# Patient Record
Sex: Male | Born: 1940 | Race: White | Hispanic: No | Marital: Married | Smoking: Current every day smoker
Health system: Southern US, Community
[De-identification: ages and names within clinical notes are randomized; demographics above are authoritative.]

## PROBLEM LIST (undated history)

## (undated) DIAGNOSIS — I219 Acute myocardial infarction, unspecified: Secondary | ICD-10-CM

## (undated) DIAGNOSIS — E119 Type 2 diabetes mellitus without complications: Secondary | ICD-10-CM

## (undated) DIAGNOSIS — J449 Chronic obstructive pulmonary disease, unspecified: Secondary | ICD-10-CM

## (undated) DIAGNOSIS — I10 Essential (primary) hypertension: Secondary | ICD-10-CM

## (undated) DIAGNOSIS — I509 Heart failure, unspecified: Secondary | ICD-10-CM

## (undated) DIAGNOSIS — D49519 Neoplasm of unspecified behavior of unspecified kidney: Secondary | ICD-10-CM

## (undated) DIAGNOSIS — E7800 Pure hypercholesterolemia, unspecified: Secondary | ICD-10-CM

## (undated) DIAGNOSIS — K5792 Diverticulitis of intestine, part unspecified, without perforation or abscess without bleeding: Secondary | ICD-10-CM

## (undated) DIAGNOSIS — Z5189 Encounter for other specified aftercare: Secondary | ICD-10-CM

## (undated) DIAGNOSIS — I2510 Atherosclerotic heart disease of native coronary artery without angina pectoris: Secondary | ICD-10-CM

## (undated) DIAGNOSIS — IMO0001 Reserved for inherently not codable concepts without codable children: Secondary | ICD-10-CM

## (undated) DIAGNOSIS — I214 Non-ST elevation (NSTEMI) myocardial infarction: Secondary | ICD-10-CM

## (undated) DIAGNOSIS — I499 Cardiac arrhythmia, unspecified: Secondary | ICD-10-CM

## (undated) DIAGNOSIS — C801 Malignant (primary) neoplasm, unspecified: Secondary | ICD-10-CM

## (undated) DIAGNOSIS — IMO0002 Reserved for concepts with insufficient information to code with codable children: Secondary | ICD-10-CM

## (undated) DIAGNOSIS — N2889 Other specified disorders of kidney and ureter: Secondary | ICD-10-CM

## (undated) DIAGNOSIS — M1990 Unspecified osteoarthritis, unspecified site: Secondary | ICD-10-CM

## (undated) HISTORY — PX: HERNIA REPAIR: SHX51

## (undated) HISTORY — DX: Chronic obstructive pulmonary disease, unspecified: J44.9

## (undated) HISTORY — DX: Essential (primary) hypertension: I10

## (undated) HISTORY — PX: CATARACT EXTRACTION W/ INTRAOCULAR LENS  IMPLANT, BILATERAL: SHX1307

## (undated) HISTORY — DX: Diverticulitis of intestine, part unspecified, without perforation or abscess without bleeding: K57.92

## (undated) HISTORY — DX: Atherosclerotic heart disease of native coronary artery without angina pectoris: I25.10

## (undated) HISTORY — DX: Non-ST elevation (NSTEMI) myocardial infarction: I21.4

## (undated) HISTORY — PX: COLON SURGERY: SHX602

## (undated) HISTORY — DX: Other specified disorders of kidney and ureter: N28.89

## (undated) HISTORY — PX: APPENDECTOMY: SHX54

---

## 1959-05-15 DIAGNOSIS — IMO0002 Reserved for concepts with insufficient information to code with codable children: Secondary | ICD-10-CM

## 1959-05-15 HISTORY — DX: Reserved for concepts with insufficient information to code with codable children: IMO0002

## 1994-09-13 DIAGNOSIS — K5792 Diverticulitis of intestine, part unspecified, without perforation or abscess without bleeding: Secondary | ICD-10-CM

## 1994-09-13 HISTORY — DX: Diverticulitis of intestine, part unspecified, without perforation or abscess without bleeding: K57.92

## 1998-06-03 ENCOUNTER — Encounter: Payer: Self-pay | Admitting: Surgery

## 1998-06-06 ENCOUNTER — Ambulatory Visit (HOSPITAL_COMMUNITY): Admission: RE | Admit: 1998-06-06 | Discharge: 1998-06-06 | Payer: Self-pay | Admitting: Surgery

## 2001-01-13 ENCOUNTER — Encounter (INDEPENDENT_AMBULATORY_CARE_PROVIDER_SITE_OTHER): Payer: Self-pay | Admitting: Specialist

## 2001-01-13 ENCOUNTER — Ambulatory Visit (HOSPITAL_COMMUNITY): Admission: RE | Admit: 2001-01-13 | Discharge: 2001-01-13 | Payer: Self-pay | Admitting: Gastroenterology

## 2003-09-14 HISTORY — PX: COLON RESECTION: SHX5231

## 2005-09-13 ENCOUNTER — Emergency Department (HOSPITAL_COMMUNITY): Admission: EM | Admit: 2005-09-13 | Discharge: 2005-09-13 | Payer: Self-pay | Admitting: Emergency Medicine

## 2006-02-28 ENCOUNTER — Encounter: Admission: RE | Admit: 2006-02-28 | Discharge: 2006-02-28 | Payer: Self-pay | Admitting: Family Medicine

## 2010-02-09 ENCOUNTER — Emergency Department (HOSPITAL_COMMUNITY): Admission: EM | Admit: 2010-02-09 | Discharge: 2010-02-09 | Payer: Self-pay | Admitting: Emergency Medicine

## 2010-02-18 ENCOUNTER — Ambulatory Visit (HOSPITAL_COMMUNITY): Admission: RE | Admit: 2010-02-18 | Discharge: 2010-02-18 | Payer: Self-pay | Admitting: Family Medicine

## 2010-10-04 ENCOUNTER — Encounter: Payer: Self-pay | Admitting: Family Medicine

## 2010-11-30 LAB — URINALYSIS, ROUTINE W REFLEX MICROSCOPIC
Bilirubin Urine: NEGATIVE
Glucose, UA: NEGATIVE mg/dL
Nitrite: NEGATIVE
Protein, ur: NEGATIVE mg/dL
Urobilinogen, UA: 0.2 mg/dL (ref 0.0–1.0)

## 2010-11-30 LAB — CBC
HCT: 50.7 % (ref 39.0–52.0)
Hemoglobin: 17.8 g/dL — ABNORMAL HIGH (ref 13.0–17.0)
MCHC: 35 g/dL (ref 30.0–36.0)
MCV: 93.8 fL (ref 78.0–100.0)
Platelets: 141 10*3/uL — ABNORMAL LOW (ref 150–400)
RBC: 5.41 MIL/uL (ref 4.22–5.81)
RDW: 13.6 % (ref 11.5–15.5)
WBC: 7.3 10*3/uL (ref 4.0–10.5)

## 2010-11-30 LAB — POCT I-STAT, CHEM 8
BUN: 15 mg/dL (ref 6–23)
Calcium, Ion: 1.13 mmol/L (ref 1.12–1.32)
TCO2: 28 mmol/L (ref 0–100)

## 2011-01-29 NOTE — Procedures (Signed)
[place]. [facility]  Patient:    [name], [name]                       MRN: [id number] Proc. Date: 01/13/01 Adm. Date:  [id number] Attending:  Nelda Marseille CC:         Abran Cantor. Clovis Riley, M.D.   Procedure Report  PROCEDURE:  Colonoscopy with biopsy.  INDICATION:  Patient with history of diverticular surgery and bleeding in the past, due for repeat screening.  Consent was signed after risks, benefits, methods, and options thoroughly discussed multiple times in the past.  MEDICATIONS:  Demerol 100 mg, Versed 10 mg.  DESCRIPTION OF PROCEDURE:  Rectal inspection was pertinent for small external hemorrhoids.  Digital exam was negative.  Video pediatric colonoscope was inserted and easily advanced around the colon to the anastomosis and a short way into the terminal ileum.  It was a side-to-side anastomosis.  Other than significant diverticula throughout the colon, no abnormalities were seen on insertion.  The scope was slowly withdrawn.  The prep was adequate.  There was some liquid stool that required washing and suctioning but on slow withdrawal, other than pan-diverticulosis, no abnormalities were seen until back in the rectum a small polyp was seen on retroflexion, which was cold biopsied x 1. The scope was straightened.  No additional findings were seen.  Air was suctioned, scope removed.  The patient tolerated the procedure well.  There was obvious of immediate complications.  ENDOSCOPIC DIAGNOSES: 1. Internal-external hemorrhoids. 2. Tiny rectal polyp, cold biopsied on retroflexion. 3. Pan-diverticulosis. 4. Side-to-side anastomosis of the colon with normal exam to the terminal    ileum.  PLAN:  Await pathology, but probably repeat screening in five to 10 years.  GI follow-up p.r.n., otherwise return care to Dr. Clovis Riley for the customary health care maintenance to include yearly rectals and guaiacs. DD:  01/13/01 TD:  01/14/01 Job:  [id number]

## 2011-06-14 HISTORY — PX: CORONARY ARTERY BYPASS GRAFT: SHX141

## 2011-06-16 ENCOUNTER — Other Ambulatory Visit: Payer: Self-pay | Admitting: Family Medicine

## 2011-06-16 ENCOUNTER — Ambulatory Visit
Admission: RE | Admit: 2011-06-16 | Discharge: 2011-06-16 | Disposition: A | Discharge status: Home / Self Care | Payer: Medicare Other | Source: Ambulatory Visit | Attending: Family Medicine | Admitting: Family Medicine

## 2011-06-16 DIAGNOSIS — R079 Chest pain, unspecified: Secondary | ICD-10-CM

## 2011-06-19 ENCOUNTER — Observation Stay (HOSPITAL_COMMUNITY)
Admission: EM | Admit: 2011-06-19 | Discharge: 2011-06-20 | Disposition: A | Discharge status: Nursing Home (Includes ICF) | Payer: Medicare Other | Source: Home / Self Care | Attending: Emergency Medicine | Admitting: Emergency Medicine

## 2011-06-19 ENCOUNTER — Emergency Department (HOSPITAL_COMMUNITY): Payer: Medicare Other

## 2011-06-19 DIAGNOSIS — Z7982 Long term (current) use of aspirin: Secondary | ICD-10-CM | POA: Insufficient documentation

## 2011-06-19 DIAGNOSIS — I219 Acute myocardial infarction, unspecified: Secondary | ICD-10-CM

## 2011-06-19 DIAGNOSIS — M1990 Unspecified osteoarthritis, unspecified site: Secondary | ICD-10-CM | POA: Insufficient documentation

## 2011-06-19 DIAGNOSIS — R079 Chest pain, unspecified: Secondary | ICD-10-CM | POA: Insufficient documentation

## 2011-06-19 DIAGNOSIS — Z79899 Other long term (current) drug therapy: Secondary | ICD-10-CM | POA: Insufficient documentation

## 2011-06-19 DIAGNOSIS — R7309 Other abnormal glucose: Secondary | ICD-10-CM | POA: Insufficient documentation

## 2011-06-19 DIAGNOSIS — E785 Hyperlipidemia, unspecified: Secondary | ICD-10-CM | POA: Insufficient documentation

## 2011-06-19 DIAGNOSIS — F17200 Nicotine dependence, unspecified, uncomplicated: Secondary | ICD-10-CM | POA: Insufficient documentation

## 2011-06-19 DIAGNOSIS — I10 Essential (primary) hypertension: Secondary | ICD-10-CM | POA: Insufficient documentation

## 2011-06-19 HISTORY — DX: Acute myocardial infarction, unspecified: I21.9

## 2011-06-19 LAB — DIFFERENTIAL
Basophils Absolute: 0 10*3/uL (ref 0.0–0.1)
Basophils Relative: 0 % (ref 0–1)
Eosinophils Absolute: 0 10*3/uL (ref 0.0–0.7)
Eosinophils Relative: 0 % (ref 0–5)
Lymphocytes Relative: 13 % (ref 12–46)
Lymphs Abs: 0.8 10*3/uL (ref 0.7–4.0)
Monocytes Absolute: 0.1 10*3/uL (ref 0.1–1.0)
Monocytes Relative: 2 % — ABNORMAL LOW (ref 3–12)
Neutro Abs: 5.3 10*3/uL (ref 1.7–7.7)
Neutrophils Relative %: 85 % — ABNORMAL HIGH (ref 43–77)

## 2011-06-19 LAB — BASIC METABOLIC PANEL
BUN: 19 mg/dL (ref 6–23)
CO2: 24 mEq/L (ref 19–32)
Calcium: 9.5 mg/dL (ref 8.4–10.5)
Chloride: 98 mEq/L (ref 96–112)
Creatinine, Ser: 1.13 mg/dL (ref 0.50–1.35)
GFR calc Af Amer: 73 mL/min — ABNORMAL LOW (ref >90)
GFR calc non Af Amer: 63 mL/min — ABNORMAL LOW (ref >90)
Glucose, Bld: 316 mg/dL — ABNORMAL HIGH (ref 70–99)
Potassium: 3.9 mEq/L (ref 3.5–5.1)
Sodium: 136 mEq/L (ref 135–145)

## 2011-06-19 LAB — POCT I-STAT TROPONIN I: Troponin i, poc: 0.08 ng/mL (ref 0.00–0.08)

## 2011-06-19 LAB — CBC
HCT: 49.1 % (ref 39.0–52.0)
Hemoglobin: 17.5 g/dL — ABNORMAL HIGH (ref 13.0–17.0)
MCH: 31.5 pg (ref 26.0–34.0)
MCHC: 35.6 g/dL (ref 30.0–36.0)
MCV: 88.3 fL (ref 78.0–100.0)
Platelets: 142 10*3/uL — ABNORMAL LOW (ref 150–400)
RBC: 5.56 MIL/uL (ref 4.22–5.81)
RDW: 13.4 % (ref 11.5–15.5)
WBC: 6.2 10*3/uL (ref 4.0–10.5)

## 2011-06-19 LAB — GLUCOSE, CAPILLARY: Glucose-Capillary: 339 mg/dL — ABNORMAL HIGH (ref 70–99)

## 2011-06-19 MED ORDER — IOHEXOL 300 MG/ML  SOLN
80.0000 mL | Freq: Once | INTRAMUSCULAR | Status: AC | PRN
  Administered 2011-06-19: 80 mL via INTRAVENOUS

## 2011-06-19 MED ORDER — IOHEXOL 300 MG/ML  SOLN
100.0000 mL | Freq: Once | INTRAMUSCULAR | Status: AC | PRN
  Administered 2011-06-19: 100 mL via INTRAVENOUS

## 2011-06-20 ENCOUNTER — Inpatient Hospital Stay (HOSPITAL_COMMUNITY)
Admission: AD | Admit: 2011-06-20 | Discharge: 2011-07-02 | DRG: 233 | Disposition: A | Discharge status: Home / Self Care | Payer: Medicare Other | Source: Other Acute Inpatient Hospital | Attending: Cardiothoracic Surgery | Admitting: Cardiothoracic Surgery

## 2011-06-20 DIAGNOSIS — I2510 Atherosclerotic heart disease of native coronary artery without angina pectoris: Secondary | ICD-10-CM | POA: Diagnosis present

## 2011-06-20 DIAGNOSIS — E669 Obesity, unspecified: Secondary | ICD-10-CM | POA: Diagnosis present

## 2011-06-20 DIAGNOSIS — M1990 Unspecified osteoarthritis, unspecified site: Secondary | ICD-10-CM | POA: Diagnosis present

## 2011-06-20 DIAGNOSIS — Z7982 Long term (current) use of aspirin: Secondary | ICD-10-CM

## 2011-06-20 DIAGNOSIS — A498 Other bacterial infections of unspecified site: Secondary | ICD-10-CM | POA: Diagnosis not present

## 2011-06-20 DIAGNOSIS — A419 Sepsis, unspecified organism: Secondary | ICD-10-CM | POA: Diagnosis not present

## 2011-06-20 DIAGNOSIS — I214 Non-ST elevation (NSTEMI) myocardial infarction: Principal | ICD-10-CM | POA: Diagnosis present

## 2011-06-20 DIAGNOSIS — F17200 Nicotine dependence, unspecified, uncomplicated: Secondary | ICD-10-CM | POA: Diagnosis present

## 2011-06-20 DIAGNOSIS — D696 Thrombocytopenia, unspecified: Secondary | ICD-10-CM | POA: Diagnosis present

## 2011-06-20 DIAGNOSIS — I10 Essential (primary) hypertension: Secondary | ICD-10-CM | POA: Diagnosis present

## 2011-06-20 DIAGNOSIS — E119 Type 2 diabetes mellitus without complications: Secondary | ICD-10-CM | POA: Diagnosis present

## 2011-06-20 DIAGNOSIS — J449 Chronic obstructive pulmonary disease, unspecified: Secondary | ICD-10-CM | POA: Diagnosis present

## 2011-06-20 DIAGNOSIS — E785 Hyperlipidemia, unspecified: Secondary | ICD-10-CM | POA: Diagnosis present

## 2011-06-20 DIAGNOSIS — J4489 Other specified chronic obstructive pulmonary disease: Secondary | ICD-10-CM | POA: Diagnosis present

## 2011-06-20 DIAGNOSIS — D62 Acute posthemorrhagic anemia: Secondary | ICD-10-CM | POA: Diagnosis not present

## 2011-06-20 LAB — LIPID PANEL
Cholesterol: 132 mg/dL (ref 0–200)
LDL Cholesterol: 76 mg/dL (ref 0–99)
Triglycerides: 147 mg/dL (ref <150)

## 2011-06-20 LAB — COMPREHENSIVE METABOLIC PANEL
ALT: 30 U/L (ref 0–53)
Albumin: 3.5 g/dL (ref 3.5–5.2)
Alkaline Phosphatase: 44 U/L (ref 39–117)
Glucose, Bld: 126 mg/dL — ABNORMAL HIGH (ref 70–99)
Potassium: 4.4 mEq/L (ref 3.5–5.1)
Sodium: 136 mEq/L (ref 135–145)
Total Protein: 7.1 g/dL (ref 6.0–8.3)

## 2011-06-20 LAB — TSH
TSH: 0.625 u[IU]/mL (ref 0.350–4.500)
TSH: 1.118 u[IU]/mL (ref 0.350–4.500)

## 2011-06-20 LAB — CBC
Hemoglobin: 15.9 g/dL (ref 13.0–17.0)
MCV: 90.4 fL (ref 78.0–100.0)
Platelets: 120 10*3/uL — ABNORMAL LOW (ref 150–400)
RBC: 4.93 MIL/uL (ref 4.22–5.81)
RBC: 4.88 MIL/uL (ref 4.22–5.81)
RDW: 14.1 % (ref 11.5–15.5)
WBC: 13.5 10*3/uL — ABNORMAL HIGH (ref 4.0–10.5)
WBC: 7.9 10*3/uL (ref 4.0–10.5)

## 2011-06-20 LAB — HEPARIN LEVEL (UNFRACTIONATED): Heparin Unfractionated: 0.59 IU/mL (ref 0.30–0.70)

## 2011-06-20 LAB — URINALYSIS, ROUTINE W REFLEX MICROSCOPIC
Bilirubin Urine: NEGATIVE
Glucose, UA: NEGATIVE mg/dL
Ketones, ur: NEGATIVE mg/dL
Nitrite: NEGATIVE
Protein, ur: NEGATIVE mg/dL
pH: 7.5 (ref 5.0–8.0)

## 2011-06-20 LAB — BASIC METABOLIC PANEL
Calcium: 8.9 mg/dL (ref 8.4–10.5)
GFR calc non Af Amer: 82 mL/min — ABNORMAL LOW (ref >90)
Glucose, Bld: 114 mg/dL — ABNORMAL HIGH (ref 70–99)
Sodium: 139 mEq/L (ref 135–145)

## 2011-06-20 LAB — CARDIAC PANEL(CRET KIN+CKTOT+MB+TROPI)
CK, MB: 55.2 ng/mL (ref 0.3–4.0)
Relative Index: 10.5 — ABNORMAL HIGH (ref 0.0–2.5)
Relative Index: 10.6 — ABNORMAL HIGH (ref 0.0–2.5)
Total CK: 520 U/L — ABNORMAL HIGH (ref 7–232)
Troponin I: 9.72 ng/mL (ref <0.30)

## 2011-06-20 LAB — HEMOGLOBIN A1C: Hgb A1c MFr Bld: 6.6 % — ABNORMAL HIGH (ref <5.7)

## 2011-06-20 LAB — GLUCOSE, CAPILLARY: Glucose-Capillary: 158 mg/dL — ABNORMAL HIGH (ref 70–99)

## 2011-06-20 LAB — MRSA PCR SCREENING: MRSA by PCR: NEGATIVE

## 2011-06-21 ENCOUNTER — Inpatient Hospital Stay (HOSPITAL_COMMUNITY): Payer: Medicare Other

## 2011-06-21 DIAGNOSIS — I2510 Atherosclerotic heart disease of native coronary artery without angina pectoris: Secondary | ICD-10-CM

## 2011-06-21 LAB — PROTIME-INR: INR: 1.01 (ref 0.00–1.49)

## 2011-06-21 LAB — GLUCOSE, CAPILLARY
Glucose-Capillary: 138 mg/dL — ABNORMAL HIGH (ref 70–99)
Glucose-Capillary: 144 mg/dL — ABNORMAL HIGH (ref 70–99)
Glucose-Capillary: 113 mg/dL — ABNORMAL HIGH (ref 70–99)
Glucose-Capillary: 132 mg/dL — ABNORMAL HIGH (ref 70–99)

## 2011-06-21 LAB — CBC
HCT: 42.9 % (ref 39.0–52.0)
MCH: 31.9 pg (ref 26.0–34.0)
MCV: 88.8 fL (ref 78.0–100.0)
RBC: 4.83 MIL/uL (ref 4.22–5.81)
WBC: 7.3 10*3/uL (ref 4.0–10.5)

## 2011-06-21 NOTE — Consult Note (Signed)
[name], [name]                ACCOUNT NO.:  [id number]  MEDICAL RECORD NO.:  [id number]  LOCATION:  2927                         FACILITY:  [facility]  PHYSICIAN:  Pamella Pert, MD DATE OF BIRTH:  03/31/1941  DATE OF CONSULTATION:  06/20/2011 DATE OF DISCHARGE:                                CONSULTATION   REASON FOR CONSULTATION:  Please evaluate for non-ST-elevation myocardial infarction.  HISTORY:  [name] is a 70 year old fairly active gentleman who was doing well until yesterday around 2 o'clock in the afternoon while he was driving with his wife and had filled up gas in his car, then felt chest heaviness.  Following this, he felt profound diaphoresis and also nausea.  With this ongoing chest discomfort, he presented to [facility] Emergency Department around 4:30 or 5 o'clock in the evening where he had his EKG showed nonspecific T-wave changes.  His troponins were negative for myocardial injury.  He was admitted for unstable angina with rule out myocardial infarction protocol.  At 2 o'clock in the morning, his second set of cardiac markers came positive for non-ST-elevation myocardial infarction.  Hence, he was transferred to [facility] for further evaluation and management.  The patient is presently asymptomatic and states that he is doing fine. He denies any chest pain, denies any shortness of breath.  He slept well last night except for little bit of anxiety, but no PND or orthopnea.  REVIEW OF SYSTEMS:  No history of recent GI bleed.  He has had past history of GI bleed with partial colectomy secondary to acute diverticulitis.  He is not a known diabetic.  He has no recent weight gain or weight loss.  He denies symptoms suggestive of sleep apnea. Denies symptoms suggestive of claudication.  No TIA or neurological deficits.  Other systems are negative.  He denies symptoms to suggest any depression or anxiety.  PAST MEDICAL HISTORY:  Significant  for diverticulitis and history of what appears that partial colectomy sometime in 1997.  Last colonoscopy was in 2002 which had revealed a small rectal polyp, but otherwise pan- diverticulosis without any active GI bleed.  Past medical history is also significant for hypertension and hyperlipidemia.  He had remote bilateral inguinal hernia repair.  SOCIAL HISTORY:  He is married.  He has sedentary lifestyle.  He smokes about half a pack of cigarettes per day for the last 30-35 years.  No alcohol use.  No history of illicit drug use.  FAMILY HISTORY:  There is no history of premature coronary artery disease or diabetes in family.  HOME MEDICATIONS: 1. Gemfibrozil 600 mg 2 tablets p.o. daily. 2. Lisinopril 20 mg p.o. daily. 3. Lovaza 1 g 4 tablets p.o. daily. 4. Zocor 40 mg p.o. nightly. 5. He was recently started on prednisone 20 mg on a tapered dose     presently takes 20 mg once a day.  ALLERGIES:  No known drug allergies.  PHYSICAL EXAMINATION:  GENERAL:  He is well-built and obese.  He appears to be in no acute distress. VITAL SIGNS:  Temperature of 97.7, pulse is 82 beats per minute and regular, respirations 16, and blood pressure 129/79 mmHg. CARDIAC:  S1 and S2 is normal without any gallop or murmur.  Distant heart sounds. CHEST:  Bilateral equal breath sounds, occasionally scattered rhonchi. ABDOMEN:  Soft and obese.  No obvious organomegaly.  No tenderness. Soft.  Bowel sounds heard in all 4 quadrants.  Chest x-ray done on June 19, 2011, revealed bilateral scarring or atelectasis.  Mild chronic interstitial lung disease is evident.  This probably may suggest COPD changes.  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  The brain showed mild age-related atrophy.  A CT angiography of  the chest that was done yesterday reveals no pulmonary emboli.  No evidence of aneurysm.  There is mild bibasilar atelectasis.  His total cholesterol as of June 20, 2011 revealed a total cholesterol of 132, triglycerides 147, HDL 27, LDL 76, and non HDL was 105.  His peak CPK was 573/MB 62/index 10.5.  His troponin has risen from 0.08- 11.01 and then 13.7.  CBC revealed hemoglobin of 15.9, hematocrit of 44.1, platelet count is reduced at 128.  On admission it was 142.  White count is normal at 13.5.  Initial hemoglobin was 17.5.  EKG demonstrates sinus rhythm with right bundle-branch block and nonspecific T-wave changes.  IMPRESSION: 1. Non-ST-elevation myocardial infarction.  The patient is presently     asymptomatic with regards to no chest pain, no shortness of breath.     No evidence of congestive heart failure on physical examination. 2. Hypertension, controlled. 3. Hyperlipidemia, mixed controlled. 4. I suspect he probably has chronic obstructive pulmonary disease     with bilateral basilar atelectasis and scarring of his lungs.  He     was also recently started on steroids on a tapered dose, but     presently taking 20 mg once a day. 5. Hyperglycemia without the diagnosis of diabetes which is new.  This     could related to his recent steroid use.  Hemoglobin A1c is     pending.  RECOMMENDATIONS:  I have started the patient on integrellin for 24 hours. He will be taken to the Cardiac Catheterization Lab tomorrow morning. He is presently on appropriate medical therapy including a low-dose of an ACE inhibitor and also a beta-blocker.  His potassium will be replaced.  I discussed the risks, benefits, alternatives to cardiac catheterization with the patient and his family who are present the bedside.  I explained to them regarding less than a percent risk of death, stroke, heart attack, urgent bypass, bleeding, infection, but not been limited to these as a  major complication from the procedure.  The patient is willing to proceed.  He will be set up for the cardiac catheterization in the morning unless he becomes unstable then he be taken on an urgent basis.  I thank the [facility] for asking me to see this pleasant gentleman.     Pamella Pert, MD     JRG/MEDQ  D:  06/20/2011  T:  06/20/2011  Job:  [id number]  cc:   Dr. Enrigue Catena  Electronically Signed by Yates Decamp MD on 06/21/2011 08:46:34 AM

## 2011-06-21 NOTE — H&P (Signed)
  [name], [name]                ACCOUNT NO.:  [id number]  MEDICAL RECORD NO.:  [id number]  LOCATION:  1419                         FACILITY:  [facility]  PHYSICIAN:  Della Goo, M.D. DATE OF BIRTH:  02-11-1941  DATE OF ADMISSION:  06/19/2011 DATE OF DISCHARGE:                             HISTORY & PHYSICAL   ADDENDUM:  PRIMARY CARE PHYSICIAN:  Dr. Lupe Carney  [name] was admitted with chest pain, rule out MI, hypertension, dyslipidemia, and hyperglycemia, most likely secondary to his steroid therapy.  The second set of cardiac enzymes at 11:48 p.m. returned with  an elevation of a total creatinine kinase of 593, a CK-MB of 62.3, a  relative index of 10.5, and troponin level 11.01.  The patient was seen and evaluated by myself and Everett Graff NP, and the patient was without complaints of chest pain.  His medications were modified and the patient was placed on  an IV nitroglycerin drip along with full-dose Lovenox therapy.  He had been on aspirin therapy.  Cardiology was contacted as  well because of theelevation in the enzymesand concerns for a NSTEMI.  Dr. Jacinto Halim, the cardiologist on-call wanted the patient transferred to  [facility] for evaluation and possible cardiac catheterization.  As noted above, the patient is currently pain-free, and he is hemodynamically stable on this transfer.  The patient will be moved to [facility] to a telemetry bed and his vital signs at this time, are a temperature 97.8, blood pressure 152/77, heart rate 78, respirations 18, and O2 saturations are 92% on room air, 2 L nasal cannula oxygen will be ordered.  A repeat EKG will also be performed.  His previous EKG revealed  a normal sinus rhythm and a right bundle-branch block was seen, however, no acute changes were seen.  The patient will be received by the [facility] for [facility] Team 6.  He will resume the orders, which were previously written for admission  with the additions that had been noted above.     Della Goo, M.D.     HJ/MEDQ  D:  06/20/2011  T:  06/20/2011  Job:  [id number]  cc:   Dr. Lupe Carney  Electronically Signed by Della Goo M.D. on 06/21/2011 01:58:10 AM

## 2011-06-22 ENCOUNTER — Inpatient Hospital Stay (HOSPITAL_COMMUNITY): Payer: Medicare Other

## 2011-06-22 DIAGNOSIS — R509 Fever, unspecified: Secondary | ICD-10-CM

## 2011-06-22 DIAGNOSIS — I2510 Atherosclerotic heart disease of native coronary artery without angina pectoris: Secondary | ICD-10-CM

## 2011-06-22 LAB — CBC
HCT: 42.4 % (ref 39.0–52.0)
Hemoglobin: 15.3 g/dL (ref 13.0–17.0)
MCH: 31.8 pg (ref 26.0–34.0)
MCHC: 36.1 g/dL — ABNORMAL HIGH (ref 30.0–36.0)
MCV: 88.1 fL (ref 78.0–100.0)
Platelets: 98 10*3/uL — ABNORMAL LOW (ref 150–400)
RBC: 4.81 MIL/uL (ref 4.22–5.81)
RDW: 13.7 % (ref 11.5–15.5)
WBC: 4.9 10*3/uL (ref 4.0–10.5)

## 2011-06-22 LAB — BASIC METABOLIC PANEL
BUN: 16 mg/dL (ref 6–23)
CO2: 21 mEq/L (ref 19–32)
Calcium: 9 mg/dL (ref 8.4–10.5)
Chloride: 99 mEq/L (ref 96–112)
Creatinine, Ser: 0.82 mg/dL (ref 0.50–1.35)
GFR calc Af Amer: >90 mL/min (ref >90)
GFR calc non Af Amer: 86 mL/min — ABNORMAL LOW (ref >90)
Glucose, Bld: 107 mg/dL — ABNORMAL HIGH (ref 70–99)
Potassium: 3.5 mEq/L (ref 3.5–5.1)
Sodium: 133 mEq/L — ABNORMAL LOW (ref 135–145)

## 2011-06-22 LAB — DIFFERENTIAL
Basophils Absolute: 0 10*3/uL (ref 0.0–0.1)
Basophils Relative: 0 % (ref 0–1)
Eosinophils Absolute: 0 10*3/uL (ref 0.0–0.7)
Eosinophils Relative: 0 % (ref 0–5)
Lymphocytes Relative: 6 % — ABNORMAL LOW (ref 12–46)
Lymphs Abs: 0.3 10*3/uL — ABNORMAL LOW (ref 0.7–4.0)
Monocytes Absolute: 0.1 10*3/uL (ref 0.1–1.0)
Monocytes Relative: 1 % — ABNORMAL LOW (ref 3–12)
Neutro Abs: 4.6 10*3/uL (ref 1.7–7.7)
Neutrophils Relative %: 93 % — ABNORMAL HIGH (ref 43–77)

## 2011-06-22 LAB — APTT: aPTT: 32 seconds (ref 24–37)

## 2011-06-22 LAB — URINALYSIS, ROUTINE W REFLEX MICROSCOPIC
Nitrite: NEGATIVE
Specific Gravity, Urine: 1.026 (ref 1.005–1.030)
Urobilinogen, UA: 0.2 mg/dL (ref 0.0–1.0)
pH: 6 (ref 5.0–8.0)

## 2011-06-22 LAB — GLUCOSE, CAPILLARY
Glucose-Capillary: 110 mg/dL — ABNORMAL HIGH (ref 70–99)
Glucose-Capillary: 150 mg/dL — ABNORMAL HIGH (ref 70–99)
Glucose-Capillary: 116 mg/dL — ABNORMAL HIGH (ref 70–99)
Glucose-Capillary: 187 mg/dL — ABNORMAL HIGH (ref 70–99)

## 2011-06-22 LAB — POCT I-STAT 3, ART BLOOD GAS (G3+)
Acid-base deficit: 1 mmol/L (ref 0.0–2.0)
O2 Saturation: 90 %

## 2011-06-22 LAB — URINE MICROSCOPIC-ADD ON

## 2011-06-22 NOTE — Consult Note (Signed)
NAMEEUGENIO, DOLLINS                ACCOUNT NO.:  [id number]  MEDICAL RECORD NO.:  [id number]  LOCATION:                                 FACILITY:  PHYSICIAN:  Sheliah Plane, MD    DATE OF BIRTH:  Dec 03, 1940  DATE OF CONSULTATION:  06/21/2011 DATE OF DISCHARGE:                                CONSULTATION   REQUESTING PHYSICIAN:  Pamella Pert, MD  FOLLOWUP CARDIOLOGIST:  Pamella Pert, MD  PRIMARY CARE PHYSICIAN:  Dr. Lupe Carney  GASTROINTESTINAL PHYSICIAN:  Dr. Ewing Schlein.  PREVIOUS GENERAL SURGEON:  Dr. Ezzard Standing.  HISTORY OF PRESENT ILLNESS:  The patient is a 70 year old male with no known previous history of coronary artery disease, who presented to the [facility] Emergency Room after episode of new onset of prolonged chest pain.  He noted while pumping gas on the afternoon prior to his admission of developing severe diaphoresis and then 20-30 minutes later, substernal chest pain and nausea.  After 45 minutes to 1 hour, the pain subsided.  [facility].  Initial troponins were negative, however, the second set of enzymes after admission showed a CK of 593 and MB of 62 with a peak troponin of 11.  The patient remained chest pain free after admission and being started on Integrilin and was seen in consultation by Dr. Jacinto Halim who performed a cardiac catheterization by the right radial artery today.  Consult was requested for consideration of coronary artery bypass grafting.  The patient's previous cardiac risk factors include hypertension, hyperlipidemia.  He has no known history of diabetes but his hemoglobin A1c is elevated at 6.1.  He is a long-term smoker, a pack and a half to one pack per day for 52 years.  The patient denies any previous stroke but has had a syncopal episode last year, and MRI of the brain showed an 80% left vertebral stenosis.  The patient denies claudication, denies renal insufficiency.  PAST MEDICAL HISTORY:   Significant for: 1. History of diverticulitis. 2. Syncopal episode with left vertebral stenosis.  PAST SURGICAL HISTORY: 1. Colon resection for bleeding secondary to diverticular disease in     2002. 2. Bilateral inguinal hernia repairs x4. 3. The patient says he has had his appendix and gallbladder removed,     but it is not clear about when this was done.  He has been on a short course of tapered steroid dose for a right flank pain several days prior to the chest pain.  SOCIAL HISTORY:  The patient is married, lives with his wife.  He is retired from the [employer]ilities [employer].  Denies alcohol use.  As noted, he has been a long-term smoker.  FAMILY HISTORY:  The patient's father died at age 34 of "heart problems" but was not specific.  Mother died at an elderly age of Alzheimer's. The patient has 7 siblings, 2 sisters, one who had had a cerebral aneurysm which was repaired and 1 brother died of alcohol-related disease.  The other siblings are all alive without known coronary disease.  MEDICATIONS:  At the time of admission included simvastatin 40 mg a day; prednisone 20 mg,  take one dose; Lovaza, lisinopril 20 mg a day, gemfibrozil 600 mg a day, aspirin 81 mg a day.  ALLERGIES:  None known.  CARDIAC REVIEW OF SYSTEMS:  Positive for chest pain, exertional shortness of breath, syncope.  He denies presyncope, orthopnea, palpitations, resting shortness of breath, or lower extremity edema.  GENERAL REVIEW OF SYSTEMS:  Denies fever chills or night sweats or other constitutional symptoms.  Denies any significant hemoptysis or recent pulmonary infections.  Denies shortness of breath.  GASTROINTESTINAL: He has had no blood in his stool for many years.  As noted above has had history of diverticular disease.  NEUROLOGIC:  Episode of syncope but no further amaurosis or TIAs.  MUSCULOSKELETAL:  Denies any joint discomfort.  GENITOURINARY:  Denies any trouble passing his  urine.  He denies easy bruisability.  Denies psychiatric history.  Other review of systems are negative.  PHYSICAL EXAMINATION:  VITAL SIGNS:  Blood pressure is 123/63, sinus rhythm 77, O2 sats 95%.  He is 6 feet tall, 224 pounds. GENERAL:  The patient is awake, alert, neurologically intact. HEENT:  Pupils equal, round, and reactive to light. NECK:  Without carotid bruits. LUNGS:  Clear bilaterally without wheezing. CARDIAC:  Regular rate and rhythm without murmur or gallop. ABDOMEN:  Benign without palpable masses.  He has a lower abdominal incision that is healed from previous colon resection.  He does note that the colon incision became infected and it has healed by secondary intention.  He has bilateral inguinal hernia incisions. EXTREMITIES:  Lower extremities are with 2+ DP and PT pulses.  He has 1+ edema at both ankles.  LABORATORY FINDINGS:  White count of 7.3, hemoglobin 15, hematocrit 42, platelet count 122, potassium 3.4, creatinine is 0.92, BUN 18.  Cardiac catheterization films are reviewed and discussed with Dr. Jacinto Halim and showed diffuse heavily calcified vessels with significant distal disease but it does appear to be segment of the LAD, possible obtuse marginal and PD and PL branches of the right.  The right has proximal and distal disease.  Overall, ventricular function is preserved.  Chest x-ray shows basilar scarring but without any other abnormalities, without acute disease.  PFT's -severe COPD  IMPRESSION:  The patient with recent for non-ST-elevation myocardial infarction with elevated troponins now improved, with severe three- vessel coronary artery disease, probably new onset of diabetes and long- term smoker. Seveere COPD by PFT done today Risks and options of bypass surgery were discussed with the patient and his family in detail.  The risk of death, infection, stroke, and myocardial infarction, bleeding, blood transfusion all discussed in detail.  The  patient is willing to proceed.  I will tentatively plan for surgery on Wednesday, June 23, 2011.     Sheliah Plane, MD     EG/MEDQ  D:  06/21/2011  T:  06/22/2011  Job:  [id number]  cc:   Pamella Pert, MD L. Lupe Carney, M.D.  Electronically Signed by Sheliah Plane MD on 06/22/2011 02:05:47 PM

## 2011-06-23 ENCOUNTER — Inpatient Hospital Stay (HOSPITAL_COMMUNITY): Payer: Medicare Other

## 2011-06-23 DIAGNOSIS — I2510 Atherosclerotic heart disease of native coronary artery without angina pectoris: Secondary | ICD-10-CM

## 2011-06-23 LAB — CBC
HCT: 39.2 % (ref 39.0–52.0)
MCHC: 35.2 g/dL (ref 30.0–36.0)
Platelets: 95 10*3/uL — ABNORMAL LOW (ref 150–400)
RDW: 14 % (ref 11.5–15.5)
WBC: 14.3 10*3/uL — ABNORMAL HIGH (ref 4.0–10.5)

## 2011-06-23 LAB — COMPREHENSIVE METABOLIC PANEL
ALT: 22 U/L (ref 0–53)
Albumin: 3.1 g/dL — ABNORMAL LOW (ref 3.5–5.2)
Alkaline Phosphatase: 35 U/L — ABNORMAL LOW (ref 39–117)
Calcium: 8.1 mg/dL — ABNORMAL LOW (ref 8.4–10.5)
GFR calc Af Amer: 63 mL/min — ABNORMAL LOW (ref >90)
Glucose, Bld: 115 mg/dL — ABNORMAL HIGH (ref 70–99)
Potassium: 3.9 mEq/L (ref 3.5–5.1)
Sodium: 136 mEq/L (ref 135–145)
Total Protein: 6.1 g/dL (ref 6.0–8.3)

## 2011-06-23 LAB — GLUCOSE, CAPILLARY
Glucose-Capillary: 175 mg/dL — ABNORMAL HIGH (ref 70–99)
Glucose-Capillary: 149 mg/dL — ABNORMAL HIGH (ref 70–99)

## 2011-06-23 LAB — DIFFERENTIAL
Basophils Absolute: 0 10*3/uL (ref 0.0–0.1)
Basophils Relative: 0 % (ref 0–1)
Eosinophils Absolute: 0 10*3/uL (ref 0.0–0.7)
Eosinophils Relative: 0 % (ref 0–5)
Lymphocytes Relative: 6 % — ABNORMAL LOW (ref 12–46)
Monocytes Absolute: 0.9 10*3/uL (ref 0.1–1.0)

## 2011-06-23 LAB — PRO B NATRIURETIC PEPTIDE: Pro B Natriuretic peptide (BNP): 1768 pg/mL — ABNORMAL HIGH (ref 0–125)

## 2011-06-23 LAB — PROCALCITONIN: Procalcitonin: 15.1 ng/mL

## 2011-06-23 LAB — HEPARIN LEVEL (UNFRACTIONATED): Heparin Unfractionated: <0.1 IU/mL — ABNORMAL LOW (ref 0.30–0.70)

## 2011-06-24 ENCOUNTER — Inpatient Hospital Stay (HOSPITAL_COMMUNITY): Payer: Medicare Other

## 2011-06-24 DIAGNOSIS — J449 Chronic obstructive pulmonary disease, unspecified: Secondary | ICD-10-CM

## 2011-06-24 DIAGNOSIS — R0902 Hypoxemia: Secondary | ICD-10-CM

## 2011-06-24 DIAGNOSIS — R7881 Bacteremia: Secondary | ICD-10-CM

## 2011-06-24 DIAGNOSIS — A419 Sepsis, unspecified organism: Secondary | ICD-10-CM

## 2011-06-24 LAB — DIFFERENTIAL
Basophils Absolute: 0 10*3/uL (ref 0.0–0.1)
Basophils Relative: 0 % (ref 0–1)
Lymphocytes Relative: 7 % — ABNORMAL LOW (ref 12–46)
Neutro Abs: 5 10*3/uL (ref 1.7–7.7)
Neutrophils Relative %: 88 % — ABNORMAL HIGH (ref 43–77)

## 2011-06-24 LAB — BASIC METABOLIC PANEL
GFR calc Af Amer: >90 mL/min (ref >90)
GFR calc non Af Amer: 80 mL/min — ABNORMAL LOW (ref >90)
Potassium: 3.4 mEq/L — ABNORMAL LOW (ref 3.5–5.1)
Sodium: 133 mEq/L — ABNORMAL LOW (ref 135–145)

## 2011-06-24 LAB — URINE CULTURE
Colony Count: NO GROWTH
Culture: NO GROWTH

## 2011-06-24 LAB — GLUCOSE, CAPILLARY: Glucose-Capillary: 145 mg/dL — ABNORMAL HIGH (ref 70–99)

## 2011-06-24 LAB — CBC
Hemoglobin: 13.1 g/dL (ref 13.0–17.0)
MCHC: 35 g/dL (ref 30.0–36.0)
RDW: 13.9 % (ref 11.5–15.5)

## 2011-06-24 LAB — PRO B NATRIURETIC PEPTIDE: Pro B Natriuretic peptide (BNP): 1534 pg/mL — ABNORMAL HIGH (ref 0–125)

## 2011-06-25 ENCOUNTER — Inpatient Hospital Stay (HOSPITAL_COMMUNITY): Payer: Medicare Other

## 2011-06-25 DIAGNOSIS — R7881 Bacteremia: Secondary | ICD-10-CM

## 2011-06-25 LAB — CBC
Hemoglobin: 13.6 g/dL (ref 13.0–17.0)
MCH: 30.7 pg (ref 26.0–34.0)
MCV: 87.8 fL (ref 78.0–100.0)
RBC: 4.43 MIL/uL (ref 4.22–5.81)

## 2011-06-25 LAB — CULTURE, BLOOD (ROUTINE X 2): Culture  Setup Time: 201210092131

## 2011-06-25 LAB — DIFFERENTIAL
Basophils Relative: 0 % (ref 0–1)
Lymphs Abs: 0.6 10*3/uL — ABNORMAL LOW (ref 0.7–4.0)
Monocytes Relative: 11 % (ref 3–12)
Neutro Abs: 3.4 10*3/uL (ref 1.7–7.7)
Neutrophils Relative %: 76 % (ref 43–77)

## 2011-06-25 LAB — GLUCOSE, CAPILLARY
Glucose-Capillary: 120 mg/dL — ABNORMAL HIGH (ref 70–99)
Glucose-Capillary: 116 mg/dL — ABNORMAL HIGH (ref 70–99)
Glucose-Capillary: 104 mg/dL — ABNORMAL HIGH (ref 70–99)
Glucose-Capillary: 105 mg/dL — ABNORMAL HIGH (ref 70–99)

## 2011-06-25 LAB — BASIC METABOLIC PANEL
CO2: 26 mEq/L (ref 19–32)
Calcium: 8.7 mg/dL (ref 8.4–10.5)
Chloride: 102 mEq/L (ref 96–112)
Glucose, Bld: 125 mg/dL — ABNORMAL HIGH (ref 70–99)
Sodium: 137 mEq/L (ref 135–145)

## 2011-06-25 MED ORDER — IOHEXOL 300 MG/ML  SOLN
100.0000 mL | Freq: Once | INTRAMUSCULAR | Status: AC | PRN
  Administered 2011-06-25: 100 mL via INTRAVENOUS

## 2011-06-26 DIAGNOSIS — I2510 Atherosclerotic heart disease of native coronary artery without angina pectoris: Secondary | ICD-10-CM

## 2011-06-26 LAB — CROSSMATCH
ABO/RH(D): O POS
Antibody Screen: NEGATIVE
Unit division: 0
Unit division: 0

## 2011-06-26 LAB — CBC
HCT: 38.5 % — ABNORMAL LOW (ref 39.0–52.0)
Hemoglobin: 13.8 g/dL (ref 13.0–17.0)
MCH: 31.8 pg (ref 26.0–34.0)
MCHC: 35.8 g/dL (ref 30.0–36.0)
MCV: 88.7 fL (ref 78.0–100.0)
RDW: 14 % (ref 11.5–15.5)

## 2011-06-26 LAB — CULTURE, BLOOD (ROUTINE X 2): Culture  Setup Time: 201210092131

## 2011-06-26 LAB — GLUCOSE, CAPILLARY

## 2011-06-27 LAB — GLUCOSE, CAPILLARY
Glucose-Capillary: 107 mg/dL — ABNORMAL HIGH (ref 70–99)
Glucose-Capillary: 100 mg/dL — ABNORMAL HIGH (ref 70–99)
Glucose-Capillary: 132 mg/dL — ABNORMAL HIGH (ref 70–99)

## 2011-06-27 LAB — CBC
HCT: 40.2 % (ref 39.0–52.0)
MCV: 87.8 fL (ref 78.0–100.0)
RBC: 4.58 MIL/uL (ref 4.22–5.81)
WBC: 4.3 10*3/uL (ref 4.0–10.5)

## 2011-06-28 ENCOUNTER — Inpatient Hospital Stay (HOSPITAL_COMMUNITY): Payer: Medicare Other

## 2011-06-28 ENCOUNTER — Encounter: Payer: Self-pay | Admitting: Cardiothoracic Surgery

## 2011-06-28 DIAGNOSIS — N2889 Other specified disorders of kidney and ureter: Secondary | ICD-10-CM | POA: Insufficient documentation

## 2011-06-28 DIAGNOSIS — I2510 Atherosclerotic heart disease of native coronary artery without angina pectoris: Secondary | ICD-10-CM

## 2011-06-28 LAB — POCT I-STAT 4, (NA,K, GLUC, HGB,HCT)
Glucose, Bld: 114 mg/dL — ABNORMAL HIGH (ref 70–99)
Glucose, Bld: 109 mg/dL — ABNORMAL HIGH (ref 70–99)
Glucose, Bld: 132 mg/dL — ABNORMAL HIGH (ref 70–99)
HCT: 28 % — ABNORMAL LOW (ref 39.0–52.0)
HCT: 29 % — ABNORMAL LOW (ref 39.0–52.0)
Hemoglobin: 13.6 g/dL (ref 13.0–17.0)
Hemoglobin: 11.6 g/dL — ABNORMAL LOW (ref 13.0–17.0)
Hemoglobin: 9.5 g/dL — ABNORMAL LOW (ref 13.0–17.0)
Hemoglobin: 9.9 g/dL — ABNORMAL LOW (ref 13.0–17.0)
Hemoglobin: 11.9 g/dL — ABNORMAL LOW (ref 13.0–17.0)
Potassium: 3.3 mEq/L — ABNORMAL LOW (ref 3.5–5.1)
Potassium: 4.3 mEq/L (ref 3.5–5.1)
Sodium: 139 mEq/L (ref 135–145)
Sodium: 138 mEq/L (ref 135–145)

## 2011-06-28 LAB — CBC
HCT: 42.3 % (ref 39.0–52.0)
HCT: 33.3 % — ABNORMAL LOW (ref 39.0–52.0)
HCT: 36.6 % — ABNORMAL LOW (ref 39.0–52.0)
Hemoglobin: 11.8 g/dL — ABNORMAL LOW (ref 13.0–17.0)
Hemoglobin: 13 g/dL (ref 13.0–17.0)
MCH: 31 pg (ref 26.0–34.0)
MCH: 31.3 pg (ref 26.0–34.0)
MCHC: 35.4 g/dL (ref 30.0–36.0)
MCHC: 35.5 g/dL (ref 30.0–36.0)
MCV: 87.8 fL (ref 78.0–100.0)
MCV: 87.4 fL (ref 78.0–100.0)
MCV: 88 fL (ref 78.0–100.0)
Platelets: 119 10*3/uL — ABNORMAL LOW (ref 150–400)
Platelets: 71 10*3/uL — ABNORMAL LOW (ref 150–400)
Platelets: 107 10*3/uL — ABNORMAL LOW (ref 150–400)
RBC: 4.82 MIL/uL (ref 4.22–5.81)
RBC: 3.81 MIL/uL — ABNORMAL LOW (ref 4.22–5.81)
RBC: 4.16 MIL/uL — ABNORMAL LOW (ref 4.22–5.81)
RDW: 13.8 % (ref 11.5–15.5)
RDW: 14 % (ref 11.5–15.5)
WBC: 6.9 10*3/uL (ref 4.0–10.5)
WBC: 12 10*3/uL — ABNORMAL HIGH (ref 4.0–10.5)
WBC: 13.3 10*3/uL — ABNORMAL HIGH (ref 4.0–10.5)

## 2011-06-28 LAB — POCT I-STAT 3, ART BLOOD GAS (G3+)
Acid-base deficit: 1 mmol/L (ref 0.0–2.0)
Bicarbonate: 24.5 mEq/L — ABNORMAL HIGH (ref 20.0–24.0)
Bicarbonate: 24.7 mEq/L — ABNORMAL HIGH (ref 20.0–24.0)
O2 Saturation: 100 %
O2 Saturation: 94 %
O2 Saturation: 95 %
Patient temperature: 36.9
Patient temperature: 36.9
TCO2: 26 mmol/L (ref 0–100)
pCO2 arterial: 44.1 mmHg (ref 35.0–45.0)
pH, Arterial: 7.396 (ref 7.350–7.450)
pH, Arterial: 7.393 (ref 7.350–7.450)
pO2, Arterial: 323 mmHg — ABNORMAL HIGH (ref 80.0–100.0)
pO2, Arterial: 78 mmHg — ABNORMAL LOW (ref 80.0–100.0)

## 2011-06-28 LAB — HEMOGLOBIN A1C
Hgb A1c MFr Bld: 6.5 % — ABNORMAL HIGH (ref <5.7)
Mean Plasma Glucose: 140 mg/dL — ABNORMAL HIGH (ref <117)

## 2011-06-28 LAB — BLOOD GAS, ARTERIAL
Acid-Base Excess: 0.8 mmol/L (ref 0.0–2.0)
Bicarbonate: 25.1 mEq/L — ABNORMAL HIGH (ref 20.0–24.0)
Drawn by: 31288
O2 Content: 2 L/min
O2 Saturation: 91.3 %
Patient temperature: 98.6
TCO2: 26.3 mmol/L (ref 0–100)
pCO2 arterial: 41 mmHg (ref 35.0–45.0)
pH, Arterial: 7.403 (ref 7.350–7.450)
pO2, Arterial: 64.8 mmHg — ABNORMAL LOW (ref 80.0–100.0)

## 2011-06-28 LAB — CREATININE, SERUM
Creatinine, Ser: 0.71 mg/dL (ref 0.50–1.35)
GFR calc Af Amer: >90 mL/min (ref >90)
GFR calc non Af Amer: >90 mL/min (ref >90)

## 2011-06-28 LAB — MAGNESIUM: Magnesium: 3.1 mg/dL — ABNORMAL HIGH (ref 1.5–2.5)

## 2011-06-28 LAB — GLUCOSE, CAPILLARY
Glucose-Capillary: 141 mg/dL — ABNORMAL HIGH (ref 70–99)
Glucose-Capillary: 119 mg/dL — ABNORMAL HIGH (ref 70–99)
Glucose-Capillary: 132 mg/dL — ABNORMAL HIGH (ref 70–99)

## 2011-06-28 LAB — POCT I-STAT, CHEM 8
Chloride: 107 mEq/L (ref 96–112)
HCT: 35 % — ABNORMAL LOW (ref 39.0–52.0)
Potassium: 4.4 mEq/L (ref 3.5–5.1)

## 2011-06-28 LAB — PROTIME-INR
INR: 1.21 (ref 0.00–1.49)
Prothrombin Time: 15.6 seconds — ABNORMAL HIGH (ref 11.6–15.2)

## 2011-06-28 LAB — APTT: aPTT: 35 seconds (ref 24–37)

## 2011-06-28 LAB — PLATELET COUNT: Platelets: 90 10*3/uL — ABNORMAL LOW (ref 150–400)

## 2011-06-29 ENCOUNTER — Inpatient Hospital Stay (HOSPITAL_COMMUNITY): Payer: Medicare Other

## 2011-06-29 LAB — PREPARE PLATELET PHERESIS

## 2011-06-29 LAB — CBC
HCT: 36.7 % — ABNORMAL LOW (ref 39.0–52.0)
HCT: 38.1 % — ABNORMAL LOW (ref 39.0–52.0)
Hemoglobin: 12.7 g/dL — ABNORMAL LOW (ref 13.0–17.0)
Hemoglobin: 13.3 g/dL (ref 13.0–17.0)
MCH: 30.5 pg (ref 26.0–34.0)
MCH: 31.1 pg (ref 26.0–34.0)
MCHC: 34.6 g/dL (ref 30.0–36.0)
MCHC: 34.9 g/dL (ref 30.0–36.0)
MCV: 88.2 fL (ref 78.0–100.0)
MCV: 89 fL (ref 78.0–100.0)
Platelets: 114 10*3/uL — ABNORMAL LOW (ref 150–400)
Platelets: 136 10*3/uL — ABNORMAL LOW (ref 150–400)
RBC: 4.16 MIL/uL — ABNORMAL LOW (ref 4.22–5.81)
RBC: 4.28 MIL/uL (ref 4.22–5.81)
RDW: 14 % (ref 11.5–15.5)
RDW: 14.4 % (ref 11.5–15.5)
WBC: 10.8 10*3/uL — ABNORMAL HIGH (ref 4.0–10.5)
WBC: 11.7 10*3/uL — ABNORMAL HIGH (ref 4.0–10.5)

## 2011-06-29 LAB — BASIC METABOLIC PANEL
BUN: 11 mg/dL (ref 6–23)
BUN: 14 mg/dL (ref 6–23)
CO2: 23 mEq/L (ref 19–32)
CO2: 27 mEq/L (ref 19–32)
Calcium: 8 mg/dL — ABNORMAL LOW (ref 8.4–10.5)
Calcium: 8.4 mg/dL (ref 8.4–10.5)
Chloride: 106 mEq/L (ref 96–112)
Chloride: 101 mEq/L (ref 96–112)
Creatinine, Ser: 0.67 mg/dL (ref 0.50–1.35)
Creatinine, Ser: 0.92 mg/dL (ref 0.50–1.35)
GFR calc Af Amer: >90 mL/min (ref >90)
GFR calc Af Amer: >90 mL/min (ref >90)
GFR calc non Af Amer: >90 mL/min (ref >90)
GFR calc non Af Amer: 82 mL/min — ABNORMAL LOW (ref >90)
Glucose, Bld: 91 mg/dL (ref 70–99)
Glucose, Bld: 134 mg/dL — ABNORMAL HIGH (ref 70–99)
Potassium: 3.7 mEq/L (ref 3.5–5.1)
Potassium: 4.1 mEq/L (ref 3.5–5.1)
Sodium: 138 mEq/L (ref 135–145)
Sodium: 136 mEq/L (ref 135–145)

## 2011-06-29 LAB — GLUCOSE, CAPILLARY
Glucose-Capillary: 103 mg/dL — ABNORMAL HIGH (ref 70–99)
Glucose-Capillary: 110 mg/dL — ABNORMAL HIGH (ref 70–99)
Glucose-Capillary: 110 mg/dL — ABNORMAL HIGH (ref 70–99)
Glucose-Capillary: 110 mg/dL — ABNORMAL HIGH (ref 70–99)
Glucose-Capillary: 112 mg/dL — ABNORMAL HIGH (ref 70–99)
Glucose-Capillary: 97 mg/dL (ref 70–99)
Glucose-Capillary: 176 mg/dL — ABNORMAL HIGH (ref 70–99)
Glucose-Capillary: 111 mg/dL — ABNORMAL HIGH (ref 70–99)
Glucose-Capillary: 199 mg/dL — ABNORMAL HIGH (ref 70–99)

## 2011-06-29 LAB — MAGNESIUM
Magnesium: 2.6 mg/dL — ABNORMAL HIGH (ref 1.5–2.5)
Magnesium: 2.6 mg/dL — ABNORMAL HIGH (ref 1.5–2.5)

## 2011-06-29 NOTE — Progress Notes (Signed)
[name], [name]                ACCOUNT NO.:  [id number]  MEDICAL RECORD NO.:  [id number]  LOCATION:  2927                         FACILITY:  [facility]  PHYSICIAN:  Jeoffrey Massed, MD    DATE OF BIRTH:  05-23-1941                                PROGRESS NOTE   PRIMARY CARE PRACTITIONER: Dr. Lupe Carney.  CURRENT MEDICAL DIAGNOSES: 1. Non-ST elevated myocardial infarction for CABG sometime next week. 2. Systemic inflammatory response syndrome secondary to gram-negative     bacteremia. 3. Severe chronic obstructive pulmonary disease 4. Newly diagnosed type 2 diabetes. 5. History of hypertension. 6. History of dyslipidemia. 7. Longstanding history of tobacco abuse. 8. Thrombocytopenia.  CONSULTANTS ON THE CASE: 1. Pamella Pert, MD from Cardiology. 2. Sheliah Plane, MD from cardiothoracic surgery.  BRIEF HISTORY OF PRESENT ILLNESS: The patient is a very pleasant 70 year old white male who has a longstanding history of tobacco abuse, history of hypertension and dyslipidemia who was admitted for chest pain on June 19, 2011.  He subsequently had a positive cardiac enzymes.  For further details regarding the history and physical, please see the note that was dictated by Dr. Olena Leatherwood on admission.  PROCEDURES PERFORMED: The patient underwent a left heart catheterization done by Dr. Yates Decamp which showed severe triple-vessel coronary artery disease.  PERTINENT LABORATORY DATA: 1. Blood cultures done on June 22, 2011, was positive for E. coli.     Sensitivities are currently pending. 2. CBC done today shows a WBC of 5.7, hemoglobin of 13.1, hematocrit     of 37.4, MCV of 88.0 and a platelet count of 79. 3. Chemistry shows sodium of 133, potassium of 3.4, chloride of 100     bicarb of 25, glucose of 149, BUN of 16, creatinine of 0.98 and a     calcium of 8.3. 4. Urine culture done on June 22, 2011, is negative.  PERTINENT RADIOLOGICAL STUDIES: 1. CT angiogram of  the chest done on June 19, 2011, showed no     evidence of pulmonary embolism or thoracic aortic dissection. 2. Chest x-ray done on June 24, 2011, showed stable low lung     volumes and pulmonary vascular congestion.  BRIEF HOSPITAL COURSE: 1. Non-STEMI.  The patient presented with chest pain.  He had positive     cardiac markers.  Subsequently, the patient was seen in consult by     Dr. Yates Decamp.  We took the patient for a left heart cardiac     catheterization.  The results of which are noted above.  The     patient was then subsequently seen by Dr. Tyrone Sage from     Cardiothoracic Surgery and was actually scheduled for CABG.     However, the patient developed a febrile illness and that was     subsequently postponed.  In terms of his coronary artery disease     and a non-STEMI, the patient is stable.  He is chest pain-free.  He     is maintained on aspirin, statin and therapeutic dosing of Lovenox.     Dr. Jacinto Halim from Cardiology and Dr. Tyrone Sage continued to follow this     patient. 2. Systemic  inflammatory response syndrome with E. coli bacteremia.     On June 22, 2011, in the afternoon, the patient was found to be     shivering and tachycardiac.  A oral temperature done during that     time showed a fever of more than 101 degrees Fahrenheit.  He had no     apparent foci evident on physical exam.  He was empirically started     on vancomycin and Zosyn and cultures were obtained.  A repeat chest     x-ray was also done.  Subsequent blood cultures have come back     positive for E. coli.  The sensitivities are currently pending.  On     physical exam, the patient has some rales heard on the left base     and although the x-ray is not read as pneumonia, I do suspect the     patient could have some underlying pneumonia at the left base area     as well.  In any event, the patient has been afebrile for the past     48 hours now and he have discontinued his vancomycin and only      continued him on Zosyn.  Further narrowing in adjustment of his     antibiotic will need to be done as soon as his sensitivities from     his blood cultures obtained.  Given his bacteremia, his CABG has     been postponed and he will leave it to the discretion of his     cardiothoracic surgeon regarding the timing of that.  Please also     note, his urine culture is negative.  Please also note, that the     patient has been afebrile.  He did have leukocytosis yesterday.     However, today it is back down to normal and does not have a toxic     appearance and seems to be clinically much better as compared to     the past few days. 3. Thrombocytopenia.  The patient on admission had a platelet count of     142.  Given the fact that he had a non-STEMI, he has been started     on antiplatelet agents as well as anticoagulation.  His platelet     count has slowly decreased to 79,000 today.  He has no evidence of     any purpura or any pettish-like bleeding.  It is thought that his     acute exacerbation of his platelet count is perhaps secondary to     ongoing infection issues.  He is still being continued on Lovenox     for now.  However, if his platelet counts were to decrease further,     then perhaps we might have to rethink whether we need to continue     Lovenox or not.  I will sign off a peripheral blood smear.  It is     highly unlikely that this is heparin-induced thrombocytopenia at     this point in time, as he in fact came in with a low platelet     count.  However, if his platelet counts were to keep on dropping     and be half of what the original value was and perhaps at some     point, a assay for heparin-induced thrombocytopenia may be sent.     Please also note, the review of his labs done in Feb 09, 2010, does  show a platelet count of 141, then as well.  For now, our plans are     to continue monitoring this and taking necessary steps depending     upon his values. 4. New  diagnosis of type 2 diabetes.  The patient has not been started     on any hypoglycemic agents.  His HbA1c is 6.6.  Currently, he is     being maintained on a sliding scale insulin.  Perhaps at discharge,     he might need to be started on metformin. 5. Hypertension.  This is currently controlled with Imdur and     lisinopril. 6. Dyslipidemia.  The patient is to continue his statins and Lovaza.  PHYSICAL EXAMINATION: VITAL SIGNS:  The patient to be afebrile, heart rate of 81, blood pressure of 112/65 and a pulse ox of 95% on 3 L. HEENT:  Atraumatic, normocephalic.  Pupils are equally reactive to light and accommodation. CHEST:  Bilaterally clear to auscultation with the exception of very few left basilar rales. CARDIOVASCULAR:  Heart sounds are regular.  No murmurs heard. ABDOMEN:  Soft, nontender and nondistended. EXTREMITIES:  Warm to touch.  There is no peripheral edema. NEUROLOGY:  The patient is awake and alert and there are no focal neurological deficits.  DISPOSITION: It is felt that this patient is currently stable to be transferred to a telemetry bed.  FOLLOW UP INSTRUCTIONS: 1. The patient will need daily CBCs to check on his platelet count 2. The patient will be eventually scheduled for CABG depending upon     the discretion of Dr. Tyrone Sage and cardiothoracic team 3. Total time spent 45 minutes.     Jeoffrey Massed, MD     SG/MEDQ  D:  06/24/2011  T:  06/24/2011  Job:  [id number]  Electronically Signed by Jeoffrey Massed  on 06/29/2011 04:24:41 PM

## 2011-06-30 ENCOUNTER — Inpatient Hospital Stay (HOSPITAL_COMMUNITY): Payer: Medicare Other

## 2011-06-30 LAB — CBC
HCT: 35.4 % — ABNORMAL LOW (ref 39.0–52.0)
Hemoglobin: 12.2 g/dL — ABNORMAL LOW (ref 13.0–17.0)
MCH: 31 pg (ref 26.0–34.0)
MCHC: 34.5 g/dL (ref 30.0–36.0)
MCV: 90.1 fL (ref 78.0–100.0)
Platelets: 124 10*3/uL — ABNORMAL LOW (ref 150–400)
RBC: 3.93 MIL/uL — ABNORMAL LOW (ref 4.22–5.81)
RDW: 14.3 % (ref 11.5–15.5)
WBC: 10.2 10*3/uL (ref 4.0–10.5)

## 2011-06-30 LAB — CULTURE, BLOOD (ROUTINE X 2)
Culture  Setup Time: 201210112058
Culture: NO GROWTH

## 2011-06-30 LAB — GLUCOSE, CAPILLARY
Glucose-Capillary: 120 mg/dL — ABNORMAL HIGH (ref 70–99)
Glucose-Capillary: 127 mg/dL — ABNORMAL HIGH (ref 70–99)
Glucose-Capillary: 129 mg/dL — ABNORMAL HIGH (ref 70–99)
Glucose-Capillary: 129 mg/dL — ABNORMAL HIGH (ref 70–99)
Glucose-Capillary: 131 mg/dL — ABNORMAL HIGH (ref 70–99)
Glucose-Capillary: 143 mg/dL — ABNORMAL HIGH (ref 70–99)
Glucose-Capillary: 157 mg/dL — ABNORMAL HIGH (ref 70–99)

## 2011-06-30 LAB — BASIC METABOLIC PANEL
BUN: 16 mg/dL (ref 6–23)
CO2: 27 mEq/L (ref 19–32)
Calcium: 8.3 mg/dL — ABNORMAL LOW (ref 8.4–10.5)
Chloride: 101 mEq/L (ref 96–112)
Creatinine, Ser: 0.88 mg/dL (ref 0.50–1.35)
GFR calc Af Amer: >90 mL/min (ref >90)
GFR calc non Af Amer: 84 mL/min — ABNORMAL LOW (ref >90)
Glucose, Bld: 141 mg/dL — ABNORMAL HIGH (ref 70–99)
Potassium: 3.9 mEq/L (ref 3.5–5.1)
Sodium: 135 mEq/L (ref 135–145)

## 2011-06-30 NOTE — Op Note (Signed)
[name], [name]                ACCOUNT NO.:  [id number]  MEDICAL RECORD NO.:  [id number]  LOCATION:                                 FACILITY:  PHYSICIAN:  Sheliah Plane, MD    DATE OF BIRTH:  [birth year]  DATE OF PROCEDURE:  06/28/2011 DATE OF DISCHARGE:                              OPERATIVE REPORT   PREOPERATIVE DIAGNOSIS:  Coronary occlusive disease with recent non-ST- elevation myocardial infarction.  POSTOPERATIVE DIAGNOSIS:  Coronary occlusive disease with recent non-ST- elevation myocardial infarction.  SURGICAL PROCEDURE:  Coronary artery bypass grafting x4 with left internal mammary to the left anterior descending coronary artery, reverse saphenous vein graft to the distal circumflex coronary artery, sequential reverse saphenous vein graft to the posterior descending and posterolateral branches of the right coronary artery with right leg endovein harvesting.  SURGEON:  Sheliah Plane, MD  FIRST ASSISTANT:  Coral Ceo, PA  BRIEF HISTORY:  The patient is a 70 year old male who presented more than a week prior to surgery with new onset of shortness of breath and chest pain.  Cardiac enzymes were positive for subendocardial myocardial infarction.  He was seen by Dr. Jacinto Halim and cardiac catheterization was performed which demonstrated total occlusion of the posterior descending with high-grade proximal right lesion and distal right lesion jeopardizing a large posterolateral branch.  The circumflex was a relatively small vessel with 80% stenosis.  The LAD had diffuse disease proximally, greater than 80%.  Overall ventricular function was preserved.  Consideration for bypass surgery earlier was discussed, however, when the patient was seen, he was having a shaking chill and rigors.  Blood cultures were obtained and grew E. coli and Pseudomonas without any obvious source.  He was treated for 7 days of IV antibiotics and then with repeat blood cultures negative and  without any further symptoms.  A CT scan of the abdomen was performed which showed a 3.5 cm left renal mass highly suspicious for renal cell carcinoma.  His diagnoses were discussed with the patient's family in detail and he was aware of the increased risk of surgical procedure with his significant underlying pulmonary disease and recent infection.  However, his coronary anatomy was critical in nature and symptomatic.  The risk of death, infection, stroke, myocardial infarction, bleeding, blood transfusion were all discussed with the patient in detail and he was agreeable with proceeding.  DESCRIPTION OF PROCEDURE:  With Swan-Ganz, arterial line, and monitors in place, the patient underwent general endotracheal anesthesia without incident.  Skin of chest and legs was prepped with Betadine and draped in the usual sterile manner.  Using the [employer] endovein harvesting system, vein was harvested from the right thigh and calf and was of good quality and caliber.  Median sternotomy was performed.  Left internal mammary artery was dissected down as a pedicle graft.  The distal artery was divided and had good free flow.  Pericardium was opened.  Overall ventricular function appeared preserved.  The patient was systemically heparinized.  Ascending aorta was cannulated, right atrium was cannulated, and aortic root vent cardioplegia needle was introduced into the ascending aorta.  The patient was placed on cardiopulmonary bypass 2.4 L/min/m2.  Sites of anastomosis  were selected and dissected out of the epicardium.  The patient's body temperature cooled to 32 degrees. Aortic crossclamp was applied and 700 mL of cold blood potassium cardioplegia was administered with diastolic arrest of the heart. Myocardial septal temperatures were monitored throughout crossclamp. Attention was first turned to the circumflex coronary artery.  The distal circumflex was relatively small, the vessel was opened and  was 1.3 mm in size.  Using a running 7-0 Prolene, distal anastomosis was performed.  In addition, attention was then turned to the posterior descending and posterolateral branches.  The posterior descending was totally occluded and diffusely diseased.  The vessel was opened, was relatively small, 1 probe passed distally but not proximally.  Using diamond-type side-to-side anastomosis was carried out with a running 8-0 Prolene.  Distal extent of the same vein was then carried to the much larger posterolateral branch of the right coronary artery, which was opened and using a running 7-0 Prolene distal anastomosis was performed. Attention was then turned to the left anterior descending coronary artery and the distal third of the vessel was opened.  This vessel also was very diffusely diseased, 1.5 mm probe passed distally.  Using a running 8-0 Prolene, left internal mammary artery was anastomosed to the left anterior descending coronary artery.  With release of the bulldog on the mammary artery, there was rise in myocardial septal temperature. The bulldog was placed back on the mammary artery and 2 punch aortotomies were performed in the ascending aorta  with the crossclamp still in place.  Each of the 2 vein grafts were anastomosed to the ascending aorta.  Air was evacuated from the ascending aorta and grafts as the bulldog on the mammary artery was removed with prompt rise in myocardial septal temperature.  Aortic crossclamp was removed with total crossclamp time of 84 minutes.  The patient spontaneously converted to a sinus rhythm.  Atrial and ventricular pacing wires were applied.  Graft marker was applied.  Sites of anastomosis were inspected and were free of bleeding.  With the body temperature rewarmed to 37 degrees, he was then ventilated and weaned from cardiopulmonary bypass without difficulty.  He remained hemodynamically stable.  He was decannulated in the usual fashion.   Protamine sulfate was administered.  Because of low platelet count, platelet transfusion was given.  Total pump time was 114 minutes.  Pericardium was loosely reapproximated.  The left pleural tube and Blake mediastinal drain were left in place.  Sternum was closed with #6 stainless steel wire.  Fascia was closed with interrupted 0-Vicryl, running 3-0 Vicryl in subcutaneous tissue, 4-0 subcuticular stitch in skin edges and dry dressings were applied.  Sponge and needle count was reported as correct at the completion of procedure.  The patient tolerated the procedure without obvious complication and was transferred to the Surgical Intensive Care Unit for further postoperative care.     Sheliah Plane, MD     EG/MEDQ  D:  06/29/2011  T:  06/29/2011  Job:  [id number]  cc:   Pamella Pert, MD L. Lupe Carney, M.D.  Electronically Signed by Sheliah Plane MD on 06/30/2011 06:21:02 PM

## 2011-07-01 ENCOUNTER — Inpatient Hospital Stay (HOSPITAL_COMMUNITY): Payer: Medicare Other

## 2011-07-01 LAB — BASIC METABOLIC PANEL
BUN: 18 mg/dL (ref 6–23)
CO2: 25 mEq/L (ref 19–32)
Calcium: 8.4 mg/dL (ref 8.4–10.5)
Chloride: 100 mEq/L (ref 96–112)
Creatinine, Ser: 0.74 mg/dL (ref 0.50–1.35)
GFR calc Af Amer: >90 mL/min (ref >90)
GFR calc non Af Amer: 90 mL/min — ABNORMAL LOW (ref >90)
Glucose, Bld: 108 mg/dL — ABNORMAL HIGH (ref 70–99)
Potassium: 3.6 mEq/L (ref 3.5–5.1)
Sodium: 135 mEq/L (ref 135–145)

## 2011-07-01 LAB — CBC
HCT: 33.6 % — ABNORMAL LOW (ref 39.0–52.0)
Hemoglobin: 11.6 g/dL — ABNORMAL LOW (ref 13.0–17.0)
MCH: 31.1 pg (ref 26.0–34.0)
MCHC: 34.5 g/dL (ref 30.0–36.0)
MCV: 90.1 fL (ref 78.0–100.0)
Platelets: 154 10*3/uL (ref 150–400)
RBC: 3.73 MIL/uL — ABNORMAL LOW (ref 4.22–5.81)
RDW: 14.3 % (ref 11.5–15.5)
WBC: 9.4 10*3/uL (ref 4.0–10.5)

## 2011-07-01 LAB — GLUCOSE, CAPILLARY
Glucose-Capillary: 140 mg/dL — ABNORMAL HIGH (ref 70–99)
Glucose-Capillary: 121 mg/dL — ABNORMAL HIGH (ref 70–99)
Glucose-Capillary: 121 mg/dL — ABNORMAL HIGH (ref 70–99)
Glucose-Capillary: 107 mg/dL — ABNORMAL HIGH (ref 70–99)
Glucose-Capillary: 100 mg/dL — ABNORMAL HIGH (ref 70–99)
Glucose-Capillary: 100 mg/dL — ABNORMAL HIGH (ref 70–99)

## 2011-07-01 LAB — CROSSMATCH
ABO/RH(D): O POS
Antibody Screen: NEGATIVE
Unit division: 0
Unit division: 0

## 2011-07-02 LAB — GLUCOSE, CAPILLARY
Glucose-Capillary: 113 mg/dL — ABNORMAL HIGH (ref 70–99)
Glucose-Capillary: 95 mg/dL (ref 70–99)

## 2011-07-07 NOTE — Consult Note (Signed)
NAMEMINER, KORAL NO.:  [id number]  MEDICAL RECORD NO.:  [id number]  LOCATION:  4714                         FACILITY:  [facility]  PHYSICIAN:  Gardiner Barefoot, MD    DATE OF BIRTH:  1940-09-21  DATE OF CONSULTATION: DATE OF DISCHARGE:                                CONSULTATION   REFERRING PHYSICIAN:  Mauro Kaufmann, MD  REASON FOR CONSULTATION:  Bacteremia.  HISTORY OF PRESENT ILLNESS:  This is a 70 year old male who presented initially with chest pain and was noted to have non ST elevation MI and did have cardiac catheterization, and due to significant findings, is scheduled to have a CABG.  Unfortunately, his course now has been complicated by bacteremia that was noted in his blood cultures drawn for elevated WBC count.  He does report that an episode of transient rigors that occurred on approximately Tuesday prior to this dictation that was transient, but did resolve.  It was the following day that his blood cultures which were drawn on that day grew out 2 different organisms, 1 being E. coli and the other pseudomonas.  He was then started empirically on vancomycin and Zosyn and most recently, he has been switched to Primaxin.  He has a history of diverticulitis, however, had a colon resection that is approximately 10 years ago and has not had any problems with diverticulitis since.  He denies any abdominal or other pain.  He denies any fever or other problems.  No history of bacteremia that he knows of.  PAST MEDICAL HISTORY: 1. Diverticulitis. 2. Non-ST elevation MI, and now waiting for catheterization. 3. CAD. 4. Dyslipidemia. 5. Hypertension. 6. Type 2 diabetes. 7. History of tobacco abuse. 8. Chronic obstructive pulmonary disease.  MEDICATIONS:  Currently, he is on Primaxin.  ALLERGIES:  No known drug allergies.  FAMILY HISTORY:  Mother with Alzheimer disease and father had heart disease, but lived into their 58s.  SOCIAL HISTORY:  He is an  active smoker, smokes a half pack a day. Denies alcohol.  He is married and his wife is at the bedside.  REVIEW OF SYSTEMS:  A 12-point review of systems was obtained and was negative except as per the history of present illness.  PHYSICAL EXAMINATION:  VITAL SIGNS:  Temperature 97.9, pulse 78, respirations 20, blood pressure is 118/75, and O2 sats 96% on 2 L nasal cannula. GENERAL:  The patient is awake, alert, and oriented x3, appears in no acute distress. CARDIOVASCULAR:  Regular rate and rhythm.  No murmurs, rubs, or gallops. LUNGS:  Clear to auscultation bilaterally. ABDOMEN:  Obese, nontender, nondistended.  Positive bowel sounds.  No hepatosplenomegaly. EXTREMITIES:  No edema.  LABS:  WBC today is 4.5 which is down from his previous elevation of 14.3 with 88% neutrophils.  Creatinine is 0.96.  Blood cultures one shows pseudomonas and the other is E. coli which is resistant to ampicillin and sulbactam, and cefazolin.  Chest x-ray shows atelectasis.  ASSESSMENT AND PLAN:  Polymicrobial bacteremia.  The source of this is unclear.  It would be unlikely to be a cardiac source such as endocarditis despite his cardiac history recently.  More likely, it would be consistent  with a gastroenteral source.  He does have a history of diverticulitis, however, this has been removed.  I will check a CT scan of his abdomen to assure that he does not have any micro perforations or other areas concerning for bacteremia source and I did discuss with him that if nothing is found, it may be a transient event related to his cardiac procedure.  Nonetheless, he will need treatment for 2 weeks for the bacteremia and is on Primaxin now and sensitivities to pseudomonas are pending and can be tapered based on that result once it is available.  Thank you for the consult.     Gardiner Barefoot, MD     RWC/MEDQ  D:  06/25/2011  T:  06/25/2011  Job:  [id number]  Electronically Signed by Staci Righter MD  on 07/07/2011 05:10:29 PM

## 2011-07-09 NOTE — Cardiovascular Report (Signed)
[name], [name]                ACCOUNT NO.:  [id number]  MEDICAL RECORD NO.:  [id number]  LOCATION:  2927                         FACILITY:  [facility]  PHYSICIAN:  Pamella Pert, MD DATE OF BIRTH:  Feb 13, 1941  DATE OF PROCEDURE:  06/21/2011 DATE OF DISCHARGE:                           CARDIAC CATHETERIZATION   PROCEDURES PERFORMED: 1. Left heart catheterization. 2. Selective right and left coronary arteriography.  INDICATION:  [name] is a 70 year old gentleman with history of hypertension, hyperlipidemia, obesity, and smoking who was admitted to the hospital with a non-ST-elevation myocardial infarction.  He is brought to the Cardiac Catheterization lab to evaluate his coronary anatomy.  HEMODYNAMIC DATA:  The left ventricular pressure was 124/9 with end- diastolic pressure of 14 mmHg.  Aortic pressure was 123/63 with a mean of 87 mmHg.  There was no pressure gradient across the aortic valve.  ANGIOGRAPHIC DATA:  Left ventricle:  The left ventricular systolic function is normal with ejection fraction of 55-60%.  There is inferior wall akinesis.  There is no significant mitral regurgitation.  Right coronary artery.  The right coronary artery is a large caliber vessel.  Proximal segment has a 50% stenosis which is eccentric.  At the proximal crux, there is an 80-90% stenosis.  There is a proximal 50% and proximal crux has 80-90% tandem stenosis followed by flush occlusion of the PDA branch.  This PDA branch has collaterals from the left system including circumflex and LAD.  The right coronary artery gives origin to a very large PLA branch.  This PLA branch has a ostial 90% stenosis followed by a mid 90% stenosis.  Left main coronary artery:  The left main coronary artery is large caliber vessel.  There is mild calcification noted.  Distal left main has a 30% stenosis.  Circumflex coronary artery:  The circumflex coronary artery has an ostial 80% stenosis.   There is proximal diffuse severe calcification. Mid segment of the circumflex before the origin of the AV groove has an 80-90% stenosis.  The circumflex continues as a large obtuse marginal 1, which has a tandem 90% stenosis.  The distal bed of this obtuse marginal is graftable and a decent-sized vessel.  LAD:  The LAD is severely calcified diffusely.  There is a ostial 60-70% stenosis followed by a mid heavily calcified 80% stenosis and tandem 90% stenosis.  Mid to distal segment has a 90% stenosis and the apical LAD has a 90% stenosis.  IMPRESSION:  Severe triple-vessel coronary artery disease that will need for coronary artery bypass grafting consideration.  I will discuss the findings with Dr. Sheliah Plane who is on call today.  TECHNIQUE OF PROCEDURE:  Under sterile precautions using a 6-French right radial arterial access, a 6-French TIG #4 catheter was advanced into the ascending aorta.  Then, the selected left and right coronary arteriography was performed.  The catheter then exchanged out of the body over a safety J-wire.  A 5-French pigtail catheter was advanced into the left ventricle and left ventriculography was performed in the RAO and LAO projections.  The catheter then pulled out of the body over a J-wire.  Hemostasis was obtained by applying TR band.  The patient tolerated the procedure well.  A total of 85 mL of contrast was utilized for diagnostic angiography.     Pamella Pert, MD     JRG/MEDQ  D:  06/21/2011  T:  06/21/2011  Job:  [id number]  cc:   L. Lupe Carney, M.D.  Electronically Signed by Yates Decamp MD on 07/09/2011 05:48:51 PM

## 2011-07-12 ENCOUNTER — Other Ambulatory Visit: Payer: Self-pay

## 2011-07-12 DIAGNOSIS — I2510 Atherosclerotic heart disease of native coronary artery without angina pectoris: Secondary | ICD-10-CM | POA: Insufficient documentation

## 2011-07-12 DIAGNOSIS — Z951 Presence of aortocoronary bypass graft: Secondary | ICD-10-CM

## 2011-07-12 MED ORDER — TRAMADOL HCL 50 MG PO TABS: 50.0000 mg | ORAL_TABLET | Freq: Four times a day (QID) | ORAL | Status: DC | PRN

## 2011-07-12 NOTE — Telephone Encounter (Signed)
Refill Rx called to [facility] on [address]. tramadol 50 mg po every  6 hours prn pain. #40/0

## 2011-07-13 NOTE — Discharge Summary (Signed)
[name], [name]                ACCOUNT NO.:  [id number]  MEDICAL RECORD NO.:  [id number]  LOCATION:  2016                         FACILITY:  [facility]  PHYSICIAN:  Sheliah Plane, MD    DATE OF BIRTH:  1941/01/20  DATE OF ADMISSION:  06/20/2011 DATE OF DISCHARGE:  07/02/2011                              DISCHARGE SUMMARY   PRIMARY ADMITTING DIAGNOSIS:  Chest pain.  ADDITIONAL/DISCHARGE DIAGNOSES: 1. Non ST-segment elevation myocardial infarction. 2. Coronary artery disease. 3. Gram-negative bacteremia with blood cultures positive for     Pseudomonas and Escherichia coli. 4. Left renal mass. 5. History of diverticulitis. 6. History of left vertebral stenosis. 7. Severe chronic obstructive pulmonary disease. 8. Newly-diagnosed type 2 diabetes mellitus. 9. Hypertension. 10.History of tobacco abuse. 11.Postoperative acute blood loss anemia.  PROCEDURES PERFORMED: 1. Cardiac catheterization. 2. Coronary artery bypass grafting x4 (left internal mammary artery to     the left anterior descending, saphenous vein graft to the distal     circumflex, sequential saphenous vein graft to the posterior     descending and posterolateral branches of the right coronary     artery). 3. Endoscopic vein harvest, right leg.  HISTORY:  The patient is a 70 year old male with no previous history of coronary artery disease.  He presented to the Emergency Department at [facility] on the date of admission after developing severe diaphoresis and subsequent chest discomfort while pumping gas.  The episode was associated with nausea and ultimately subsided after about an hour.  The patient has sought care in the Emergency Department at [facility] and initial troponins were negative.  However, because of his symptoms and his nonspecific ST wave changes on EKG, it was felt that he should be transferred to [facility] for further evaluation and management.  HOSPITAL COURSE:  [name] was admitted  to [facility], and a second set of cardiac enzymes came back positive for ST-segment elevation myocardial infarction.  He was seen by Cardiology and it was felt that he should undergo cardiac catheterization.  This was performed by Dr. Jacinto Halim on June 21, 2011, and the patient was found to have total occlusion of the posterior descending with high-grade proximal right lesion and distal right lesion jeopardizing a large posterolateral branch.  The circumflex is a relatively small vessel with an 80% stenosis.  The LAD had diffuse disease proximally greater than 80%. Left ventricular ejection fraction was well preserved.  Because of his significant disease and his recent acute MI, it was felt that he should be considered for surgical revascularization.  A Cardiac Surgery consult was obtained and Dr. Sheliah Plane saw the patient, reviewed his films.  He agreed with the need for CABG.  He explained all risks, benefits, and alternatives of surgery to the patient, he agreed to proceed.  The tentative date of surgery was scheduled and the patient began his preoperative workup.  This included carotid Dopplers, which showed no ICA stenosis bilaterally and lower extremity ABIs of greater than 1.0.  During the course of his preoperative workup, the patient developed fevers with shaking chills.  Fever workup revealed possible left pneumonia and ultimately blood cultures were positive for both E.  coli and Pseudomonas.  He was started on appropriate antibiotic therapy. Source for his sepsis could not specifically be pinpointed.  UA and sputum cultures were negative.  ID was consulted to assist with his workup and a 2D echocardiogram was performed to rule out endocarditis. This was negative.  Also in the course of the workup, the patient underwent a CT scan of the abdomen, which showed no evidence for fever source or infection, however, it showed stones in the gallbladder and incidental 3.5-cm left  renal mass.  A CT was reviewed by Dr. Heloise Purpura of Urology and it was felt that the patient should be seen in followup as an outpatient regarding his renal mass and would not require an in-hospital consultation.  His fevers began to trend down and his white count normalized.  Repeat blood cultures were negative on June 27, 2011, and the patient was asymptomatic.  Dr. Tyrone Sage re-evaluated the patient and felt that he should proceed with coronary artery bypass grafting, although he was at increased risk secondary to his recent sepsis.  The patient did agree to proceed.  He was taken to the operating room on June 28, 2011, and underwent CABG x4 as described above.  Please see previously dictated operative report for complete details of surgery.  He tolerated the procedure well and was transferred to the SICU in stable condition for postoperative management.  He was able to be extubated shortly after surgery.  He remained hemodynamically stable and doing well.  By postop day 2, he was ready for transfer to the floor.  His overall postoperative course has been uneventful.  He has been continued on IV antibiotics and from an ID standpoint, has remained stable.  He has been started postoperatively on a beta-blocker, a statin, and an ACE inhibitor, and is tolerating these without difficulty.  He has been somewhat volume overloaded and was started on Lasix to which he is responding well.  He does remain approximately 2 kg above his preoperative weight with some lower extremity edema on physical exam.  His incisions are all healing well.  He has remained afebrile postoperatively and all vital signs have been stable.  His sats have remained greater than 90% on room air.  He has been started on oral hypoglycemic medications for new diagnosis of diabetes mellitus with a hemoglobin A1c on admission of 6.6.  His blood sugars on p.o. meds have remained very stable running between 100-110.  His  most recent labs on postop day 3 showed hemoglobin of 11.6, hematocrit 33.6, platelets 154, white count 9.4, BUN 18, creatinine 0.74.  Chest x-ray shows small bilateral pleural effusions and bibasilar atelectasis.  He has continued to progress well and after evaluation on July 02, 2011, he has been deemed ready for discharge home.  DISCHARGE MEDICATIONS: 1. Cipro 500 mg b.i.d. x7 days. 2. Folic acid 1 mg daily. 3. Lasix 40 mg daily x5 days. 4. Mucinex 600 mg q.12 h. p.r.n. cough. 5. Metformin 500 mg b.i.d. 6. Metoprolol 50 mg b.i.d. 7. Potassium 20 mEq daily x5 days. 8. Ultram 50-100 mg q.4-6 h. p.r.n. pain. 9. Enteric-coated aspirin 325 mg daily. 10.Lisinopril 2.5 mg daily. 11.Artificial Tears 1-2 t.i.d. p.r.n. 12.Gemfibrozil 600 mg b.i.d. 13.Lovaza 1 g 2 tabs b.i.d. 14.Simvastatin 40 mg daily.  DISCHARGE INSTRUCTIONS:  He is asked to refrain from driving, heavy lifting, or strenuous activity.  He may continue ambulating daily and using his incentive spirometer.  He may shower daily and clean his incisions with soap and  water.  He will continue a low-fat, low-sodium diet.  DISCHARGE FOLLOWUP:  He will see Dr. Jacinto Halim in 2 weeks, and may call for this appointment.  He will then follow up with Dr. Dennie Maizes PA in 3 weeks with a chest x-ray.  He will also need to call and schedule an appointment with Dr. Laverle Patter for followup of his left renal mass within the next 3-4 weeks.  In the interim, if he experiences any problems or has any questions, he is asked to contact our office immediately.     Coral Ceo, P.A.   ______________________________ Sheliah Plane, MD    GC/MEDQ  D:  07/02/2011  T:  07/02/2011  Job:  [id number]  cc:   Pamella Pert, MD Heloise Purpura, MD [facility]  Electronically Signed by Weldon Inches. on 07/13/2011 10:09:22 AM Electronically Signed by Sheliah Plane MD on 07/13/2011 05:39:15 PM

## 2011-07-14 NOTE — H&P (Signed)
NAMEMARYLAND, LUPPINO                ACCOUNT NO.:  [id number]  MEDICAL RECORD NO.:  [id number]  LOCATION:  [facility]                         FACILITY:  [facility]  PHYSICIAN:  Ladell Pier, M.D.   DATE OF BIRTH:  19-Dec-1940  DATE OF ADMISSION:  06/19/2011 DATE OF DISCHARGE:                             HISTORY & PHYSICAL   CHIEF COMPLAINT:  Chest pain.  HISTORY OF PRESENT ILLNESS:  The patient is a 70 year old white male with past medical history significant for hypertension, dyslipidemia, arthritis, and tobacco use.  The patient stated that about 2 p.m. today, he had stopped to get gas for his vehicle and when he got back in the car started driving, he felt this heaviness in the center of his chest as if something was sitting on his chest.  It lasted until he got to the emergency department and got nitroglycerin paste, then now it is eased off a little.  He got diaphoretic, felt nauseous, and he vomited up clear fluids.  His right arm felt numb.  Pain was about 8/10.  He has never really had chest pain before.  He stated that he was having pain in his rib area on the right side, went to see his PCP, was diagnosed with costochondritis and placed on a prednisone taper.  He took the 1st dose of the prednisone about 9 a.m. this morning, he only took 1.  PAST MEDICAL HISTORY: 1. Hypertension. 2. Dyslipidemia. 3. Osteoarthritis. 4. History of diverticulitis status post colon resection. 5. Hernia repair surgery x4. 6. Tobacco use.  FAMILY HISTORY:  Mother died at 63, she had Alzheimer disease.  Father died at 69, he had heart disease.  Brother died at 72 from alcohol abuse.  SOCIAL HISTORY:  He smokes half a pack per day.  He does not drink.  He is married.  He has 3 children, 2 daughters and 1 son.  MEDICATIONS: 1. Gemfibrozil 600 mg 2 tablets daily. 2. Lisinopril 20 mg daily. 3. Lovaza 4 g daily. 4. Zocor 40 mg daily. 5. Prednisone taper. 6. Aspirin 81 mg daily.  ALLERGIES:   None.  REVIEW OF SYSTEMS:  Negative, otherwise stated in the HPI.  PHYSICAL EXAMINATION:  VITAL SIGNS:  Temperature 98; pulse of 102; blood pressure was 210/125, is now 156/90; pulse ox 96% on room air. GENERAL:  The patient is sitting up on a stretcher, well-nourished white male. HEENT:  He is normocephalic, atraumatic.  Pupils reactive to light. Throat without erythema. CARDIOVASCULAR:  Regular rate and rhythm. LUNGS:  Clear bilaterally. ABDOMEN:  Positive bowel sounds.  He does have a mid abdominal scar from the abdominal surgery. EXTREMITIES:  1+ edema bilaterally.  IMAGING STUDIES:  CT angio of the chest, no evidence of pulmonary embolism or thoracic artery dilatation or dissection, mild basilar atelectasis, coronary artery disease, mild centrilobar emphysema.  Chest x-ray, bibasilar scarring or atelectasis, suspect mild chronic interstitial lung disease.  EKG per EDP, no acute segment ST elevation or depression, unable to locate EKG.  We will order a repeat one.  LABORATORY DATA:  BMP:  Sodium 136, potassium 3.9, chloride 98, CO2 of 24, glucose 316, BUN 19, creatinine 1.13.  Troponin 0.08.  WBC 6.2, hemoglobin 17.5, MCV 88.3, platelet of 142.  ASSESSMENT AND PLAN: 1. Chest pain, rule out. 2. Hyperglycemia. 3. Hypertension. 4. Hyperlipidemia.  We will admit the patient to the hospital, chest pain rule out.  Cycle cardiac markers, get TSH and fasting lipid panel.  For his hyperglycemia, we will check hemoglobin A1c, but suspect that the increased glucose is probably related to the prednisone.  For hypertension, we will continue him on his home antihypertensive and cholesterol medication and check a fasting lipid panel.  Tobacco use, counseled cessation and he does not require a nicotine patch right now, he declines.  Dr. Donnie Aho is on for [facility].  I suspect that the patient may need an inpatient stress test because he has multiple risk factors, but we will  consult [facility].  We will defer to the rounding physician on consult in [facility] in the morning versus outpatient stress test if he rules out.  Time spent with the patient and the family in doing this admission is approximately 45 minutes.     Ladell Pier, M.D.     [place]/MEDQ  D:  06/19/2011  T:  06/19/2011  Job:  [id number]  cc:   Dr. Wylie Hail  Electronically Signed by Ladell Pier M.D. on 07/14/2011 09:51:52 PM

## 2011-07-21 ENCOUNTER — Other Ambulatory Visit: Payer: Self-pay | Admitting: Cardiothoracic Surgery

## 2011-07-21 DIAGNOSIS — I2510 Atherosclerotic heart disease of native coronary artery without angina pectoris: Secondary | ICD-10-CM

## 2011-07-26 ENCOUNTER — Ambulatory Visit (INDEPENDENT_AMBULATORY_CARE_PROVIDER_SITE_OTHER): Payer: Self-pay | Admitting: Physician Assistant

## 2011-07-26 ENCOUNTER — Ambulatory Visit
Admission: RE | Admit: 2011-07-26 | Discharge: 2011-07-26 | Disposition: A | Discharge status: Home / Self Care | Payer: Medicare Other | Source: Ambulatory Visit | Attending: Cardiothoracic Surgery | Admitting: Cardiothoracic Surgery

## 2011-07-26 ENCOUNTER — Encounter: Payer: Self-pay | Admitting: Physician Assistant

## 2011-07-26 VITALS — BP 107/62 | HR 83 | Resp 20 | Ht 72.0 in | Wt 199.0 lb

## 2011-07-26 DIAGNOSIS — I2510 Atherosclerotic heart disease of native coronary artery without angina pectoris: Secondary | ICD-10-CM

## 2011-07-26 DIAGNOSIS — Z951 Presence of aortocoronary bypass graft: Secondary | ICD-10-CM

## 2011-07-26 NOTE — Progress Notes (Addendum)
HPI: Patient returns for routine postoperative follow-up having undergone CABG x 4 on 06/28/2011. The patient's early postoperative recovery while in the hospital was essentially non remarkable. Since hospital discharge, the patient reports changes in taste and overall fatigue.   Current Outpatient Prescriptions  Medication Sig Dispense Refill  . aspirin 325 MG EC tablet Take 325 mg by mouth daily.        . folic acid (FOLVITE) 1 MG tablet Take 1 mg by mouth daily.        Marland Kitchen gemfibrozil (LOPID) 600 MG tablet Take 600 mg by mouth 2 (two) times daily before a meal.        . guaiFENesin (MUCINEX) 600 MG 12 hr tablet Take 1,200 mg by mouth 2 (two) times daily.        Marland Kitchen lisinopril (PRINIVIL,ZESTRIL) 2.5 MG tablet Take 2.5 mg by mouth daily.        . metformin (FORTAMET) 500 MG (OSM) 24 hr tablet Take 500 mg by mouth 2 (two) times daily with a meal.        . metoprolol (LOPRESSOR) 50 MG tablet Take 50 mg by mouth 2 (two) times daily.        Marland Kitchen omega-3 acid ethyl esters (LOVAZA) 1 G capsule Take 2 g by mouth 2 (two) times daily.        . simvastatin (ZOCOR) 40 MG tablet Take 40 mg by mouth at bedtime.        . traMADol (ULTRAM) 50 MG tablet Take 1 tablet (50 mg total) by mouth every 6 (six) hours as needed for pain. Maximum dose= 8 tablets per day  40 tablet  0   Vital Signs:  BP 107/62, HR 83, RR 20, O2 Sat 94% on room air  Physical Exam:  Cardiovascular: Regular rate and rhythm S1-S2. No murmurs gallops or rubs Pulmonary: Slightly decreased at the right base; left lung clear. No rales wheezes or rhonchi. Abdomen: Soft, nontender, bowel sounds present. Wounds: Sternum is solid; wound clean, dry, well-healed. Right lower extremity wound clean and dry, no drainage or erythema.                 Previous chest tube sites each have an eschar that  was removed. There was no erythema or drainage noted.  Diagnostic Tests:  CXR today shows small right pleural effusion, stable cardiomegaly, no  pneumothorax, resolution of previously seen bibasilar atelectasis.  Impression and Plan:  Overall, patient is surgically stable status post NSTEMI, CABG x4. He has already seen Dr. Jacinto Halim (cardiologist) in follow up and he decreased his Lopressor to 25 mg by mouth 2 times daily. I've instructed the patient that he may stop taking folic acid 30 days from the time of discharge. The patient no longer has a productive cough and may stop taking Mucinex. Patient was instructed to continue taking metformin 500 mg by mouth 2 times daily. His hemoglobin A1c was 6.6 preoperatively and he was instructed to make a followup appointment to see Dr. Clovis Riley regarding further surveillant of his diabetes. He states he does have appointment to see him on 08/30/2011.  All other medications are to be continued. Regarding the patient's complaint of changes in taste, he is not on any medications that would contribute to this. I examined the inside his mouth and he was not found to have thrush or any ulcers. Hopefully, this will resolve in time.  Also, regarding the patient's complaints of fatigue, I explained that this is likely secondary to being  deconditioned after his heart surgery. His stamina and endurance should continue to progress and hopefully again, this will resolve in time as well. Incidentally, patient does have an appointment to see Dr. Laurence Compton on 08/18/2011 regarding further evaluation of the left renal mass. Patient was instructed he may begin driving short distances i.e. 30 minutes or less during the day for the next week; provided he is not taking any narcotics.Marland Kitchen He may then increase his frequency and duration as tolerates. Patient was instructed to continue with sternal precautions i.e. no lifting more than 10 pounds for the next 2-4 weeks. Patient states that he was contacted by cardiac rehabilitation. I've explained that he is surgically cleared to begin participating and encouraged to participate in cardiac  rehab. Patient Tto be seen by Dr. Tyrone Sage on an as needed basis. He will continue to be followed closely by Dr. Jacinto Halim. Patient has an appointment to see him on 08/20/2011

## 2011-08-09 ENCOUNTER — Encounter (HOSPITAL_COMMUNITY): Payer: Medicare Other

## 2011-08-11 ENCOUNTER — Encounter (HOSPITAL_COMMUNITY)
Admission: RE | Admit: 2011-08-11 | Discharge: 2011-08-11 | Disposition: A | Discharge status: Home / Self Care | Payer: Medicare Other | Source: Ambulatory Visit | Attending: Cardiology | Admitting: Cardiology

## 2011-08-11 DIAGNOSIS — E119 Type 2 diabetes mellitus without complications: Secondary | ICD-10-CM | POA: Insufficient documentation

## 2011-08-11 DIAGNOSIS — J4489 Other specified chronic obstructive pulmonary disease: Secondary | ICD-10-CM | POA: Insufficient documentation

## 2011-08-11 DIAGNOSIS — Z7982 Long term (current) use of aspirin: Secondary | ICD-10-CM | POA: Insufficient documentation

## 2011-08-11 DIAGNOSIS — F17200 Nicotine dependence, unspecified, uncomplicated: Secondary | ICD-10-CM | POA: Insufficient documentation

## 2011-08-11 DIAGNOSIS — I214 Non-ST elevation (NSTEMI) myocardial infarction: Secondary | ICD-10-CM | POA: Insufficient documentation

## 2011-08-11 DIAGNOSIS — D696 Thrombocytopenia, unspecified: Secondary | ICD-10-CM | POA: Insufficient documentation

## 2011-08-11 DIAGNOSIS — Z5189 Encounter for other specified aftercare: Secondary | ICD-10-CM | POA: Insufficient documentation

## 2011-08-11 DIAGNOSIS — I4949 Other premature depolarization: Secondary | ICD-10-CM | POA: Insufficient documentation

## 2011-08-11 DIAGNOSIS — I2510 Atherosclerotic heart disease of native coronary artery without angina pectoris: Secondary | ICD-10-CM | POA: Insufficient documentation

## 2011-08-11 DIAGNOSIS — M1990 Unspecified osteoarthritis, unspecified site: Secondary | ICD-10-CM | POA: Insufficient documentation

## 2011-08-11 DIAGNOSIS — Z951 Presence of aortocoronary bypass graft: Secondary | ICD-10-CM | POA: Insufficient documentation

## 2011-08-11 DIAGNOSIS — J449 Chronic obstructive pulmonary disease, unspecified: Secondary | ICD-10-CM | POA: Insufficient documentation

## 2011-08-11 DIAGNOSIS — E669 Obesity, unspecified: Secondary | ICD-10-CM | POA: Insufficient documentation

## 2011-08-11 DIAGNOSIS — I10 Essential (primary) hypertension: Secondary | ICD-10-CM | POA: Insufficient documentation

## 2011-08-11 DIAGNOSIS — E785 Hyperlipidemia, unspecified: Secondary | ICD-10-CM | POA: Insufficient documentation

## 2011-08-11 LAB — GLUCOSE, CAPILLARY: Glucose-Capillary: 137 mg/dL — ABNORMAL HIGH (ref 70–99)

## 2011-08-11 NOTE — Progress Notes (Addendum)
[name] came to exercise at Cardiac Rehab this morning, Frequent PVC's noted, trigeminal PVC's and a couplet.  Patient asymptomatic. Vital signs stable.  [name] said he did not take his medications this morning.  Ecg tracings faxed to [facility] for review. Dr Jacinto Halim reviewed ecg tracings said [name] is okay to go home. No exercise today.  Patient okay to return to exercise Friday.  Dorene Sorrow scheduled for a treadmill at Dr. Verl Dicker office tomorrow.

## 2011-08-12 NOTE — Progress Notes (Signed)
[name] 70 y.o. male       Nutrition Screen                                                                    YES  NO Do you live in a nursing home?  X   Do you eat out more than 3 times/week?    X If yes, how many times per week do you eat out?  Do you have food allergies?   X If yes, what are you allergic to?  Have you gained or lost more than 10 lbs without trying?               X If yes, how much weight have you lost and over what time period?  lbs gained or lost over  weeks/month  Do you want to lose weight?     X If yes, what is a goal weight or amount of weight you would like to lose?  Do you eat alone most of the time?   X   Do you eat less than 2 meals/day?  X If yes, how many meals do you eat?  Do you drink more than 3 alcohol drinks/day?  X If yes, how many drinks per day?  Are you having trouble with constipation? *  X If yes, what are you doing to help relieve constipation?  Do you have financial difficulties with buying food?*    X   Are you experiencing regular nausea/ vomiting?*     X   Do you have a poor appetite? *                                       X    Do you have trouble chewing/swallowing? *   X    Pt with diagnoses of:  X CABG              X MI                          X DM/A1c >6 / CBG >126 X Dyslipidemia  / HDL< 40 / LDL>70 / High TG      X %  Body fat >goal / Body Mass Index >25 X HTN / BP >120/80       Pt Risk Score    5       Diagnosis Risk Score  75       Total Risk Score   80                        X High Risk                Low Risk              HT: 71" Ht Readings from Last 1 Encounters:  07/26/11 6' (1.829 m)    WT: 201.5 lb (91.6 kg) Wt Readings from Last 3 Encounters:  07/26/11 199 lb (90.266 kg)     IBW 78.2 117%IBW BMI 28.2 31.3%body fat  Meds:  Past Medical History  Diagnosis Date  .  CAD (coronary artery disease)   . Acute non-ST segment elevation myocardial infarction   . Left renal mass   . Diverticulitis   . COPD  (chronic obstructive pulmonary disease)   . DM2 (diabetes mellitus, type 2)   . HTN (hypertension)         Activity level: Pt is active  Wt goal:  Wt maintenance due to pt recently quit using tobacco products.  Goal wt: 201.5 lb ( 91.6 kg) Current tobacco use? No     Pt quit tobacco use on 06/19/11 Food/Drug Interaction? No  Labs:  Lipid Panel     Component Value Date/Time   CHOL 132 06/20/2011 0530   TRIG 147 06/20/2011 0530   HDL 27* 06/20/2011 0530   CHOLHDL 4.9 06/20/2011 0530   VLDL 29 06/20/2011 0530   LDLCALC 76 06/20/2011 0530     Lab Results  Component Value Date   HGBA1C 6.5* 06/28/2011    07/01/11 Glucose 108   LDL goal:   < 70      MI, DM, Carotid or PVD and > 2:      tobacco, HTN, HDL, family h/o, lipoprotein a     > 72 yo male or        >72 yo male   Estimated Daily Nutrition Needs for: ? wt maintenance  2350-2550 Kcal , Total Fat 75-80gm, Saturated Fat 18-19 gm, Trans Fat 2.6-2.8 gm,  Sodium less than 1500 mg, Grams of CHO 325

## 2011-08-13 ENCOUNTER — Encounter (HOSPITAL_COMMUNITY)
Admission: RE | Admit: 2011-08-13 | Discharge: 2011-08-13 | Disposition: A | Discharge status: Home / Self Care | Payer: Medicare Other | Source: Ambulatory Visit | Attending: Cardiology | Admitting: Cardiology

## 2011-08-13 LAB — GLUCOSE, CAPILLARY: Glucose-Capillary: 103 mg/dL — ABNORMAL HIGH (ref 70–99)

## 2011-08-13 NOTE — Progress Notes (Signed)
Nutrition Brief Note Spoke with pt.  Pt does not own a glucometer.  Pt states "I don't know if I'm diabetic.  One doctor says yes and another says no."  Pt taking Metformin.  Per discussion with pt, pt overwhelmed with new dx of heart disease, kidney tumor, and diabetes.  Pt states he feels like he'll get a better "handle on things when I know what's going on with my kidney."  Pt reports he has an appt with Dr. Clovis Riley in the middle of December to discuss diabetes.  Per discussion, pt would like to wait until after he has met with Dr. Clovis Riley before he talks with this writer re: diabetes.  Continue client-centered nutrition education by RD as part of interdisciplinary care.  Monitor and evaluate progress toward nutrition goal with team.

## 2011-08-13 NOTE — Progress Notes (Signed)
Pt started cardiac rehab today.  Pt tolerated light exercise without difficulty. Telemetry sinus rhythm with occasional Pvc's.  Ectopy better today.  Cid took his medication this am. .Will continue to monitor the patient throughout  the program.

## 2011-08-16 ENCOUNTER — Encounter (HOSPITAL_COMMUNITY)
Admission: RE | Admit: 2011-08-16 | Discharge: 2011-08-16 | Disposition: A | Discharge status: Home / Self Care | Payer: Medicare Other | Source: Ambulatory Visit | Attending: Cardiology | Admitting: Cardiology

## 2011-08-16 DIAGNOSIS — Z7982 Long term (current) use of aspirin: Secondary | ICD-10-CM | POA: Insufficient documentation

## 2011-08-16 DIAGNOSIS — Z951 Presence of aortocoronary bypass graft: Secondary | ICD-10-CM | POA: Insufficient documentation

## 2011-08-16 DIAGNOSIS — E669 Obesity, unspecified: Secondary | ICD-10-CM | POA: Insufficient documentation

## 2011-08-16 DIAGNOSIS — I2510 Atherosclerotic heart disease of native coronary artery without angina pectoris: Secondary | ICD-10-CM | POA: Insufficient documentation

## 2011-08-16 DIAGNOSIS — J4489 Other specified chronic obstructive pulmonary disease: Secondary | ICD-10-CM | POA: Insufficient documentation

## 2011-08-16 DIAGNOSIS — E785 Hyperlipidemia, unspecified: Secondary | ICD-10-CM | POA: Insufficient documentation

## 2011-08-16 DIAGNOSIS — M1990 Unspecified osteoarthritis, unspecified site: Secondary | ICD-10-CM | POA: Insufficient documentation

## 2011-08-16 DIAGNOSIS — E119 Type 2 diabetes mellitus without complications: Secondary | ICD-10-CM | POA: Insufficient documentation

## 2011-08-16 DIAGNOSIS — I4949 Other premature depolarization: Secondary | ICD-10-CM | POA: Insufficient documentation

## 2011-08-16 DIAGNOSIS — D696 Thrombocytopenia, unspecified: Secondary | ICD-10-CM | POA: Insufficient documentation

## 2011-08-16 DIAGNOSIS — J449 Chronic obstructive pulmonary disease, unspecified: Secondary | ICD-10-CM | POA: Insufficient documentation

## 2011-08-16 DIAGNOSIS — I10 Essential (primary) hypertension: Secondary | ICD-10-CM | POA: Insufficient documentation

## 2011-08-16 DIAGNOSIS — Z5189 Encounter for other specified aftercare: Secondary | ICD-10-CM | POA: Insufficient documentation

## 2011-08-16 DIAGNOSIS — F17200 Nicotine dependence, unspecified, uncomplicated: Secondary | ICD-10-CM | POA: Insufficient documentation

## 2011-08-16 DIAGNOSIS — I214 Non-ST elevation (NSTEMI) myocardial infarction: Secondary | ICD-10-CM | POA: Insufficient documentation

## 2011-08-16 LAB — GLUCOSE, CAPILLARY
Glucose-Capillary: 120 mg/dL — ABNORMAL HIGH (ref 70–99)
Glucose-Capillary: 106 mg/dL — ABNORMAL HIGH (ref 70–99)

## 2011-08-18 ENCOUNTER — Encounter (HOSPITAL_COMMUNITY): Payer: Medicare Other

## 2011-08-20 ENCOUNTER — Encounter (HOSPITAL_COMMUNITY): Payer: Medicare Other

## 2011-08-23 ENCOUNTER — Encounter (HOSPITAL_COMMUNITY): Payer: Medicare Other

## 2011-08-25 ENCOUNTER — Telehealth (HOSPITAL_COMMUNITY): Payer: Self-pay | Admitting: *Deleted

## 2011-08-25 ENCOUNTER — Encounter (HOSPITAL_COMMUNITY): Payer: Medicare Other

## 2011-08-27 ENCOUNTER — Encounter (HOSPITAL_COMMUNITY): Payer: Medicare Other

## 2011-08-30 ENCOUNTER — Encounter (HOSPITAL_COMMUNITY): Payer: Medicare Other

## 2011-09-01 ENCOUNTER — Encounter (HOSPITAL_COMMUNITY): Payer: Medicare Other

## 2011-09-03 ENCOUNTER — Encounter (HOSPITAL_COMMUNITY): Payer: Medicare Other

## 2011-09-08 ENCOUNTER — Encounter (HOSPITAL_COMMUNITY): Payer: Medicare Other

## 2011-09-10 ENCOUNTER — Encounter (HOSPITAL_COMMUNITY): Payer: Medicare Other

## 2011-09-13 ENCOUNTER — Encounter (HOSPITAL_COMMUNITY): Payer: Medicare Other

## 2011-09-15 ENCOUNTER — Encounter (HOSPITAL_COMMUNITY): Payer: Medicare Other

## 2011-09-17 ENCOUNTER — Encounter (HOSPITAL_COMMUNITY): Payer: Medicare Other

## 2011-09-20 ENCOUNTER — Encounter (HOSPITAL_COMMUNITY): Payer: Medicare Other

## 2011-09-22 ENCOUNTER — Encounter (HOSPITAL_COMMUNITY): Payer: Medicare Other

## 2011-09-22 ENCOUNTER — Telehealth (HOSPITAL_COMMUNITY): Payer: Self-pay | Admitting: *Deleted

## 2011-09-22 NOTE — Progress Notes (Addendum)
Cardiac Rehabilitation Program Progress Report   Orientation:  08/04/2011 Discharge Date:  09/22/2011 # of sessions completed: 2/36  Cardiologist: Jacinto Halim Family MD:  Delene Loll Time:  0945  A.  Exercise Program:  Tolerates exercise @ 2.3 METS for 30 minutes, Bike Test Results:  Pre: 0.79 miles, Needs encouragement on exercise program and Discharged  B.  Mental Health:  Good mental attitude  C.  Education/Instruction/Skills  Uses Perceived Exertion Scale and/or Dyspnea Scale    D.  Nutrition/Weight Control/Body Composition: nutrition survey was returned incomplete   *This section completed by Mickle Plumb, Andres Shad, RD, LDN, CDE  E.  Blood Lipids Lab Results  Component Value Date   CHOL 132 06/20/2011   HDL 27* 06/20/2011   LDLCALC 76 06/20/2011   TRIG 147 06/20/2011   CHOLHDL 4.9 06/20/2011    F.  Lifestyle Changes:  Needs physician encouragement on making lifestyle changes  G.  Symptoms noted with exercise:  Asymptomatic  Report Completed By:  Hazle Nordmann   Comments:  Pt dropped after two sessions stating that he had a lot going on right now.  Thanks for the referral. Fabio Pierce, MA, ACSM RCEP

## 2011-09-24 ENCOUNTER — Encounter (HOSPITAL_COMMUNITY): Payer: Medicare Other

## 2011-09-27 ENCOUNTER — Encounter (HOSPITAL_COMMUNITY): Payer: Medicare Other

## 2011-09-29 ENCOUNTER — Encounter (HOSPITAL_COMMUNITY): Payer: Medicare Other

## 2011-10-01 ENCOUNTER — Encounter (HOSPITAL_COMMUNITY): Payer: Medicare Other

## 2011-10-04 ENCOUNTER — Encounter (HOSPITAL_COMMUNITY): Payer: Medicare Other

## 2011-10-06 ENCOUNTER — Encounter (HOSPITAL_COMMUNITY): Payer: Medicare Other

## 2011-10-08 ENCOUNTER — Encounter (HOSPITAL_COMMUNITY): Payer: Medicare Other

## 2011-10-11 ENCOUNTER — Encounter (HOSPITAL_COMMUNITY): Payer: Medicare Other

## 2011-10-13 ENCOUNTER — Encounter (HOSPITAL_COMMUNITY): Payer: Medicare Other

## 2011-10-15 ENCOUNTER — Encounter (HOSPITAL_COMMUNITY): Payer: Medicare Other

## 2011-10-18 ENCOUNTER — Encounter (HOSPITAL_COMMUNITY): Payer: Medicare Other

## 2011-10-20 ENCOUNTER — Encounter (HOSPITAL_COMMUNITY): Payer: Medicare Other

## 2011-10-22 ENCOUNTER — Encounter (HOSPITAL_COMMUNITY): Payer: Medicare Other

## 2011-10-25 ENCOUNTER — Encounter (HOSPITAL_COMMUNITY): Payer: Medicare Other

## 2011-10-27 ENCOUNTER — Other Ambulatory Visit: Payer: Self-pay

## 2011-10-27 ENCOUNTER — Encounter (HOSPITAL_COMMUNITY): Payer: Self-pay | Admitting: *Deleted

## 2011-10-27 ENCOUNTER — Emergency Department (HOSPITAL_COMMUNITY): Payer: Medicare Other

## 2011-10-27 ENCOUNTER — Inpatient Hospital Stay (HOSPITAL_COMMUNITY)
Admission: EM | Admit: 2011-10-27 | Discharge: 2011-10-29 | DRG: 308 | Disposition: A | Discharge status: Home / Self Care | Payer: Medicare Other | Source: Ambulatory Visit | Attending: Cardiology | Admitting: Cardiology

## 2011-10-27 ENCOUNTER — Encounter (HOSPITAL_COMMUNITY): Payer: Medicare Other

## 2011-10-27 DIAGNOSIS — I2510 Atherosclerotic heart disease of native coronary artery without angina pectoris: Secondary | ICD-10-CM | POA: Diagnosis present

## 2011-10-27 DIAGNOSIS — I4892 Unspecified atrial flutter: Principal | ICD-10-CM | POA: Diagnosis present

## 2011-10-27 DIAGNOSIS — E7800 Pure hypercholesterolemia, unspecified: Secondary | ICD-10-CM | POA: Insufficient documentation

## 2011-10-27 DIAGNOSIS — J4489 Other specified chronic obstructive pulmonary disease: Secondary | ICD-10-CM | POA: Diagnosis present

## 2011-10-27 DIAGNOSIS — I252 Old myocardial infarction: Secondary | ICD-10-CM

## 2011-10-27 DIAGNOSIS — E119 Type 2 diabetes mellitus without complications: Secondary | ICD-10-CM | POA: Diagnosis present

## 2011-10-27 DIAGNOSIS — Z7982 Long term (current) use of aspirin: Secondary | ICD-10-CM

## 2011-10-27 DIAGNOSIS — I10 Essential (primary) hypertension: Secondary | ICD-10-CM | POA: Insufficient documentation

## 2011-10-27 DIAGNOSIS — Z87891 Personal history of nicotine dependence: Secondary | ICD-10-CM

## 2011-10-27 DIAGNOSIS — I5033 Acute on chronic diastolic (congestive) heart failure: Secondary | ICD-10-CM | POA: Diagnosis present

## 2011-10-27 DIAGNOSIS — Z79899 Other long term (current) drug therapy: Secondary | ICD-10-CM

## 2011-10-27 DIAGNOSIS — J449 Chronic obstructive pulmonary disease, unspecified: Secondary | ICD-10-CM | POA: Diagnosis present

## 2011-10-27 DIAGNOSIS — E785 Hyperlipidemia, unspecified: Secondary | ICD-10-CM | POA: Diagnosis present

## 2011-10-27 LAB — GLUCOSE, CAPILLARY
Glucose-Capillary: 104 mg/dL — ABNORMAL HIGH (ref 70–99)
Glucose-Capillary: 99 mg/dL (ref 70–99)

## 2011-10-27 LAB — CBC
MCH: 29.3 pg (ref 26.0–34.0)
MCHC: 33.2 g/dL (ref 30.0–36.0)
Platelets: 165 10*3/uL (ref 150–400)
RDW: 15.2 % (ref 11.5–15.5)

## 2011-10-27 LAB — DIFFERENTIAL
Basophils Absolute: 0 10*3/uL (ref 0.0–0.1)
Basophils Relative: 0 % (ref 0–1)
Eosinophils Absolute: 0 10*3/uL (ref 0.0–0.7)
Neutro Abs: 6 10*3/uL (ref 1.7–7.7)
Neutrophils Relative %: 75 % (ref 43–77)

## 2011-10-27 LAB — COMPREHENSIVE METABOLIC PANEL
AST: 16 U/L (ref 0–37)
Albumin: 3.7 g/dL (ref 3.5–5.2)
Alkaline Phosphatase: 36 U/L — ABNORMAL LOW (ref 39–117)
BUN: 17 mg/dL (ref 6–23)
Chloride: 103 mEq/L (ref 96–112)
Potassium: 3.8 mEq/L (ref 3.5–5.1)
Sodium: 137 mEq/L (ref 135–145)
Total Protein: 7.3 g/dL (ref 6.0–8.3)

## 2011-10-27 LAB — CARDIAC PANEL(CRET KIN+CKTOT+MB+TROPI)
Relative Index: INVALID (ref 0.0–2.5)
Total CK: 43 U/L (ref 7–232)

## 2011-10-27 LAB — PRO B NATRIURETIC PEPTIDE: Pro B Natriuretic peptide (BNP): 3397 pg/mL — ABNORMAL HIGH (ref 0–125)

## 2011-10-27 LAB — APTT: aPTT: 32 seconds (ref 24–37)

## 2011-10-27 LAB — PROTIME-INR: Prothrombin Time: 15.2 seconds (ref 11.6–15.2)

## 2011-10-27 MED ORDER — ZOLPIDEM TARTRATE 5 MG PO TABS: 5.0000 mg | ORAL_TABLET | Freq: Every evening | ORAL | Status: DC | PRN

## 2011-10-27 MED ORDER — DILTIAZEM HCL ER 60 MG PO CP12
120.0000 mg | ORAL_CAPSULE | Freq: Two times a day (BID) | ORAL | Status: DC
  Filled 2011-10-27 (×2): qty 2

## 2011-10-27 MED ORDER — POLYVINYL ALCOHOL 1.4 % OP SOLN
1.0000 [drp] | OPHTHALMIC | Status: DC | PRN
  Filled 2011-10-27: qty 15

## 2011-10-27 MED ORDER — METOPROLOL TARTRATE 25 MG PO TABS
25.0000 mg | ORAL_TABLET | Freq: Two times a day (BID) | ORAL | Status: DC
  Administered 2011-10-27 – 2011-10-29 (×5): 25 mg via ORAL
  Filled 2011-10-27 (×6): qty 1

## 2011-10-27 MED ORDER — ALPRAZOLAM 0.5 MG PO TABS
0.5000 mg | ORAL_TABLET | Freq: Three times a day (TID) | ORAL | Status: DC | PRN
  Administered 2011-10-27 – 2011-10-29 (×4): 0.5 mg via ORAL
  Filled 2011-10-27 (×4): qty 1

## 2011-10-27 MED ORDER — DILTIAZEM HCL 25 MG/5ML IV SOLN
INTRAVENOUS | Status: AC
  Administered 2011-10-27: 10 mg via INTRAVENOUS
  Filled 2011-10-27: qty 5

## 2011-10-27 MED ORDER — FUROSEMIDE 10 MG/ML IJ SOLN
40.0000 mg | Freq: Two times a day (BID) | INTRAMUSCULAR | Status: AC
  Administered 2011-10-27 – 2011-10-29 (×4): 40 mg via INTRAVENOUS
  Filled 2011-10-27 (×4): qty 4

## 2011-10-27 MED ORDER — OMEGA-3-ACID ETHYL ESTERS 1 G PO CAPS: 2.0000 g | ORAL_CAPSULE | Freq: Two times a day (BID) | ORAL | Status: DC

## 2011-10-27 MED ORDER — ONE-DAILY MULTI VITAMINS PO TABS: 1.0000 | ORAL_TABLET | Freq: Every day | ORAL | Status: DC

## 2011-10-27 MED ORDER — ASPIRIN EC 81 MG PO TBEC
81.0000 mg | DELAYED_RELEASE_TABLET | Freq: Every day | ORAL | Status: DC
Start: 2011-10-27 — End: 2011-10-29
  Administered 2011-10-28 – 2011-10-29 (×2): 81 mg via ORAL
  Filled 2011-10-27 (×2): qty 1

## 2011-10-27 MED ORDER — DABIGATRAN ETEXILATE MESYLATE 150 MG PO CAPS
150.0000 mg | ORAL_CAPSULE | Freq: Two times a day (BID) | ORAL | Status: DC
  Administered 2011-10-27 – 2011-10-29 (×5): 150 mg via ORAL
  Filled 2011-10-27 (×7): qty 1

## 2011-10-27 MED ORDER — ADULT MULTIVITAMIN W/MINERALS CH
1.0000 | ORAL_TABLET | Freq: Every day | ORAL | Status: DC
Start: 2011-10-27 — End: 2011-10-27
  Administered 2011-10-27: 1 via ORAL
  Filled 2011-10-27: qty 1

## 2011-10-27 MED ORDER — ROSUVASTATIN CALCIUM 5 MG PO TABS
5.0000 mg | ORAL_TABLET | Freq: Every day | ORAL | Status: DC
  Filled 2011-10-27: qty 1

## 2011-10-27 MED ORDER — DILTIAZEM HCL 100 MG IV SOLR
5.0000 mg/h | Freq: Once | INTRAVENOUS | Status: AC
  Administered 2011-10-27: 5 mg/h via INTRAVENOUS

## 2011-10-27 MED ORDER — DILTIAZEM HCL ER 120 MG PO CP24
120.0000 mg | ORAL_CAPSULE | Freq: Two times a day (BID) | ORAL | Status: DC
  Administered 2011-10-27 – 2011-10-29 (×5): 120 mg via ORAL
  Filled 2011-10-27 (×6): qty 1

## 2011-10-27 MED ORDER — ENOXAPARIN SODIUM 100 MG/ML ~~LOC~~ SOLN
90.0000 mg | Freq: Once | SUBCUTANEOUS | Status: DC
  Filled 2011-10-27: qty 1

## 2011-10-27 MED ORDER — METFORMIN HCL ER 500 MG PO TB24
500.0000 mg | ORAL_TABLET | Freq: Two times a day (BID) | ORAL | Status: DC
  Administered 2011-10-28 – 2011-10-29 (×3): 500 mg via ORAL
  Filled 2011-10-27 (×6): qty 1

## 2011-10-27 MED ORDER — FENOFIBRATE 54 MG PO TABS
54.0000 mg | ORAL_TABLET | Freq: Every evening | ORAL | Status: DC
  Administered 2011-10-28: 54 mg via ORAL
  Filled 2011-10-27 (×3): qty 1

## 2011-10-27 MED ORDER — AMIODARONE HCL 200 MG PO TABS
400.0000 mg | ORAL_TABLET | Freq: Three times a day (TID) | ORAL | Status: DC
  Administered 2011-10-27: 400 mg via ORAL
  Filled 2011-10-27 (×2): qty 2

## 2011-10-27 MED ORDER — POLYVINYL ALCOHOL 1.4 % OP SOLN
1.0000 [drp] | OPHTHALMIC | Status: DC | PRN
Start: 2011-10-27 — End: 2011-10-27
  Filled 2011-10-27: qty 15

## 2011-10-27 MED ORDER — ASPIRIN EC 325 MG PO TBEC
325.0000 mg | DELAYED_RELEASE_TABLET | Freq: Every day | ORAL | Status: DC
  Administered 2011-10-27: 325 mg via ORAL
  Filled 2011-10-27: qty 1

## 2011-10-27 MED ORDER — FENOFIBRATE 160 MG PO TABS
160.0000 mg | ORAL_TABLET | Freq: Every day | ORAL | Status: DC
  Administered 2011-10-27: 160 mg via ORAL
  Filled 2011-10-27: qty 1

## 2011-10-27 MED ORDER — FUROSEMIDE 10 MG/ML IJ SOLN
40.0000 mg | Freq: Once | INTRAMUSCULAR | Status: AC
  Administered 2011-10-27: 40 mg via INTRAVENOUS
  Filled 2011-10-27 (×2): qty 4

## 2011-10-27 MED ORDER — METOPROLOL TARTRATE 25 MG PO TABS: 25.0000 mg | ORAL_TABLET | Freq: Two times a day (BID) | ORAL | Status: DC

## 2011-10-27 MED ORDER — THERA M PLUS PO TABS
1.0000 | ORAL_TABLET | Freq: Every day | ORAL | Status: DC
  Filled 2011-10-27: qty 1

## 2011-10-27 MED ORDER — LISINOPRIL 2.5 MG PO TABS: 2.5000 mg | ORAL_TABLET | Freq: Every day | ORAL | Status: DC

## 2011-10-27 MED ORDER — LISINOPRIL 2.5 MG PO TABS
2.5000 mg | ORAL_TABLET | Freq: Every day | ORAL | Status: DC
  Administered 2011-10-27 – 2011-10-29 (×3): 2.5 mg via ORAL
  Filled 2011-10-27 (×3): qty 1

## 2011-10-27 MED ORDER — ADULT MULTIVITAMIN W/MINERALS CH
1.0000 | ORAL_TABLET | Freq: Every day | ORAL | Status: DC
  Administered 2011-10-28: 1 via ORAL
  Filled 2011-10-27 (×2): qty 1

## 2011-10-27 MED ORDER — OMEGA-3-ACID ETHYL ESTERS 1 G PO CAPS
2.0000 g | ORAL_CAPSULE | Freq: Two times a day (BID) | ORAL | Status: DC
  Administered 2011-10-27 – 2011-10-29 (×5): 2 g via ORAL
  Filled 2011-10-27 (×6): qty 2

## 2011-10-27 NOTE — ED Notes (Signed)
Patient with CP and shortness of breath.  Patient states that it started around 1am.  Patient states that the he does not have a history of Atrial Fib.  Patient is CAOx3.

## 2011-10-27 NOTE — H&P (Signed)
[name] is an 71 y.o. male.   Chief Complaint: fatigue and dyspnea HPI: Patient presenting with above symptoms for 1 week to 10 days. No chest pain or palpitations. Found to be in A. Flutter with RVR.   Past Medical History  Diagnosis Date  . CAD (coronary artery disease)   . Acute non-ST segment elevation myocardial infarction   . Left renal mass   . Diverticulitis   . COPD (chronic obstructive pulmonary disease)   . DM2 (diabetes mellitus, type 2)   . HTN (hypertension)     Past Surgical History  Procedure Date  . Coronary artery bypass grafting x4 06/28/11    Dr Tyrone Sage  . Hernia repair surgery x4.     Family History  Problem Relation Age of Onset  . Heart disease Father   . Alzheimer's disease Mother   . Alcohol abuse Brother    Social History:  reports that he quit smoking about 4 months ago. His smoking use included Cigarettes. He has never used smokeless tobacco. He reports that he does not drink alcohol or use illicit drugs.  Allergies: No Known Allergies  Medications Prior to Admission  Medication Dose Route Frequency Provider Last Rate Last Dose  . diltiazem (CARDIZEM) 100 mg in dextrose 5 % 100 mL infusion  5 mg/hr Intravenous Once Geoffery Lyons, MD 5 mL/hr at 10/27/11 0411 5 mg/hr at 10/27/11 0411  . diltiazem (CARDIZEM) 25 MG/5ML injection        10 mg at 10/27/11 0404  . enoxaparin (LOVENOX) injection 90 mg  90 mg Subcutaneous Once Pamella Pert, MD      . furosemide (LASIX) injection 40 mg  40 mg Intravenous Once Pamella Pert, MD       Medications Prior to Admission  Medication Sig Dispense Refill  . aspirin 325 MG EC tablet Take 325 mg by mouth daily.        . fenofibrate (TRICOR) 145 MG tablet Take 145 mg by mouth daily.        Marland Kitchen lisinopril (PRINIVIL,ZESTRIL) 2.5 MG tablet Take 2.5 mg by mouth daily.        . metoprolol (LOPRESSOR) 50 MG tablet Take 25 mg by mouth 2 (two) times daily.       Marland Kitchen omega-3 acid ethyl esters (LOVAZA) 1 G  capsule Take 2 g by mouth 2 (two) times daily.        . metformin (FORTAMET) 500 MG (OSM) 24 hr tablet Take 500 mg by mouth 2 (two) times daily with a meal.          Results for orders placed during the hospital encounter of 10/27/11 (from the past 48 hour(s))  CBC     Status: Normal   Collection Time   10/27/11  4:20 AM      Component Value Range Comment   WBC 8.0  4.0 - 10.5 (K/uL)    RBC 4.67  4.22 - 5.81 (MIL/uL)    Hemoglobin 13.7  13.0 - 17.0 (g/dL)    HCT 16.1  09.6 - 04.5 (%)    MCV 88.4  78.0 - 100.0 (fL)    MCH 29.3  26.0 - 34.0 (pg)    MCHC 33.2  30.0 - 36.0 (g/dL)    RDW 40.9  81.1 - 91.4 (%)    Platelets 165  150 - 400 (K/uL)   DIFFERENTIAL     Status: Normal   Collection Time   10/27/11  4:20 AM  Component Value Range Comment   Neutrophils Relative 75  43 - 77 (%)    Neutro Abs 6.0  1.7 - 7.7 (K/uL)    Lymphocytes Relative 19  12 - 46 (%)    Lymphs Abs 1.5  0.7 - 4.0 (K/uL)    Monocytes Relative 5  3 - 12 (%)    Monocytes Absolute 0.4  0.1 - 1.0 (K/uL)    Eosinophils Relative 0  0 - 5 (%)    Eosinophils Absolute 0.0  0.0 - 0.7 (K/uL)    Basophils Relative 0  0 - 1 (%)    Basophils Absolute 0.0  0.0 - 0.1 (K/uL)   COMPREHENSIVE METABOLIC PANEL     Status: Abnormal   Collection Time   10/27/11  4:20 AM      Component Value Range Comment   Sodium 137  135 - 145 (mEq/L)    Potassium 3.8  3.5 - 5.1 (mEq/L)    Chloride 103  96 - 112 (mEq/L)    CO2 23  19 - 32 (mEq/L)    Glucose, Bld 122 (*) 70 - 99 (mg/dL)    BUN 17  6 - 23 (mg/dL)    Creatinine, Ser 4.54  0.50 - 1.35 (mg/dL)    Calcium 9.4  8.4 - 10.5 (mg/dL)    Total Protein 7.3  6.0 - 8.3 (g/dL)    Albumin 3.7  3.5 - 5.2 (g/dL)    AST 16  0 - 37 (U/L)    ALT 12  0 - 53 (U/L)    Alkaline Phosphatase 36 (*) 39 - 117 (U/L)    Total Bilirubin 0.3  0.3 - 1.2 (mg/dL)    GFR calc non Af Amer 62 (*) >90 (mL/min)    GFR calc Af Amer 72 (*) >90 (mL/min)   APTT     Status: Normal   Collection Time   10/27/11   4:20 AM      Component Value Range Comment   aPTT 32  24 - 37 (seconds)   PROTIME-INR     Status: Normal   Collection Time   10/27/11  4:20 AM      Component Value Range Comment   Prothrombin Time 15.2  11.6 - 15.2 (seconds)    INR 1.18  0.00 - 1.49    CARDIAC PANEL(CRET KIN+CKTOT+MB+TROPI)     Status: Normal   Collection Time   10/27/11  4:21 AM      Component Value Range Comment   Total CK 43  7 - 232 (U/L)    CK, MB 3.7  0.3 - 4.0 (ng/mL)    Troponin I <0.30  <0.30 (ng/mL)    Relative Index RELATIVE INDEX IS INVALID  0.0 - 2.5     Dg Chest Port 1 View  10/27/2011  *RADIOLOGY REPORT*  Clinical Data: Shortness of breath.  History of hypertension.  PORTABLE CHEST - 1 VIEW  Comparison: 07/26/2011  Findings: Stable appearance of postoperative changes in the mediastinum.  Shallow inspiration.  Increasing heart size and pulmonary vascularity since previous study suggesting pulmonary vascular congestion.  Interstitial edema.  Superimposed opacity in the right upper lung could represent superimposed pneumonia or progressive edema.  No blunting of costophrenic angles.  No pneumothorax.  IMPRESSION: Increasing heart size and pulmonary vascular congestion since previous study with evidence of perihilar edema.  Focal edema or consolidation in the right upper lungs.  Original Report Authenticated By: Marlon Pel, M.D.   ROS: Arthritis-yes (hands), life-style limiting-no;  Cramping of the legs and feet at night or with activity-no; Diabetes-no;  Hypothyroidism-no;  Previous Stroke-no, Previous GI Bleed-h/o stomach ulcers ,  Recent weight change-has lost 25 lbs.   since MI on 06/19/11.   Other systems negative.      Blood pressure 142/82, pulse 84, temperature 98 F (36.7 C), temperature source Oral, resp. rate 10, height 6' (1.829 m), weight 95 kg (209 lb 7 oz), SpO2 97.00%. General appearance: alert, cooperative, appears stated age and mildly obese Eyes: conjunctivae/corneas clear. PERRL, EOM's  intact. Fundi benign. Neck: no adenopathy, no carotid bruit, no JVD, supple, symmetrical, trachea midline and thyroid not enlarged, symmetric, no tenderness/mass/nodules Neck: JVP - normal, carotids 2+= without bruits Resp: clear to auscultation bilaterally Chest wall: no tenderness Cardio: regular rate and rhythm, S1, S2 normal, no murmur, click, rub or gallop GI: soft, non-tender; bowel sounds normal; no masses,  no organomegaly Extremities: extremities normal, atraumatic, no cyanosis or edema Pulses: 2+ and symmetric Skin: Skin color, texture, turgor normal. No rashes or lesions Neurologic: Grossly normal   Assessment/Plan  1. Paroxysmal A. Flutter with RVR. On Telemetry I thought patient converted to sinus rhythm while I was examining him, but EKG shows A. Fllutter with with 3:1 conduction and  controlled ventricular response.  2.   CAD/ASHD  H/O  NSTEMI S/P CABGs on 06/28/2011.   LIMA to LAD, SVG to circumflex, sequential SVG to PDA and PL of RCA.   Ofilia Neas, MD: Doing well and on appropriate medical therapy.   No angina, dyspnea or CHF on exam.   Stress EKG 08/12/11 Frequent PVC: rest, initial exercise, recovery. Less at peak. 7.3 MET. Rehab OK 3.   Hypertension at goal.    4.   Hyperlipidemia (reduced HDL)    5.   Diabetes Mellitus II new onset since admssion to the hospital with NQMI.    6.   Severe COPD due to tobacco use  CABG.  Has started to smoke again. Reiterated cessation and abstinence.  7.   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   I do not see any contraindications for him having any exploratory laparatomy if needed at any time now.  Plan: I will start the patient on Pradaxa 150 mg by mouth twice a day along with by mouth Cardizem CD 120 mg every twice a day and stop his IV Cardizem.  I would rule him out for myocardial infarction.  Will check a BMP and CBC in the morning.  I will also obtain proBNP.  Further  recommendations to follow depending on his clinical status over the next 24-48 hours. Addendum: Because of persistent A. Flutter duration unknown, I have stopped Amiodarone until he is adequately anticoagulated. Recheck BNP in am.  Pamella Pert, MD 10/27/2011, 8:18 AM

## 2011-10-27 NOTE — ED Provider Notes (Signed)
History     CSN: [id number]  Arrival date & time 10/27/11  0343   First MD Initiated Contact with Patient 10/27/11 0405      Chief Complaint  Patient presents with  . Chest Pain    (Consider location/radiation/quality/duration/timing/severity/associated sxs/prior treatment) HPI Comments: Woke from sleep this morning feeling short of breath, tight in chest.  Had CABG in 06/2011.  Patient is a 71 y.o. male presenting with chest pain. The history is provided by the patient.  Chest Pain The chest pain began 1 - 2 hours ago. Chest pain occurs constantly. The chest pain is unchanged. The pain is associated with breathing. The severity of the pain is moderate.     Past Medical History  Diagnosis Date  . CAD (coronary artery disease)   . Acute non-ST segment elevation myocardial infarction   . Left renal mass   . Diverticulitis   . COPD (chronic obstructive pulmonary disease)   . DM2 (diabetes mellitus, type 2)   . HTN (hypertension)     Past Surgical History  Procedure Date  . Coronary artery bypass grafting x4 06/28/11    Dr Tyrone Sage  . Hernia repair surgery x4.     Family History  Problem Relation Age of Onset  . Heart disease Father   . Alzheimer's disease Mother   . Alcohol abuse Brother     History  Substance Use Topics  . Smoking status: Former Smoker    Types: Cigarettes    Quit date: 06/19/2011  . Smokeless tobacco: Never Used  . Alcohol Use: No      Review of Systems  Cardiovascular: Positive for chest pain.  All other systems reviewed and are negative.    Allergies  Review of patient's allergies indicates no known allergies.  Home Medications   Current Outpatient Rx  Name Route Sig Dispense Refill  . ASPIRIN 325 MG PO TBEC Oral Take 325 mg by mouth daily.      . FENOFIBRATE 145 MG PO TABS Oral Take 145 mg by mouth daily.      Marland Kitchen GEMFIBROZIL 600 MG PO TABS Oral Take 600 mg by mouth 2 (two) times daily before a meal.      . LISINOPRIL 2.5 MG  PO TABS Oral Take 2.5 mg by mouth daily.      Marland Kitchen METFORMIN HCL ER (OSM) 500 MG PO TB24 Oral Take 500 mg by mouth 2 (two) times daily with a meal.      . METOPROLOL TARTRATE 50 MG PO TABS Oral Take 25 mg by mouth 2 (two) times daily.     Marland Kitchen ONE-DAILY MULTI VITAMINS PO TABS Oral Take 1 tablet by mouth daily.      . OMEGA-3-ACID ETHYL ESTERS 1 G PO CAPS Oral Take 2 g by mouth 2 (two) times daily.      Marland Kitchen SIMVASTATIN 40 MG PO TABS Oral Take 40 mg by mouth at bedtime.      . TRAMADOL HCL 50 MG PO TABS Oral Take 1 tablet (50 mg total) by mouth every 6 (six) hours as needed for pain. Maximum dose= 8 tablets per day 40 tablet 0    BP 142/106  Pulse 154  Temp 97.5 F (36.4 C)  Resp 21  SpO2 96%  Physical Exam  Nursing note and vitals reviewed. Constitutional: He is oriented to person, place, and time. He appears well-developed and well-nourished. No distress.  HENT:  Head: Normocephalic and atraumatic.  Neck: Normal range of motion.  Cardiovascular:  Tachycardic  Pulmonary/Chest: Effort normal and breath sounds normal. No respiratory distress.  Abdominal: Soft. Bowel sounds are normal. He exhibits no distension. There is no tenderness.  Musculoskeletal: Normal range of motion. He exhibits no edema.  Neurological: He is alert and oriented to person, place, and time.  Skin: Skin is warm and dry. He is not diaphoretic.    ED Course  Procedures (including critical care time)   Labs Reviewed  CBC  DIFFERENTIAL  COMPREHENSIVE METABOLIC PANEL  CARDIAC PANEL(CRET KIN+CKTOT+MB+TROPI)  APTT  PROTIME-INR   No results found.   No diagnosis found.   Date: 10/27/2011  Rate: 155  Rhythm: atrial flutter  QRS Axis: normal  Intervals: normal  ST/T Wave abnormalities: nonspecific T wave changes  Conduction Disutrbances:none  Narrative Interpretation:   Old EKG Reviewed: changes noted     MDM  Arrived in what appears to be AFL at rate 155.  Given cardizem IV and rate slowed.  EKG  repeated, confirming AFL.  Started on drip.  I spoke with Dr. Jacinto Halim who agrees to admit the patient.        Geoffery Lyons, MD 10/27/11 [phone]

## 2011-10-27 NOTE — Progress Notes (Signed)
Could not see what pts rhythm was clearly on tele;EKG was obtained, pt looks to be back in flutter; also pts HR jumps to the 120as when pt gets up and moves around, resting is in the 80s; MD paged and made aware, new orders received

## 2011-10-27 NOTE — ED Notes (Signed)
Pt had CABG in October.  Started to have CP tonight nonradiating.  Reports that he was nauseated, SOB and diaphoretic.

## 2011-10-28 ENCOUNTER — Other Ambulatory Visit: Payer: Self-pay

## 2011-10-28 ENCOUNTER — Encounter (HOSPITAL_COMMUNITY): Payer: Self-pay | Admitting: Dietician

## 2011-10-28 LAB — GLUCOSE, CAPILLARY
Glucose-Capillary: 105 mg/dL — ABNORMAL HIGH (ref 70–99)
Glucose-Capillary: 104 mg/dL — ABNORMAL HIGH (ref 70–99)
Glucose-Capillary: 140 mg/dL — ABNORMAL HIGH (ref 70–99)

## 2011-10-28 MED ORDER — DIGOXIN 250 MCG PO TABS
0.2500 mg | ORAL_TABLET | Freq: Every day | ORAL | Status: DC
  Administered 2011-10-28 – 2011-10-29 (×2): 0.25 mg via ORAL
  Filled 2011-10-28 (×3): qty 1

## 2011-10-28 NOTE — Progress Notes (Signed)
UR Completed. Simmons, Lethia Donlon F [phone]  

## 2011-10-28 NOTE — Progress Notes (Signed)
Pt evaluated for long term disease management services with [facility] program, as a benefit of [employer]. RN case manager will do a post d/c transition of care call and monthly home visit assessments for HTN, DM, COPD, CAD, Atrial Flutter and other needs. Brooke Bonito C. Roena Malady, RN, MS, CCM Inpatient Hospital Liaison [facility] [phone]

## 2011-10-28 NOTE — Progress Notes (Signed)
Subjective:  Feeling much better. Dyspnea improved. No chest pain or palpitations.  Objective:  Vital Signs in the last 24 hours: Temp:  [97.3 F (36.3 C)-98.1 F (36.7 C)] 97.3 F (36.3 C) (02/14 1937) Pulse Rate:  [80-112] 87  (02/14 1937) Resp:  [18-20] 20  (02/14 1937) BP: (105-132)/(62-70) 126/63 mmHg (02/14 1937) SpO2:  [90 %-94 %] 94 % (02/14 1937)  Intake/Output from previous day: 02/13 0701 - 02/14 0700 In: 375 [P.O.:360; I.V.:15] Out: 3775 [Urine:3775]  Physical Exam:   General appearance: alert, cooperative, appears stated age and no distress Eyes: conjunctivae/corneas clear. PERRL, EOM's intact. Fundi benign. Neck: no adenopathy, no carotid bruit, no JVD, supple, symmetrical, trachea midline and thyroid not enlarged, symmetric, no tenderness/mass/nodules Neck: JVP - normal, carotids 2+= without bruits Resp: clear to auscultation bilaterally Chest wall: no tenderness Cardio: regular rate and rhythm, S1, S2 normal, no murmur, click, rub or gallop and normal apical impulse GI: soft, non-tender; bowel sounds normal; no masses,  no organomegaly Extremities: extremities normal, atraumatic, no cyanosis or edema    Lab Results:  [facility] 10/27/11 0420  WBC 8.0  HGB 13.7  PLT 165    Basename 10/27/11 0420  NA 137  K 3.8  CL 103  CO2 23  GLUCOSE 122*  BUN 17  CREATININE 1.14    Basename 10/27/11 0421  TROPONINI <0.30   Hepatic Function Panel  Basename 10/27/11 0420  PROT 7.3  ALBUMIN 3.7  AST 16  ALT 12  ALKPHOS 36*  BILITOT 0.3  BILIDIR --  IBILI --   No results found for this basename: CHOL in the last 72 hours No results found for this basename: PROTIME in the last 72 hours  Imaging: Imaging results have been reviewed  Cardiac Studies:  Assessment/Plan:  1. A. Flutter with 3:1 conduction rate controlled. Tele: A. Flutter 2. Acute on chronic diastolic heart failure on negative fluid balance. 3. CAD  Rec: I have continued IV diuresis  for today and if stable will discharge in am. Will consider cardioversion in 3-4 weeks if he remains in A. Flutter. Met his daughter and explained regarding smoking, salt and fluid restriction.   Pamella Pert, M.D. 10/28/2011, 10:41 PM

## 2011-10-29 ENCOUNTER — Encounter (HOSPITAL_COMMUNITY): Payer: Medicare Other

## 2011-10-29 ENCOUNTER — Other Ambulatory Visit: Payer: Self-pay

## 2011-10-29 DIAGNOSIS — I5033 Acute on chronic diastolic (congestive) heart failure: Secondary | ICD-10-CM | POA: Diagnosis present

## 2011-10-29 LAB — BASIC METABOLIC PANEL WITH GFR
BUN: 19 mg/dL (ref 6–23)
CO2: 26 meq/L (ref 19–32)
Calcium: 9.9 mg/dL (ref 8.4–10.5)
Chloride: 98 meq/L (ref 96–112)
Creatinine, Ser: 1.07 mg/dL (ref 0.50–1.35)
GFR calc Af Amer: 78 mL/min — ABNORMAL LOW
GFR calc non Af Amer: 67 mL/min — ABNORMAL LOW
Glucose, Bld: 114 mg/dL — ABNORMAL HIGH (ref 70–99)
Potassium: 3.2 meq/L — ABNORMAL LOW (ref 3.5–5.1)
Sodium: 138 meq/L (ref 135–145)

## 2011-10-29 LAB — HEMOGLOBIN A1C
Hgb A1c MFr Bld: 5.8 % — ABNORMAL HIGH
Mean Plasma Glucose: 120 mg/dL — ABNORMAL HIGH

## 2011-10-29 MED ORDER — DILTIAZEM HCL ER 120 MG PO CP24: 120.0000 mg | ORAL_CAPSULE | Freq: Two times a day (BID) | ORAL | Status: DC

## 2011-10-29 MED ORDER — ALPRAZOLAM 0.5 MG PO TABS: 0.5000 mg | ORAL_TABLET | Freq: Three times a day (TID) | ORAL | Status: AC | PRN

## 2011-10-29 MED ORDER — TRIAMTERENE-HCTZ 37.5-25 MG PO TABS: 1.0000 | ORAL_TABLET | ORAL | Status: DC

## 2011-10-29 MED ORDER — DIGOXIN 250 MCG PO TABS: 0.2500 mg | ORAL_TABLET | Freq: Every day | ORAL | Status: DC

## 2011-10-29 MED ORDER — ASPIRIN 81 MG PO TBEC: 81.0000 mg | DELAYED_RELEASE_TABLET | Freq: Every day | ORAL | Status: DC

## 2011-10-29 MED ORDER — DABIGATRAN ETEXILATE MESYLATE 150 MG PO CAPS: 150.0000 mg | ORAL_CAPSULE | Freq: Two times a day (BID) | ORAL | Status: DC

## 2011-10-29 NOTE — Discharge Instructions (Signed)
Avoid salt and restrict fluid intake to 1.6-1.8 Liters per day.

## 2011-10-29 NOTE — Discharge Summary (Signed)
Physician Discharge Summary  Patient ID: [name] MRN: [id number] DOB/AGE: 14-Aug-1941 71 y.o.  Admit date: 10/27/2011 Discharge date: 10/29/2011  Primary Discharge Diagnosis A. Flutter with RVR Secondary Discharge DiagnosisAcute on chronic diastolic heart failure  Significant Diagnostic Studies:  Consults:   Hospital Course: Patient was admitted with a shortness of breath and generalized weakness. Admission to the hospital he was found to be in new onset of atrial flutter. He was started on calcium channel blocker along with beta blocker therapy and digoxin was added for rate control. She was also started on Pradaxa for anticoagulation. He diuresed close to 5.5 L in 3 days end was laying down flat in bed without any distress and felt well and needed to go home. Felt stable for discharge but with close outpatient followup. Patient will need cardioversion in 4 weeks after being anticoagulated.  Discharge Exam: Blood pressure 111/67, pulse 73, temperature 97.6 F (36.4 C), temperature source Oral, resp. rate 20, height 6' (1.829 m), weight 89 kg (196 lb 3.4 oz), SpO2 99.00%  General appearance: alert, cooperative and appears stated age Eyes: conjunctivae/corneas clear. PERRL, EOM's intact. Fundi benign. Throat: lips, mucosa, and tongue normal; teeth and gums normal Neck: no adenopathy, no carotid bruit, no JVD, supple, symmetrical, trachea midline and thyroid not enlarged, symmetric, no tenderness/mass/nodules Resp: wheezes bilaterally and scattered Cardio: irregularly irregular rhythm and No murmur GI: soft, non-tender; bowel sounds normal; no masses,  no organomegaly Extremities: extremities normal, atraumatic, no cyanosis or edema Labs:   Lab Results  Component Value Date   WBC 8.0 10/27/2011   HGB 13.7 10/27/2011   HCT 41.3 10/27/2011   MCV 88.4 10/27/2011   PLT 165 10/27/2011    Lab 10/29/11 0600 10/27/11 0420  NA 138 --  K 3.2* --  CL 98 --  CO2 26 --  BUN 19 --    CREATININE 1.07 --  CALCIUM 9.9 --  PROT -- 7.3  BILITOT -- 0.3  ALKPHOS -- 36*  ALT -- 12  AST -- 16  GLUCOSE 114* --   Lab Results  Component Value Date   CKTOTAL 43 10/27/2011   CKMB 3.7 10/27/2011   TROPONINI <0.30 10/27/2011    Lab Results  Component Value Date   CHOL 132 06/20/2011   Lab Results  Component Value Date   HDL 27* 06/20/2011   Lab Results  Component Value Date   LDLCALC 76 06/20/2011   Lab Results  Component Value Date   TRIG 147 06/20/2011   Lab Results  Component Value Date   CHOLHDL 4.9 06/20/2011   No results found for this basename: LDLDIRECT      Radiology: Dg Chest Port 1 View  10/27/2011  *RADIOLOGY REPORT*  Clinical Data: Shortness of breath.  History of hypertension.  PORTABLE CHEST - 1 VIEW  Comparison: 07/26/2011  Findings: Stable appearance of postoperative changes in the mediastinum.  Shallow inspiration.  Increasing heart size and pulmonary vascularity since previous study suggesting pulmonary vascular congestion.  Interstitial edema.  Superimposed opacity in the right upper lung could represent superimposed pneumonia or progressive edema.  No blunting of costophrenic angles.  No pneumothorax.  IMPRESSION: Increasing heart size and pulmonary vascular congestion since previous study with evidence of perihilar edema.  Focal edema or consolidation in the right upper lungs.  Original Report Authenticated By: Marlon Pel, M.D.    EKG:A. Flutter with variable ventricular conduction.   FOLLOW UP PLANS AND APPOINTMENTS  Medication List  As of 10/29/2011  8:46  AM   STOP taking these medications         multivitamin tablet         TAKE these medications         ALPRAZolam 0.5 MG tablet   Commonly known as: XANAX   Take 1 tablet (0.5 mg total) by mouth 3 (three) times daily as needed for anxiety.      aspirin 81 MG EC tablet   Take 1 tablet (81 mg total) by mouth daily.      dabigatran 150 MG Caps   Commonly known as: PRADAXA    Take 1 capsule (150 mg total) by mouth every 12 (twelve) hours.      digoxin 0.25 MG tablet   Commonly known as: LANOXIN   Take 1 tablet (0.25 mg total) by mouth daily.      diltiazem 120 MG 24 hr capsule   Commonly known as: DILACOR XR   Take 1 capsule (120 mg total) by mouth every 12 (twelve) hours.      fenofibrate 145 MG tablet   Commonly known as: TRICOR   Take 145 mg by mouth daily.      lisinopril 2.5 MG tablet   Commonly known as: PRINIVIL,ZESTRIL   Take 2.5 mg by mouth daily.      metformin 500 MG (OSM) 24 hr tablet   Commonly known as: FORTAMET   Take 500 mg by mouth 2 (two) times daily with a meal.      metoprolol 50 MG tablet   Commonly known as: LOPRESSOR   Take 25 mg by mouth 2 (two) times daily.      multivitamins ther. w/minerals Tabs   Take 1 tablet by mouth daily.      omega-3 acid ethyl esters 1 G capsule   Commonly known as: LOVAZA   Take 2 g by mouth 2 (two) times daily.      polyvinyl alcohol 1.4 % ophthalmic solution   Commonly known as: LIQUIFILM TEARS   Place 1 drop into both eyes as needed. Dry eyes      triamterene-hydrochlorothiazide 37.5-25 MG per tablet   Commonly known as: MAXZIDE-25   Take 1 each (1 tablet total) by mouth every morning.           Follow-up Information    Follow up with Benita Stabile, MD in 2 weeks.      Follow up with Pamella Pert, MD in 5 days.   Contact information:   [address]  [place] [address] [phone]           Pamella Pert, MD 10/29/2011, 8:46 AM

## 2011-11-01 ENCOUNTER — Encounter (HOSPITAL_COMMUNITY): Payer: Medicare Other

## 2011-11-02 ENCOUNTER — Other Ambulatory Visit (HOSPITAL_COMMUNITY): Payer: Self-pay | Admitting: Urology

## 2011-11-02 ENCOUNTER — Ambulatory Visit (HOSPITAL_COMMUNITY)
Admission: RE | Admit: 2011-11-02 | Discharge: 2011-11-02 | Disposition: A | Discharge status: Home / Self Care | Payer: Medicare Other | Source: Ambulatory Visit | Attending: Urology | Admitting: Urology

## 2011-11-02 DIAGNOSIS — D4959 Neoplasm of unspecified behavior of other genitourinary organ: Secondary | ICD-10-CM

## 2011-11-02 DIAGNOSIS — Z951 Presence of aortocoronary bypass graft: Secondary | ICD-10-CM | POA: Insufficient documentation

## 2011-11-02 DIAGNOSIS — M47814 Spondylosis without myelopathy or radiculopathy, thoracic region: Secondary | ICD-10-CM | POA: Insufficient documentation

## 2011-11-03 ENCOUNTER — Encounter (HOSPITAL_COMMUNITY): Payer: Medicare Other

## 2011-11-05 ENCOUNTER — Encounter (HOSPITAL_COMMUNITY): Payer: Medicare Other

## 2011-11-08 ENCOUNTER — Encounter (HOSPITAL_COMMUNITY): Payer: Medicare Other

## 2011-11-10 ENCOUNTER — Encounter (HOSPITAL_COMMUNITY): Payer: Medicare Other

## 2011-11-12 ENCOUNTER — Encounter (HOSPITAL_COMMUNITY): Payer: Medicare Other

## 2011-11-16 ENCOUNTER — Other Ambulatory Visit: Payer: Self-pay | Admitting: Urology

## 2011-11-19 ENCOUNTER — Encounter (HOSPITAL_COMMUNITY): Payer: Self-pay | Admitting: Pharmacy Technician

## 2011-11-23 ENCOUNTER — Encounter (HOSPITAL_COMMUNITY)
Admission: RE | Admit: 2011-11-23 | Discharge: 2011-11-23 | Disposition: A | Discharge status: Home / Self Care | Payer: Medicare Other | Source: Ambulatory Visit | Attending: Urology | Admitting: Urology

## 2011-11-23 ENCOUNTER — Encounter (HOSPITAL_COMMUNITY): Payer: Self-pay

## 2011-11-23 HISTORY — DX: Reserved for inherently not codable concepts without codable children: IMO0001

## 2011-11-23 HISTORY — DX: Reserved for concepts with insufficient information to code with codable children: IMO0002

## 2011-11-23 HISTORY — DX: Acute myocardial infarction, unspecified: I21.9

## 2011-11-23 HISTORY — DX: Neoplasm of unspecified behavior of unspecified kidney: D49.519

## 2011-11-23 HISTORY — DX: Encounter for other specified aftercare: Z51.89

## 2011-11-23 LAB — BASIC METABOLIC PANEL
Calcium: 9.8 mg/dL (ref 8.4–10.5)
Creatinine, Ser: 0.91 mg/dL (ref 0.50–1.35)
GFR calc Af Amer: >90 mL/min (ref >90)
Sodium: 138 mEq/L (ref 135–145)

## 2011-11-23 LAB — SURGICAL PCR SCREEN: MRSA, PCR: NEGATIVE

## 2011-11-23 LAB — CBC
MCH: 29.5 pg (ref 26.0–34.0)
MCV: 84.9 fL (ref 78.0–100.0)
Platelets: 154 10*3/uL (ref 150–400)
RDW: 15.6 % — ABNORMAL HIGH (ref 11.5–15.5)
WBC: 9.2 10*3/uL (ref 4.0–10.5)

## 2011-11-23 NOTE — Patient Instructions (Signed)
[address] [name]  11/23/2011   Your procedure is scheduled on:  3/ 18/13  Report to SHORT STAY DEPT  at 8:30 AM.  Call this number if you have problems the morning of surgery: [phone]   Remember:   Do not eat food or drink liquids AFTER MIDNIGHT    Take these medicines the morning of surgery with A SIP OF WATER: METOPROLOL / FENOFIBRATE / DIGOXIN    Do not wear jewelry, make-up or nail polish.  Do not wear lotions, powders, or perfumes.   Do not shave legs or underarms 48 hrs. before surgery (men may shave face)  Do not bring valuables to the hospital.  Contacts, dentures or bridgework may not be worn into surgery.  Leave suitcase in the car. After surgery it may be brought to your room.  For patients admitted to the hospital, checkout time is 11:00 AM the day of discharge.   Patients discharged the day of surgery will not be allowed to drive home.  Name and phone number of your driver:   Special Instructions:   Please read over the following fact sheets that you were given: MRSA  Information / Incentive Spirometer instructions                SHOWER WITH BETASEPT THE NIGHT BEFORE SURGERY AND THE MORNING OF SURGERY                  Follow Bowel Prep  From office               MORNING OF SURGERY

## 2011-11-26 NOTE — H&P (Signed)
History of Present Illness  [name] is a 71 year old gentleman who was admitted to the hospital in October.  He was admitted with chest pain and found to have coronary artery disease requiring coronary artery bypass grafting x 4. Prior to his CABG, he underwent a CT scan due to abdominal pain and was incidentally found to have a 3.5 cm hyperdense lesion of the left kidney suspicious for renal cell carcinoma.  He proceeded with his CABG on 06/28/11 and recovered uneventfully. He was sent for consultation by Dr. Nydia Bouton after his CABG. He has had no hematuria or further abdominal pain symptoms.  He has no family history of kidney cancer.  I reviewed his CT scan which demonstrates a 3.5 cm mostly exophytic lesion off the lateral aspect of the lower pole of the left kidney.  He has a branching left renal artery which branches around the renal vein. He has no regional lymphadenopathy or other evidence of metastatic disease to the abdomen. His contralateral kidney and adrenal glands appear normal. A CT scan of the chest from 06/29/11 did not demonstrate evidence for metastatic disease.  Baseline renal function: Cr 0.74, GFR > 60 ml/min  His cardiologist is Dr. Jacinto Halim.  Interval history:  He follows up today for further evaluation of his incidentally detected left renal neoplasm which is concerning for renal cell carcinoma. At his last visit, he had just recovered from his coronary artery bypass graft and our plan was to reimage him a few months later after he had had further time to recover. He was seen by Dr. Jacinto Halim in December it was felt he likely could proceed with his renal surgery from a cardiac standpoint after that evaluation. However, he was recently admitted to the hospital when he became acutely short of breath. He was found to have atrial fibrillation/atrial flutter requiring further medical management. He is scheduled to have an echocardiogram this week and is scheduled to see Dr. Jacinto Halim next  Monday. He currently is asymptomatic and specifically denies any chest pain, shortness of breath, palpitations, or fluid retention.  He has denied any hematuria or flank pain. He follows up today with repeat chest and abdominal imaging.   Past Medical History Problems  1. History of  Acute Myocardial Infarction V12.59 2. History of  Arthritis V13.4 3. History of  Atrial Fibrillation 427.31 4. History of  Atrial Flutter 427.32 5. History of  Gastric Ulcer 531.90 6. History of  Hypercholesterolemia 272.0 7. History of  Hypertension 401.9  Surgical History Problems  1. History of  CABG (CABG) V45.81 2. History of  Colon Surgery 3. History of  Exploratory Laparotomy 4. History of  Hernia Repair Bilateral   For diverticulitis.   Current Meds 1. Aspirin 325 MG Oral Tablet; Therapy: (Recorded:05Dec2012) to 2. Centrum Silver TABS; Therapy: (Recorded:05Dec2012) to 3. Lisinopril 2.5 MG Oral Tablet; Therapy: (Recorded:05Dec2012) to 4. Lovaza 1 GM Oral Capsule; Therapy: (Recorded:05Dec2012) to 5. MetFORMIN HCl 500 MG Oral Tablet; Therapy: (Recorded:05Dec2012) to 6. Metoprolol Succinate ER 25 MG Oral Tablet Extended Release 24 Hour; Therapy:  (Recorded:05Dec2012) to 7. Tricor 145 MG Oral Tablet; Therapy: (Recorded:05Dec2012) to  Allergies Medication  1. No Known Drug Allergies  Family History Denied  1. Family history of  Kidney Cancer  Social History Problems  1. Former Smoker V15.82 1 ppdx 5o years quit this year 2012 2. Marital History - Currently Married 3. Retired From Work  Review of Systems Constitutional, skin, eye, otolaryngeal, hematologic/lymphatic, cardiovascular, pulmonary, endocrine, musculoskeletal, gastrointestinal, neurological  and psychiatric system(s) were reviewed and pertinent findings if present are noted.  Cardiovascular: no chest pain, no palpitations and no leg swelling.  Respiratory: no shortness of breath.    Vitals Vital Signs [Data Includes: Last  1 Day]  26Feb2013 09:19AM  BMI Calculated: 28.17 BSA Calculated: 2.17 Weight: 208 lb  Blood Pressure: 154 / 83 Heart Rate: 103  Physical Exam Constitutional: Well nourished and well developed . No acute distress.  ENT:. The ears and nose are normal in appearance.  Neck: no neck mass is present.  Pulmonary: No respiratory distress, normal respiratory rhythm and effort and clear bilateral breath sounds.  Cardiovascular:. No peripheral edema. Medial sternotomy scar(s). His heart rhythm is regular. His pulse is approximately 90.  Abdomen: midline incision site(s) well healed. The abdomen is soft and nontender. No masses are palpated. No CVA tenderness. No hernias are palpable. No hepatosplenomegaly noted.  Lymphatics: The supraclavicular, femoral and inguinal nodes are not enlarged or tender.  Skin: Normal skin turgor, no visible rash and no visible skin lesions.  Neuro/Psych:. Mood and affect are appropriate.    Results/Data Selected Results  CT-ABD W/W/O CONTRAST 20Feb2013 12:00AM [name]   Test Name Result Flag Reference  ** RADIOLOGY REPORT BY [facility] ** ORIGINAL APPROVED BY: Gerrianne Scale, M.D. ON: 11/03/2011 10:38:48   *RADIOLOGY REPORT*  Clinical Data: Follow-up left renal mass.  CT ABDOMEN WITHOUT AND WITH CONTRAST  Technique: Multidetector CT imaging of the abdomen was performed following the standard protocol before and during bolus administration of intravenous contrast.  Contrast: 125 ml Isovue 300 intravenously.  Comparison: Abdominal CT 06/25/2011.  Findings: The heterogeneous enhancing mass involving the lower pole of the left kidney has slightly enlarged. This measures 3.8 x 3.6 cm transverse compared with 3.6 x 3.5 cm previously. The left renal vein remains patent. There is no retroperitoneal lymphadenopathy. A 6 mm low density lesion posteriorly in the mid left kidney on image 44 appears slightly larger but is too small  to characterize. The right kidney appears unremarkable.  There is stable mild scarring or atelectasis in both lung bases status post CABG. No significant pleural effusion is present. The liver, spleen, pancreas and adrenal glands appear normal. Small calcified gallstones are again noted. There is no gallbladder wall thickening or biliary dilatation.  Diffuse vascular calcifications are again noted. There are scattered colonic diverticular changes. No ascites or peritoneal nodularity is seen. Mild lumbar spondylosis appears unchanged.  The pelvis was not imaged.  IMPRESSION:  1. Minimal interval enlargement of enhancing mass involving the lower pole of the left kidney consistent with renal cell carcinoma. 2. No evidence of metastatic disease. 3. Small indeterminate lesion involving the mid-left kidney posteriorly appears slightly larger. 4. Stable cholelithiasis and atherosclerosis.   BUN & CREATININE 19Feb2013 08:23AM [name]  SPECIMEN TYPE: BLOOD   Test Name Result Flag Reference  CREATININE 1.37 mg/dL  1.61-0.96  BUN 25 mg/dL H 0-45  Est GFR, African American 59 mL/min L   Est GFR, NonAfrican American 51 mL/min L   The estimated GFR is a calculation valid for adults (61 to 73 years old) that uses the CKD-EPI algorithm to adjust for age and sex. It is not to be used for children, pregnant women, hospitalized patients, patients on dialysis, or with rapidly changing kidney function. According to the NKDEP, eGFR >89 is normal, 60-89 shows mild impairment, 30-59 shows moderate impairment, 15-29 shows severe impairment and <15 is ESRD.   COMPREHENSIVE METABOLIC PANEL 05Dec2012 11:15AM  [name]  SPECIMEN TYPE: BLOOD   Test Name Result Flag Reference  GLUCOSE 96 mg/dL  24-40  BUN 13 mg/dL  1-02  CREATININE 7.25 mg/dL  3.66-4.40  SODIUM 347 mEq/L  135-145  POTASSIUM 4.4 mEq/L  3.5-5.3  CHLORIDE 105 mEq/L  96-112  CO2 26 mEq/L  19-32  CALCIUM 9.3 mg/dL   4.2-59.5  TOTAL PROTEIN 6.8 g/dL  6.3-8.7  ALBUMIN 4.0 g/dL  5.6-4.3  AST/SGOT 19 U/L  0-37  ALT/SGPT 16 U/L  0-53  ALKALINE PHOSPHATASE 42 U/L  39-117  BILIRUBIN, TOTAL 0.3 mg/dL  3.2-9.5  Est GFR, African American 81 mL/min    Est GFR, NonAfrican American 70 mL/min    The estimated GFR is a calculation valid for adults (80 to 73 years old) that uses the CKD-EPI algorithm to adjust for age and sex. It is not to be used for children, pregnant women, hospitalized patients, patients on dialysis, or with rapidly changing kidney function. According to the NKDEP, eGFR >89 is normal, 60-89 shows mild impairment, 30-59 shows moderate impairment, 15-29 shows severe impairment and <15 is ESRD.    I independently reviewed his CT scan. His left renal mass has increased slightly in size compared to his prior imaging. He has a small indeterminate 6 mm lesion in the posterior aspect of the kidney which is unable to characterize and  statistically would represent a cyst but could also be a very small solid tumor. There is no evidence of regional lymphadenopathy. He does have a branching renal artery to the left kidney. I also reviewed his chest x-ray which demonstrates no evidence for pulmonary metastases.  Assessment Assessed  1. Renal Neoplasm 239.5  Plan Health Maintenance (V70.0)  1. UA With REFLEX  Done: 26Feb2013  Discussion/Summary  1. Left renal neoplasm concerning for malignancy:   The patient was provided information regarding their renal mass including the relative risk of benign versus malignant pathology and the natural history of renal cell carcinoma and other possible malignancies of the kidney. The role of renal biopsy, laboratory testing, and imaging studies to further characterize renal masses and/or the presence of metastatic disease were explained. We discussed the role of active surveillance, surgical therapy with both radical nephrectomy and nephron-sparing surgery, and ablative  therapy in the treatment of renal masses. In addition, we discussed our goals of providing an accurate diagnosis and oncologic control while maintaining optimal renal function as appropriate based on the size, location, and complexity of their renal mass as well as their co-morbidities.    We have discussed the risks of treatment in detail including but not limited to bleeding, infection, heart attack, stroke, death, venothromoboembolism, cancer recurrence, injury/damage to surrounding organs and structures, urine leak, the possibility of open surgical conversion for patients undergoing minimally invasive surgery, the risk of developing chronic kidney disease and its associated implications, and the potential risk of end stage renal disease possibly necessitating dialysis.   55 minutes were spent with the patient providing consultation today.     We discussed optimal therapy which would be to proceed with a left partial nephrectomy which could be performed with robotic assistance and minimal invasive fashion. Unfortunately, he has had a change in his cardiac status and does require further evaluation by Dr. Jacinto Halim prior to proceeding with any surgical intervention. He apparently has been considered for possible cardioversion which might need to be performed prior to his renal surgery. If he ultimately does require anticoagulation for atrial fibrillation, we would have to discontinue  this around the time of his surgery. Finally, I will rely on Dr. Jacinto Halim to provide further risk stratification from a cardiac standpoint for him undergoing a major noncardiac surgery at this point. From a technical standpoint, he also understands the significant risk for open surgical conversion based on his prior abdominal surgery. He will also need to stop his aspirin prior to his surgery considering the significant risk for bleeding associated with a partial nephrectomy. In addition, he does have a very small lesion posteriorly  which theoretically could represent a small early renal cell carcinoma. However, this is much less likely and considering his age and comorbidities, I think this would be best followed with surveillance imaging in the future.   Cc: Dr. Yates Decamp Dr. Lupe Carney Dr. Lenard Lance     SignaturesElectronically signed by : [name], M.D.; Nov 09 2011 10:15AM

## 2011-11-29 ENCOUNTER — Ambulatory Visit (HOSPITAL_COMMUNITY): Payer: Medicare Other | Admitting: Certified Registered Nurse Anesthetist

## 2011-11-29 ENCOUNTER — Encounter (HOSPITAL_COMMUNITY)
Admission: RE | Disposition: A | Discharge status: Home / Self Care | Payer: Self-pay | Source: Ambulatory Visit | Attending: Urology

## 2011-11-29 ENCOUNTER — Encounter (HOSPITAL_COMMUNITY): Payer: Self-pay | Admitting: Certified Registered Nurse Anesthetist

## 2011-11-29 ENCOUNTER — Encounter (HOSPITAL_COMMUNITY): Payer: Self-pay | Admitting: *Deleted

## 2011-11-29 ENCOUNTER — Inpatient Hospital Stay (HOSPITAL_COMMUNITY)
Admission: RE | Admit: 2011-11-29 | Discharge: 2011-12-01 | DRG: 658 | Disposition: A | Discharge status: Home / Self Care | Payer: Medicare Other | Source: Ambulatory Visit | Attending: Urology | Admitting: Urology

## 2011-11-29 DIAGNOSIS — I252 Old myocardial infarction: Secondary | ICD-10-CM

## 2011-11-29 DIAGNOSIS — I4891 Unspecified atrial fibrillation: Secondary | ICD-10-CM | POA: Diagnosis present

## 2011-11-29 DIAGNOSIS — I10 Essential (primary) hypertension: Secondary | ICD-10-CM | POA: Diagnosis present

## 2011-11-29 DIAGNOSIS — E7800 Pure hypercholesterolemia, unspecified: Secondary | ICD-10-CM | POA: Diagnosis present

## 2011-11-29 DIAGNOSIS — Z01812 Encounter for preprocedural laboratory examination: Secondary | ICD-10-CM

## 2011-11-29 DIAGNOSIS — J4489 Other specified chronic obstructive pulmonary disease: Secondary | ICD-10-CM | POA: Diagnosis present

## 2011-11-29 DIAGNOSIS — C649 Malignant neoplasm of unspecified kidney, except renal pelvis: Principal | ICD-10-CM | POA: Diagnosis present

## 2011-11-29 DIAGNOSIS — F17200 Nicotine dependence, unspecified, uncomplicated: Secondary | ICD-10-CM | POA: Diagnosis present

## 2011-11-29 DIAGNOSIS — Z951 Presence of aortocoronary bypass graft: Secondary | ICD-10-CM

## 2011-11-29 DIAGNOSIS — E119 Type 2 diabetes mellitus without complications: Secondary | ICD-10-CM | POA: Diagnosis present

## 2011-11-29 DIAGNOSIS — J449 Chronic obstructive pulmonary disease, unspecified: Secondary | ICD-10-CM | POA: Diagnosis present

## 2011-11-29 DIAGNOSIS — Z01810 Encounter for preprocedural cardiovascular examination: Secondary | ICD-10-CM

## 2011-11-29 DIAGNOSIS — I2510 Atherosclerotic heart disease of native coronary artery without angina pectoris: Secondary | ICD-10-CM | POA: Diagnosis present

## 2011-11-29 DIAGNOSIS — M129 Arthropathy, unspecified: Secondary | ICD-10-CM | POA: Diagnosis present

## 2011-11-29 HISTORY — PX: ROBOT ASSISTED LAPAROSCOPIC NEPHRECTOMY: SHX5140

## 2011-11-29 LAB — PROTIME-INR
INR: 1 (ref 0.00–1.49)
Prothrombin Time: 13.4 seconds (ref 11.6–15.2)

## 2011-11-29 LAB — BASIC METABOLIC PANEL
Calcium: 8.3 mg/dL — ABNORMAL LOW (ref 8.4–10.5)
GFR calc Af Amer: 85 mL/min — ABNORMAL LOW (ref >90)
GFR calc non Af Amer: 73 mL/min — ABNORMAL LOW (ref >90)
Potassium: 3.7 mEq/L (ref 3.5–5.1)
Sodium: 136 mEq/L (ref 135–145)

## 2011-11-29 LAB — HEMOGLOBIN AND HEMATOCRIT, BLOOD
HCT: 41.4 % (ref 39.0–52.0)
Hemoglobin: 14.2 g/dL (ref 13.0–17.0)

## 2011-11-29 SURGERY — ROBOTIC ASSISTED LAPAROSCOPIC NEPHRECTOMY
Anesthesia: General | Laterality: Left | Wound class: Clean Contaminated

## 2011-11-29 MED ORDER — PROMETHAZINE HCL 25 MG/ML IJ SOLN: 6.2500 mg | INTRAMUSCULAR | Status: DC | PRN

## 2011-11-29 MED ORDER — LIDOCAINE HCL (CARDIAC) 20 MG/ML IV SOLN
INTRAVENOUS | Status: DC | PRN
  Administered 2011-11-29: 50 mg via INTRAVENOUS

## 2011-11-29 MED ORDER — EPHEDRINE SULFATE 50 MG/ML IJ SOLN
INTRAMUSCULAR | Status: DC | PRN
  Administered 2011-11-29: 10 mg via INTRAVENOUS

## 2011-11-29 MED ORDER — NITROGLYCERIN 0.4 MG SL SUBL: 0.4000 mg | SUBLINGUAL_TABLET | SUBLINGUAL | Status: DC | PRN

## 2011-11-29 MED ORDER — ALPRAZOLAM 0.5 MG PO TABS: 0.5000 mg | ORAL_TABLET | Freq: Three times a day (TID) | ORAL | Status: DC | PRN

## 2011-11-29 MED ORDER — PROPOFOL 10 MG/ML IV EMUL
INTRAVENOUS | Status: DC | PRN
  Administered 2011-11-29: 170 mg via INTRAVENOUS

## 2011-11-29 MED ORDER — ACETAMINOPHEN 10 MG/ML IV SOLN
1000.0000 mg | Freq: Four times a day (QID) | INTRAVENOUS | Status: AC
  Administered 2011-11-29 – 2011-11-30 (×4): 1000 mg via INTRAVENOUS
  Filled 2011-11-29 (×5): qty 100

## 2011-11-29 MED ORDER — NEOSTIGMINE METHYLSULFATE 1 MG/ML IJ SOLN
INTRAMUSCULAR | Status: DC | PRN
  Administered 2011-11-29: 5 mg via INTRAVENOUS

## 2011-11-29 MED ORDER — CEFAZOLIN SODIUM 1-5 GM-% IV SOLN
INTRAVENOUS | Status: AC
  Filled 2011-11-29: qty 100

## 2011-11-29 MED ORDER — HYDROMORPHONE HCL PF 1 MG/ML IJ SOLN
0.2500 mg | INTRAMUSCULAR | Status: DC | PRN
  Administered 2011-11-29 (×2): 0.5 mg via INTRAVENOUS

## 2011-11-29 MED ORDER — POLYVINYL ALCOHOL 1.4 % OP SOLN
1.0000 [drp] | OPHTHALMIC | Status: DC | PRN
  Filled 2011-11-29: qty 15

## 2011-11-29 MED ORDER — FUROSEMIDE 20 MG PO TABS
20.0000 mg | ORAL_TABLET | Freq: Every day | ORAL | Status: DC
  Administered 2011-11-29 – 2011-12-01 (×3): 20 mg via ORAL
  Filled 2011-11-29 (×4): qty 1

## 2011-11-29 MED ORDER — CEFAZOLIN SODIUM 1-5 GM-% IV SOLN
1.0000 g | Freq: Three times a day (TID) | INTRAVENOUS | Status: AC
  Administered 2011-11-29 – 2011-11-30 (×2): 1 g via INTRAVENOUS
  Filled 2011-11-29 (×2): qty 50

## 2011-11-29 MED ORDER — DOCUSATE SODIUM 100 MG PO CAPS
100.0000 mg | ORAL_CAPSULE | Freq: Two times a day (BID) | ORAL | Status: DC
  Administered 2011-11-29 – 2011-12-01 (×4): 100 mg via ORAL
  Filled 2011-11-29 (×7): qty 1

## 2011-11-29 MED ORDER — METOPROLOL TARTRATE 25 MG PO TABS
25.0000 mg | ORAL_TABLET | Freq: Two times a day (BID) | ORAL | Status: DC
  Administered 2011-11-29 – 2011-12-01 (×4): 25 mg via ORAL
  Filled 2011-11-29 (×7): qty 1

## 2011-11-29 MED ORDER — FENTANYL CITRATE 0.05 MG/ML IJ SOLN
INTRAMUSCULAR | Status: DC | PRN
  Administered 2011-11-29: 100 ug via INTRAVENOUS
  Administered 2011-11-29: 50 ug via INTRAVENOUS

## 2011-11-29 MED ORDER — DILTIAZEM HCL ER 120 MG PO CP24
120.0000 mg | ORAL_CAPSULE | Freq: Every day | ORAL | Status: DC
  Administered 2011-11-29 – 2011-11-30 (×2): 120 mg via ORAL
  Filled 2011-11-29 (×4): qty 1

## 2011-11-29 MED ORDER — LACTATED RINGERS IR SOLN
Status: DC | PRN
  Administered 2011-11-29: 1000 mL

## 2011-11-29 MED ORDER — ACETAMINOPHEN 10 MG/ML IV SOLN
INTRAVENOUS | Status: DC | PRN
  Administered 2011-11-29: 1000 mg via INTRAVENOUS

## 2011-11-29 MED ORDER — ESCITALOPRAM OXALATE 10 MG PO TABS
10.0000 mg | ORAL_TABLET | Freq: Every evening | ORAL | Status: DC
  Administered 2011-11-29 – 2011-11-30 (×2): 10 mg via ORAL
  Filled 2011-11-29 (×4): qty 1

## 2011-11-29 MED ORDER — GLYCOPYRROLATE 0.2 MG/ML IJ SOLN
INTRAMUSCULAR | Status: DC | PRN
  Administered 2011-11-29: 0.2 mg via INTRAVENOUS
  Administered 2011-11-29: .6 mg via INTRAVENOUS

## 2011-11-29 MED ORDER — LACTATED RINGERS IV SOLN
INTRAVENOUS | Status: DC | PRN
  Administered 2011-11-29 (×3): via INTRAVENOUS

## 2011-11-29 MED ORDER — ONDANSETRON HCL 4 MG/2ML IJ SOLN: 4.0000 mg | INTRAMUSCULAR | Status: DC | PRN

## 2011-11-29 MED ORDER — ACETAMINOPHEN 10 MG/ML IV SOLN
INTRAVENOUS | Status: AC
  Filled 2011-11-29: qty 100

## 2011-11-29 MED ORDER — HYDROMORPHONE HCL PF 1 MG/ML IJ SOLN
INTRAMUSCULAR | Status: AC
  Filled 2011-11-29: qty 1

## 2011-11-29 MED ORDER — BUPIVACAINE LIPOSOME 1.3 % IJ SUSP
20.0000 mL | Freq: Once | INTRAMUSCULAR | Status: AC
  Administered 2011-11-29: 35 mL
  Filled 2011-11-29: qty 20

## 2011-11-29 MED ORDER — ONDANSETRON HCL 4 MG/2ML IJ SOLN
INTRAMUSCULAR | Status: DC | PRN
  Administered 2011-11-29: 4 mg via INTRAVENOUS

## 2011-11-29 MED ORDER — MANNITOL 25 % IV SOLN
INTRAVENOUS | Status: AC
  Filled 2011-11-29: qty 100

## 2011-11-29 MED ORDER — DIGOXIN 250 MCG PO TABS
250.0000 ug | ORAL_TABLET | Freq: Every day | ORAL | Status: DC
  Administered 2011-11-30 – 2011-12-01 (×2): 250 ug via ORAL
  Filled 2011-11-29 (×3): qty 1

## 2011-11-29 MED ORDER — HYDROCODONE-ACETAMINOPHEN 5-325 MG PO TABS
1.0000 | ORAL_TABLET | Freq: Four times a day (QID) | ORAL | Status: AC | PRN
Start: 2011-11-29 — End: 2011-12-09

## 2011-11-29 MED ORDER — MORPHINE SULFATE 2 MG/ML IJ SOLN
2.0000 mg | INTRAMUSCULAR | Status: DC | PRN
  Administered 2011-11-29: 2 mg via INTRAVENOUS
  Administered 2011-11-29: 4 mg via INTRAVENOUS
  Administered 2011-11-29: 2 mg via INTRAVENOUS
  Administered 2011-11-30: 4 mg via INTRAVENOUS
  Administered 2011-11-30: 2 mg via INTRAVENOUS
  Administered 2011-11-30 (×2): 4 mg via INTRAVENOUS
  Filled 2011-11-29 (×2): qty 1
  Filled 2011-11-29 (×4): qty 2
  Filled 2011-11-29: qty 1

## 2011-11-29 MED ORDER — METOPROLOL TARTRATE 25 MG PO TABS
25.0000 mg | ORAL_TABLET | Freq: Once | ORAL | Status: AC
  Administered 2011-11-29: 25 mg via ORAL
  Filled 2011-11-29: qty 1

## 2011-11-29 MED ORDER — DEXTROSE-NACL 5-0.45 % IV SOLN
INTRAVENOUS | Status: DC
  Administered 2011-11-29 – 2011-11-30 (×3): via INTRAVENOUS

## 2011-11-29 MED ORDER — LACTATED RINGERS IV SOLN
INTRAVENOUS | Status: DC
  Administered 2011-11-29: 1000 mL via INTRAVENOUS

## 2011-11-29 MED ORDER — MANNITOL 25 % IV SOLN
INTRAVENOUS | Status: DC | PRN
  Administered 2011-11-29 (×2): 50 mL via INTRAVENOUS

## 2011-11-29 MED ORDER — ZOLPIDEM TARTRATE 5 MG PO TABS
5.0000 mg | ORAL_TABLET | Freq: Every evening | ORAL | Status: DC | PRN
  Administered 2011-11-29 – 2011-11-30 (×2): 5 mg via ORAL
  Filled 2011-11-29 (×2): qty 1

## 2011-11-29 MED ORDER — ROCURONIUM BROMIDE 100 MG/10ML IV SOLN
INTRAVENOUS | Status: DC | PRN
  Administered 2011-11-29 (×3): 10 mg via INTRAVENOUS
  Administered 2011-11-29: 50 mg via INTRAVENOUS

## 2011-11-29 MED ORDER — BUPIVACAINE-EPINEPHRINE 0.25% -1:200000 IJ SOLN
INTRAMUSCULAR | Status: AC
  Filled 2011-11-29: qty 1

## 2011-11-29 MED ORDER — CEFAZOLIN SODIUM-DEXTROSE 2-3 GM-% IV SOLR
2.0000 g | INTRAVENOUS | Status: AC
  Administered 2011-11-29: 2 g via INTRAVENOUS

## 2011-11-29 MED ORDER — DIPHENHYDRAMINE HCL 50 MG/ML IJ SOLN
12.5000 mg | Freq: Four times a day (QID) | INTRAMUSCULAR | Status: DC | PRN
Start: 2011-11-29 — End: 2011-12-01

## 2011-11-29 MED ORDER — DIPHENHYDRAMINE HCL 12.5 MG/5ML PO ELIX: 12.5000 mg | ORAL_SOLUTION | Freq: Four times a day (QID) | ORAL | Status: DC | PRN

## 2011-11-29 SURGICAL SUPPLY — 71 items
APPLICATOR SURGIFLO ENDO (HEMOSTASIS) ×2 IMPLANT
CANNULA SEAL DVNC (CANNULA) ×3 IMPLANT
CANNULA SEALS DA VINCI (CANNULA) ×3
CHLORAPREP W/TINT 26ML (MISCELLANEOUS) ×2 IMPLANT
CLIP LIGATING HEM O LOK PURPLE (MISCELLANEOUS) ×2 IMPLANT
CLIP LIGATING HEMO O LOK GREEN (MISCELLANEOUS) ×4 IMPLANT
CLIP SUT LAPRA TY ABSORB (SUTURE) ×4 IMPLANT
CLOTH BEACON ORANGE TIMEOUT ST (SAFETY) ×2 IMPLANT
CORD HIGH FREQUENCY UNIPOLAR (ELECTROSURGICAL) ×2 IMPLANT
CORDS BIPOLAR (ELECTRODE) ×2 IMPLANT
COVER SURGICAL LIGHT HANDLE (MISCELLANEOUS) ×2 IMPLANT
COVER TIP SHEARS 8 DVNC (MISCELLANEOUS) ×1 IMPLANT
COVER TIP SHEARS 8MM DA VINCI (MISCELLANEOUS) ×1
DECANTER SPIKE VIAL GLASS SM (MISCELLANEOUS) IMPLANT
DERMABOND ADVANCED (GAUZE/BANDAGES/DRESSINGS) ×1
DERMABOND ADVANCED .7 DNX12 (GAUZE/BANDAGES/DRESSINGS) ×1 IMPLANT
DRAIN CHANNEL 15F RND FF 3/16 (WOUND CARE) ×2 IMPLANT
DRAPE INCISE 23X17 IOBAN STRL (DRAPES) ×1
DRAPE INCISE IOBAN 23X17 STRL (DRAPES) ×1 IMPLANT
DRAPE INCISE IOBAN 66X45 STRL (DRAPES) ×2 IMPLANT
DRAPE LAPAROSCOPIC ABDOMINAL (DRAPES) ×2 IMPLANT
DRAPE LG THREE QUARTER DISP (DRAPES) ×4 IMPLANT
DRAPE SLUSH/WARMER DISC (DRAPES) ×2 IMPLANT
DRAPE TABLE BACK 44X90 PK DISP (DRAPES) ×2 IMPLANT
DRAPE WARM FLUID 44X44 (DRAPE) ×2 IMPLANT
DRESSING SURGICEL FIBRLLR 1X2 (HEMOSTASIS) ×1 IMPLANT
DRSG SURGICEL FIBRILLAR 1X2 (HEMOSTASIS) ×2
ELECT REM PT RETURN 9FT ADLT (ELECTROSURGICAL) ×4
ELECTRODE REM PT RTRN 9FT ADLT (ELECTROSURGICAL) ×2 IMPLANT
EVACUATOR SILICONE 100CC (DRAIN) ×2 IMPLANT
GAUZE VASELINE 3X9 (GAUZE/BANDAGES/DRESSINGS) IMPLANT
GLOVE BIOGEL M STRL SZ7.5 (GLOVE) ×4 IMPLANT
GOWN STRL NON-REIN LRG LVL3 (GOWN DISPOSABLE) ×14 IMPLANT
HEMOSTAT SURGICEL 4X8 (HEMOSTASIS) IMPLANT
KIT ACCESSORY DA VINCI DISP (KITS) ×1
KIT ACCESSORY DVNC DISP (KITS) ×1 IMPLANT
KIT BASIN OR (CUSTOM PROCEDURE TRAY) ×2 IMPLANT
NS IRRIG 1000ML POUR BTL (IV SOLUTION) ×2 IMPLANT
PEN SKIN MARKING BROAD (MISCELLANEOUS) IMPLANT
PENCIL BUTTON HOLSTER BLD 10FT (ELECTRODE) ×2 IMPLANT
POSITIONER SURGICAL ARM (MISCELLANEOUS) IMPLANT
POUCH SPECIMEN RETRIEVAL 10MM (ENDOMECHANICALS) IMPLANT
SET TUBE IRRIG SUCTION NO TIP (IRRIGATION / IRRIGATOR) ×2 IMPLANT
SOLUTION ANTI FOG 6CC (MISCELLANEOUS) ×2 IMPLANT
SOLUTION ELECTROLUBE (MISCELLANEOUS) ×2 IMPLANT
SPONGE LAP 18X18 X RAY DECT (DISPOSABLE) IMPLANT
SURGIFLO W/THROMBIN 8M KIT (HEMOSTASIS) ×2 IMPLANT
SUT ETHILON 3 0 PS 1 (SUTURE) ×2 IMPLANT
SUT MNCRL AB 4-0 PS2 18 (SUTURE) ×4 IMPLANT
SUT MON AB 2-0 SH 27 (SUTURE) IMPLANT
SUT MON AB 2-0 SH27 (SUTURE) IMPLANT
SUT V-LOC BARB 180 2/0GR6 GS22 (SUTURE) ×2
SUT VIC AB 0 CT1 27 (SUTURE) ×1
SUT VIC AB 0 CT1 27XBRD ANTBC (SUTURE) ×1 IMPLANT
SUT VIC AB 0 UR5 27 (SUTURE) IMPLANT
SUT VIC AB 2-0 SH 27 (SUTURE)
SUT VIC AB 2-0 SH 27X BRD (SUTURE) IMPLANT
SUT VIC AB 4-0 RB1 27 (SUTURE)
SUT VIC AB 4-0 RB1 27XBRD (SUTURE) IMPLANT
SUT VICRYL 0 UR6 27IN ABS (SUTURE) ×6 IMPLANT
SUTURE V-LC BRB 180 2/0GR6GS22 (SUTURE) ×1 IMPLANT
SYR BULB IRRIGATION 50ML (SYRINGE) ×2 IMPLANT
TOWEL OR NON WOVEN STRL DISP B (DISPOSABLE) ×2 IMPLANT
TRAY FOLEY CATH 14FRSI W/METER (CATHETERS) ×2 IMPLANT
TRAY LAP CHOLE (CUSTOM PROCEDURE TRAY) ×2 IMPLANT
TROCAR ENDOPATH XCEL 12X100 BL (ENDOMECHANICALS) ×2 IMPLANT
TROCAR XCEL 12X100 BLDLESS (ENDOMECHANICALS) ×2 IMPLANT
TROCAR XCEL NON-BLD 11X100MML (ENDOMECHANICALS) ×2 IMPLANT
TUBING INSUFFLATION 10FT LAP (TUBING) ×2 IMPLANT
V-LOC 180 ×2 IMPLANT
WATER STERILE IRR 1500ML POUR (IV SOLUTION) ×4 IMPLANT

## 2011-11-29 NOTE — Progress Notes (Signed)
Bowel prep done 11/28/11  And clear liq

## 2011-11-29 NOTE — Progress Notes (Signed)
B Met and Hgb. And Hct. Drawn by lab 

## 2011-11-29 NOTE — Anesthesia Postprocedure Evaluation (Signed)
  Anesthesia Post-op Note  Patient: [name]  Procedure(s) Performed: Procedure(s) (LRB): ROBOTIC ASSISTED LAPAROSCOPIC NEPHRECTOMY (Left)  Patient Location: PACU  Anesthesia Type: General  Level of Consciousness: awake and alert   Airway and Oxygen Therapy: Patient Spontanous Breathing  Post-op Pain: mild  Post-op Assessment: Post-op Vital signs reviewed, Patient's Cardiovascular Status Stable, Respiratory Function Stable, Patent Airway and No signs of Nausea or vomiting  Post-op Vital Signs: stable  Complications: No apparent anesthesia complications

## 2011-11-29 NOTE — Progress Notes (Signed)
B Met and Hgb. And Hct. Results noted. 

## 2011-11-29 NOTE — Op Note (Signed)
Preoperative diagnosis: Left renal neoplasm  Postoperative diagnosis: Left renal neoplasm  Procedure:  1. Left robotic-assisted laparoscopic partial nephrectomy  Surgeon: [name]. M.D.  Assistant(s): Pecola Leisure, PA-C  Anesthesia: General  Complications: None  EBL: 25 mL  IVF:  3000 mL crystalloid  Specimens: 1. Left renal neoplasm  Disposition of specimens: Pathology  Intraoperative findings:       1. Warm renal ischemia time: 25 minutes  Drains: 1. # 15 Blake perinephric drain  Indication:  [name] is a 71 y.o. year old patient with a left renal neoplasm.  After a thorough review of the management options for their renal mass, they elected to proceed with surgical treatment and the above procedure.  We have discussed the potential benefits and risks of the procedure, side effects of the proposed treatment, the likelihood of the patient achieving the goals of the procedure, and any potential problems that might occur during the procedure or recuperation. Informed consent has been obtained.   Description of procedure:  The patient was taken to the operating room and a general anesthetic was administered. The patient was given preoperative antibiotics, placed in the left modified flank position with care to pad all potential pressure points, and prepped and draped in the usual sterile fashion. Next a preoperative timeout was performed.  A site was selected on the left side of the umbilicus for placement of the camera port. This was placed using a standard open Hassan technique which allowed entry into the peritoneal cavity under direct vision and without difficulty.  A 12 mm port was placed and a pneumoperitoneum established. The camera was then used to inspect the abdomen and there was no evidence of any intra-abdominal injuries or other abnormalities. The patient did had a few omental adhesions but it was felt that the procedure could proceed in a  minimally invasive fashion. The remaining abdominal ports were then placed. 8 mm robotic ports were placed in the left upper quadrant, left lower quadrant, and far left lateral abdominal wall. A 12 mm port was placed in the upper midline for laparoscopic assistance after the adhesions had been taken down with laparoscopic scissors. All ports were placed under direct vision without difficulty. The surgical cart was then docked.   Utilizing the cautery scissors, the white line of Toldt was incised allowing the colon to be mobilized medially and the plane between the mesocolon and the anterior layer of Gerota's fascia to be developed and the kidney to be exposed.  The ureter and gonadal vein were identified inferiorly and the ureter was lifted anteriorly off the psoas muscle.  Dissection proceeded superiorly along the gonadal vein until the renal vein was identified.  The renal hilum was then carefully isolated with a combination of blunt and sharp dissection allowing the renal arterial and venous structures to be separated and isolated in preparation for renal hilar vessel clamping.  12.5 g of IV mannitol was then administered.   Attention turned to the kidney and the perinephric fat surrounding the renal mass was removed and the kidney was mobilized sufficiently for exposure and resection of the renal mass.   Once the renal mass was properly isolated, preparations were made for resection of the tumor.  Reconstructive sutures were placed into the abdomen for the renorrhaphy portion of the procedure.  A single trifurcating renal artery was identified and then clamped with bulldog clamps.  The tumor was then excised with cold scissor dissection along with an adequate visible gross margin  of normal renal parenchyma. The renal collecting system was entered during removal of the tumor.  A running 2-0 V-lock suture was then brought through the capsule of the kidney and run along the base of the renal defect to provide  hemostasis and close any entry into the renal collecting system. Weck clips were used to secure this suture outside the renal capsule at the proximal and distal ends. SurgiFlo was then placed into the renal defect and the renal parenchyma was closed with a 2-0 V-lock horizontal mattress capsular suture which resulted in excellent compression of the renal defect.    The bulldog clamps were then removed from the renal hilar vessel(s) and an additional 12.5 g of IV mannitol was administered. Total warm renal ischemia time was 25 minutes. The renal tumor resection site was examined. Hemostasis appeared adequate.   The kidney was placed back into it normal anatomic position and covered with perinephric fat as needed.  A # 15 Blake drain was then brought through the lateral lower port site and positioned in the perinephric space.  It was secured to the skin with a nylon suture. The surgical cart was undocked.  The renal tumor specimen was removed intact within an endopouch retrieval bag via the camera port sites.  The camera port site and the other 12 mm port site were then closed at the fascial level with 0-vicryl suture.  All other laparoscopic/robotic ports were removed under direct vision and the pneumoperitoneum let down with inspection of the operative field performed and hemostasis again confirmed. All incision sites were then injected with local anesthetic and reapproximated at the skin level with 4-0 monocryl subcuticular closures.  Dermabond was applied to the skin.  The patient tolerated the procedure well and without complications.  The patient was able to be extubated and transferred to the recovery unit in satisfactory condition.  [name] MD

## 2011-11-29 NOTE — Transfer of Care (Signed)
Immediate Anesthesia Transfer of Care Note  Patient: [name]  Procedure(s) Performed: Procedure(s) (LRB): ROBOTIC ASSISTED LAPAROSCOPIC NEPHRECTOMY (Left)  Patient Location: PACU  Anesthesia Type: General  Level of Consciousness: awake and alert   Airway & Oxygen Therapy: Patient Spontanous Breathing and Patient connected to face mask oxygen  Post-op Assessment: Report given to PACU RN and Post -op Vital signs reviewed and stable  Post vital signs: Reviewed and stable  Complications: No apparent anesthesia complications

## 2011-11-29 NOTE — Progress Notes (Signed)
Dr. Okey Dupre made aware of patient's obstructive sleep apnea screening score- no new orders obtained.

## 2011-11-29 NOTE — Progress Notes (Signed)
Patient ID: [name], male   DOB: 14-Sep-1940, 71 y.o.   MRN: [id number]  Post-op note  Subjective: The patient is doing well.  No complaints.  Objective: Vital signs in last 24 hours: Temp:  [97 F (36.1 C)-98.4 F (36.9 C)] 98.4 F (36.9 C) (03/18 1700) Pulse Rate:  [67-82] 70  (03/18 1700) Resp:  [12-20] 12  (03/18 1700) BP: (138-151)/(66-83) 142/75 mmHg (03/18 1700) SpO2:  [93 %-99 %] 94 % (03/18 1700) Weight:  [95.8 kg (211 lb 3.2 oz)] 95.8 kg (211 lb 3.2 oz) (03/18 1700)  Intake/Output from previous day:   Intake/Output this shift: Total I/O In: 3300 [I.V.:3300] Out: 405 [Urine:325; Drains:30; Blood:50]  Physical Exam:  General: Alert and oriented. Abdomen: Soft, Nondistended. Incisions: Clean and dry.  Lab Results:  Basename 11/29/11 1503  HGB 14.2  HCT 41.4    Assessment/Plan: POD#0   1) Continue to monitor   [name]. MD   LOS: 0 days   Krisandra Bueno,LES 11/29/2011, 6:50 PM

## 2011-11-29 NOTE — OR Nursing (Signed)
Left renal artery clamp time 1317 to 1342

## 2011-11-29 NOTE — Progress Notes (Signed)
Pt had an asymptomatic episode of "slow VTach" about 10 PVC's in a row.  Pt denies any CP or SOB. Dr Margarita Grizzle notified. Will continue to monitor [name], [name]

## 2011-11-29 NOTE — Progress Notes (Signed)
I was notified by the patient's nurse that he had 8-10 abnormal beats of his heart. She stated it looked like very slow V-tach. The patient is asymptomatic. I asked her to continue to monitor this duration and notify me if he becomes symptomatic or if his increase in frequency or duration.

## 2011-11-29 NOTE — Interval H&P Note (Signed)
History and Physical Interval Note:  11/29/2011 10:12 AM  [name]  has presented today for surgery, with the diagnosis of Left Renal Neoplasm  The various methods of treatment have been discussed with the patient and family. After consideration of risks, benefits and other options for treatment, the patient has consented to  Procedure(s) (LRB): ROBOTIC ASSISTED LAPAROSCOPIC PARTIAL NEPHRECTOMY (Left) as a surgical intervention .  The patients' history has been reviewed, patient examined, no change in status, stable for surgery.  He stopped Pradaxa 48 hrs ago. Questions were answered to the patient's satisfaction.     [name],LES

## 2011-11-29 NOTE — Anesthesia Procedure Notes (Signed)
Procedure Name: Intubation Date/Time: 11/29/2011 11:25 AM Performed by: [place], Edrees Valent C Pre-anesthesia Checklist: Patient identified, Timeout performed, Emergency Drugs available, Suction available and Patient being monitored Patient Re-evaluated:Patient Re-evaluated prior to inductionOxygen Delivery Method: Circle system utilized Preoxygenation: Pre-oxygenation with 100% oxygen Intubation Type: IV induction Ventilation: Mask ventilation without difficulty and Oral airway inserted - appropriate to patient size Laryngoscope Size: Mac and 4 Grade View: Grade II Tube type: Oral Tube size: 7.5 mm Number of attempts: 1 Airway Equipment and Method: Stylet Placement Confirmation: ETT inserted through vocal cords under direct vision,  breath sounds checked- equal and bilateral,  positive ETCO2 and CO2 detector Secured at: 21 cm Tube secured with: Tape Dental Injury: Teeth and Oropharynx as per pre-operative assessment

## 2011-11-29 NOTE — Anesthesia Preprocedure Evaluation (Addendum)
Anesthesia Evaluation  Patient identified by MRN, date of birth, ID band Patient awake    Reviewed: Allergy & Precautions, H&P , NPO status , Patient's Chart, lab work & pertinent test results  Airway Mallampati: II TM Distance: <3 FB Neck ROM: Full    Dental No notable dental hx.    Pulmonary COPD breath sounds clear to auscultation  Pulmonary exam normal       Cardiovascular hypertension, Pt. on medications + CAD, + Past MI and + CABG + dysrhythmias Atrial Fibrillation Rhythm:Regular Rate:Normal     Neuro/Psych negative neurological ROS  negative psych ROS   GI/Hepatic negative GI ROS, Neg liver ROS,   Endo/Other  Diabetes mellitus-, Type 2  Renal/GU negative Renal ROS  negative genitourinary   Musculoskeletal negative musculoskeletal ROS (+)   Abdominal   Peds negative pediatric ROS (+)  Hematology negative hematology ROS (+)   Anesthesia Other Findings   Reproductive/Obstetrics negative OB ROS                           Anesthesia Physical Anesthesia Plan  ASA: III  Anesthesia Plan: General   Post-op Pain Management:    Induction: Intravenous  Airway Management Planned: Oral ETT  Additional Equipment:   Intra-op Plan:   Post-operative Plan: Extubation in OR  Informed Consent: I have reviewed the patients History and Physical, chart, labs and discussed the procedure including the risks, benefits and alternatives for the proposed anesthesia with the patient or authorized representative who has indicated his/her understanding and acceptance.   Dental advisory given  Plan Discussed with: CRNA  Anesthesia Plan Comments:         Anesthesia Quick Evaluation

## 2011-11-30 ENCOUNTER — Other Ambulatory Visit: Payer: Self-pay

## 2011-11-30 LAB — BASIC METABOLIC PANEL
BUN: 11 mg/dL (ref 6–23)
CO2: 27 mEq/L (ref 19–32)
CO2: 27 mEq/L (ref 19–32)
Calcium: 8.1 mg/dL — ABNORMAL LOW (ref 8.4–10.5)
Calcium: 8.4 mg/dL (ref 8.4–10.5)
Chloride: 100 mEq/L (ref 96–112)
Creatinine, Ser: 1.17 mg/dL (ref 0.50–1.35)
Creatinine, Ser: 1.19 mg/dL (ref 0.50–1.35)
GFR calc non Af Amer: 60 mL/min — ABNORMAL LOW (ref >90)
Glucose, Bld: 130 mg/dL — ABNORMAL HIGH (ref 70–99)

## 2011-11-30 MED ORDER — BISACODYL 10 MG RE SUPP
10.0000 mg | Freq: Once | RECTAL | Status: AC
  Administered 2011-11-30: 10 mg via RECTAL
  Filled 2011-11-30: qty 1

## 2011-11-30 MED ORDER — HYDROCODONE-ACETAMINOPHEN 5-325 MG PO TABS
1.0000 | ORAL_TABLET | Freq: Four times a day (QID) | ORAL | Status: DC | PRN
  Administered 2011-12-01 (×2): 2 via ORAL
  Filled 2011-11-30 (×2): qty 2

## 2011-11-30 MED ORDER — DOCUSATE SODIUM 100 MG PO CAPS: 100.0000 mg | ORAL_CAPSULE | Freq: Two times a day (BID) | ORAL | Status: DC

## 2011-11-30 MED ORDER — ASPIRIN EC 81 MG PO TBEC
81.0000 mg | DELAYED_RELEASE_TABLET | Freq: Every day | ORAL | Status: DC
  Administered 2011-11-30 – 2011-12-01 (×2): 81 mg via ORAL
  Filled 2011-11-30 (×3): qty 1

## 2011-11-30 NOTE — Consult Note (Signed)
CARDIOLOGY CONSULT NOTE  Patient ID: [name] MRN: [id number] DOB/AGE: [birth year] 71 y.o.  Admit date: 11/29/2011 Referring Physician: Crecencio Mc, MD Primary Physician: Benita Stabile, MD, MD Reason for Consultation A. Fibrillation and ventricular pauses.  HPI: Patient admitted for elective nephrectomy for a malignant lesion. Has h/o CAD and new onset A. Fibrillation in the last one month. Presently doing well and patient denies any chest pain, shortness of breath, palpitations. He has no specific complaints today and states that he is extremely pleased with the surgery has gone well. All his family members are present at the bedside.  Past Medical History  Diagnosis Date  . CAD (coronary artery disease)   . Acute non-ST segment elevation myocardial infarction   . Left renal mass   . Diverticulitis   . COPD (chronic obstructive pulmonary disease)   . HTN (hypertension)   . Myocardial infarction 06/19/2011  . Ulcer 1960'S  . Renal neoplasm     LEFT  . Blood transfusion   . Diverticulitis 1996  . DM2 (diabetes mellitus, type 2)   . Depression      Past Surgical History  Procedure Date  . Coronary artery bypass grafting x4 06/28/11    Dr Tyrone Sage  . Hernia repair surgery x4.   . Coronary artery bypass graft 06/2011    4 VESSELS  . Colon resection 2005    FOR DIVERTICULITIS     Family History  Problem Relation Age of Onset  . Heart disease Father   . Alzheimer's disease Mother   . Alcohol abuse Brother     Social History: History   Social History  . Marital Status: Married    Spouse Name: N/A    Number of Children: 3  . Years of Education: N/A   Occupational History  .     Social History Main Topics  . Smoking status: Former Smoker    Types: Cigarettes    Quit date: 06/19/2011  . Smokeless tobacco: Never Used  . Alcohol Use: No  . Drug Use: No  . Sexually Active: Not on file   Other Topics Concern  . Not on file   Social History Narrative  .  No narrative on file     Prescriptions prior to admission  Medication Sig Dispense Refill  . aspirin 81 MG EC tablet Take 81 mg by mouth daily after breakfast.      . digoxin (LANOXIN) 0.25 MG tablet Take 250 mcg by mouth daily after breakfast.      . diltiazem (DILACOR XR) 120 MG 24 hr capsule Take 120 mg by mouth at bedtime.      Marland Kitchen escitalopram (LEXAPRO) 10 MG tablet Take 10 mg by mouth every evening.      . fenofibrate (TRICOR) 145 MG tablet Take 145 mg by mouth daily after breakfast.       . furosemide (LASIX) 20 MG tablet Take 20 mg by mouth daily as needed. Fluid      . lisinopril (PRINIVIL,ZESTRIL) 2.5 MG tablet Take 2.5 mg by mouth daily after breakfast.       . metoprolol (LOPRESSOR) 50 MG tablet Take 25 mg by mouth 2 (two) times daily.       Marland Kitchen omega-3 acid ethyl esters (LOVAZA) 1 G capsule Take 2 g by mouth 2 (two) times daily.       . polyvinyl alcohol (LIQUIFILM TEARS) 1.4 % ophthalmic solution Place 1 drop into both eyes as needed. Dry eyes      .  ALPRAZolam (XANAX) 0.5 MG tablet Take 1 tablet (0.5 mg total) by mouth 3 (three) times daily as needed for anxiety.  90 tablet  2  . dabigatran (PRADAXA) 150 MG CAPS Take 1 capsule (150 mg total) by mouth every 12 (twelve) hours.  60 capsule  6  . nitroGLYCERIN (NITROSTAT) 0.4 MG SL tablet Place 0.4 mg under the tongue every 5 (five) minutes as needed.        ROS: General: no fevers/chills/night sweats Eyes: no blurry vision, diplopia, or amaurosis ENT: no sore throat or hearing loss Resp: Has mild chronic cough, No wheezing, or hemoptysis CV: no edema or palpitations GI: no abdominal pain, nausea, vomiting, diarrhea, or constipation GU: no dysuria, frequency, or hematuria Skin: no rash Neuro: no headache, numbness, tingling, or weakness of extremities Musculoskeletal: no joint pain or swelling Heme: no bleeding, DVT, or easy bruising Endo: no polydipsia or polyuria    Physical Exam: Blood pressure 109/65, pulse 82,  temperature 98.6 F (37 C), temperature source Oral, resp. rate 14, height 6' (1.829 m), weight 95.8 kg (211 lb 3.2 oz), SpO2 92.00%.  Pt is alert and oriented, WD, WN, in no distress. HEENT: normal Neck: JVP normal. Carotid upstrokes normal without bruits. No thyromegaly. Lungs: equal expansion, decreased breath sounds at bases with scattered crackles.  CV:  S1 variable and irregular, S2 normal, No murmur or gallop. Abd: soft, NT, +BS, no bruit, no hepatosplenomegaly. Percutaneous drain tube and vacuum seal noted. Back: no CVA tenderness Ext: no C/C/E        Femoral pulses 2+= without bruits        DP/PT pulses intact and = Skin: warm and dry without rash Neuro: CNII-XII intact             Strength intact = bilaterally  Labs:   Lab Results  Component Value Date   WBC 9.2 11/23/2011   HGB 13.2 11/30/2011   HCT 39.6 11/30/2011   MCV 84.9 11/23/2011   PLT 154 11/23/2011    Lab 11/30/11 1133  NA 136  K 3.9  CL 100  CO2 27  BUN 11  CREATININE 1.19  CALCIUM 8.4  PROT --  BILITOT --  ALKPHOS --  ALT --  AST --  GLUCOSE 146*     EKG: A. Fibrillation with posterior infarct, old. PVC. Non specific ST-T changes without ischemia.   ASSESSMENT AND PLAN:   1. A. Fibrillation with Controlled ventricular response. HR well contolled and 2.1 to 2.6 second pause are not significant. Also I evaluated the telemetry rhythm strips. There is PVC and short run ventricular bigemini, but wide complex tachycardia is an artifact and he has not had any ventricular tachycardia. Patient also asymptomatic.    ECG 11/22/11: A. Fibrillation V rate 68/min. R in V1-2 posterior MI old. Non specific ST changes. ECG 10/31/11: A. Fibrillation with V rate 76/min. PVC, non specific ST-T changes. Post CABG Stress EKG 08/12/11 Frequent PVC: rest, initial exercise, recovery.     Less at peak.     7.3 MET.     Rehab OK.    2. CAD/ASHD  H/O  NSTEMI S/P CABGs on 06/28/2011.   LIMA to LAD, SVG to circumflex, sequential  SVG to PDA and PL of RCA. Ofilia Neas, MD 3. Hypertension at goal.    4. Hyperlipidemia.    5. Diabetes Mellitus II new onset since admssion to the hospital with NQMI.  Diet controlled only.  6. Left renal mass S/P robotic assisted right nephrectomy.  7. Tobacco use disorder: Has started to smoke again.  Plan: I would like him to start anticoagulation with Pradaxa when appropriate. I would also request surgical colleagues to at least consider ASA at the earliest when it is safe (anticoagulation issue notes written by Dr. Laverle Patter was noted by me). Otherwise on appropriate therapy. Again discussed total abstinence from tobacco. Thanks for the consult, Vonna Kotyk. My cell: [phone] and office [phone].  Pamella Pert, MD 11/30/2011, 7:17 PM

## 2011-11-30 NOTE — Progress Notes (Signed)
Patient ID: [name], male   DOB: [birth year], 71 y.o.   MRN: [id number]  Pt noted to have ectopy and a long pause in heart rhythm on telemetry.  He has been asymptomatic.  He has not been tachycardic.  No complaints at this time.  Will check electrolyes, Mg, and 12-lead ECG.  Will ask Dr. Jacinto Halim or his associate to see patient in consultation for any recommendations/medication changes.    Due to risk of bleeding, need to hold anticoagulation and aspirin at this time.  Will check hemoglobin in the morning and make determination about when to restart anticoagulation.

## 2011-11-30 NOTE — Progress Notes (Signed)
Pt having multiple episodes of ectopy. Pt Asymptomatic at this time. Dr Laverle Patter made aware. Orders placed as ordered. [name]

## 2011-11-30 NOTE — Progress Notes (Signed)
Patient ID: [name], male   DOB: 13-Mar-1941, 71 y.o.   MRN: [id number]  1 Day Post-Op Subjective: The patient is doing well.  No nausea or vomiting. Pain is adequately controlled.  Objective: Vital signs in last 24 hours: Temp:  [97 F (36.1 C)-98.4 F (36.9 C)] 98 F (36.7 C) (03/19 0433) Pulse Rate:  [66-82] 66  (03/19 0433) Resp:  [12-20] 12  (03/19 0433) BP: (108-151)/(63-83) 108/63 mmHg (03/19 0433) SpO2:  [93 %-99 %] 93 % (03/19 0433) Weight:  [95.8 kg (211 lb 3.2 oz)] 95.8 kg (211 lb 3.2 oz) (03/18 1700)  Intake/Output from previous day: 03/18 0701 - 03/19 0700 In: 5607.9 [P.O.:360; I.V.:4847.9; IV Piggyback:400] Out: 1095 [Urine:930; Drains:115; Blood:50] Intake/Output this shift:    Physical Exam:  General: Alert and oriented. CV: RRR Lungs: Clear bilaterally. GI: Soft, Nondistended. Incisions: Clean and dry. Urine: Clear Extremities: Nontender, no erythema, no edema.  Lab Results:  Basename 11/30/11 0501 11/29/11 1503  HGB 13.2 14.2  HCT 39.6 41.4          Basename 11/30/11 0501 11/29/11 1503 11/23/11 1100  CREATININE 1.17 1.00 0.91           Results for orders placed during the hospital encounter of 11/29/11 (from the past 24 hour(s))  PROTIME-INR     Status: Normal   Collection Time   11/29/11  8:45 AM      Component Value Range   Prothrombin Time 13.4  11.6 - 15.2 (seconds)   INR 1.00  0.00 - 1.49   TYPE AND SCREEN     Status: Normal   Collection Time   11/29/11  8:45 AM      Component Value Range   ABO/RH(D) O POS     Antibody Screen NEG     Sample Expiration 12/02/2011    ABO/RH     Status: Normal   Collection Time   11/29/11  8:45 AM      Component Value Range   ABO/RH(D) O POS    BASIC METABOLIC PANEL     Status: Abnormal   Collection Time   11/29/11  3:03 PM      Component Value Range   Sodium 136  135 - 145 (mEq/L)   Potassium 3.7  3.5 - 5.1 (mEq/L)   Chloride 103  96 - 112 (mEq/L)   CO2 25  19 - 32 (mEq/L)   Glucose, Bld 150  (*) 70 - 99 (mg/dL)   BUN 12  6 - 23 (mg/dL)   Creatinine, Ser 0.27  0.50 - 1.35 (mg/dL)   Calcium 8.3 (*) 8.4 - 10.5 (mg/dL)   GFR calc non Af Amer 73 (*) >90 (mL/min)   GFR calc Af Amer 85 (*) >90 (mL/min)  HEMOGLOBIN AND HEMATOCRIT, BLOOD     Status: Normal   Collection Time   11/29/11  3:03 PM      Component Value Range   Hemoglobin 14.2  13.0 - 17.0 (g/dL)   HCT 25.3  66.4 - 40.3 (%)  BASIC METABOLIC PANEL     Status: Abnormal   Collection Time   11/30/11  5:01 AM      Component Value Range   Sodium 136  135 - 145 (mEq/L)   Potassium 3.6  3.5 - 5.1 (mEq/L)   Chloride 101  96 - 112 (mEq/L)   CO2 27  19 - 32 (mEq/L)   Glucose, Bld 130 (*) 70 - 99 (mg/dL)   BUN 12  6 -  23 (mg/dL)   Creatinine, Ser 1.61  0.50 - 1.35 (mg/dL)   Calcium 8.1 (*) 8.4 - 10.5 (mg/dL)   GFR calc non Af Amer 60 (*) >90 (mL/min)   GFR calc Af Amer 70 (*) >90 (mL/min)  HEMOGLOBIN AND HEMATOCRIT, BLOOD     Status: Normal   Collection Time   11/30/11  5:01 AM      Component Value Range   Hemoglobin 13.2  13.0 - 17.0 (g/dL)   HCT 09.6  04.5 - 40.9 (%)    Assessment/Plan: POD# 1 s/p robotic partial nephrectomy.  1) Ambulate, Incentive spirometry 2) Advance diet as tolerated 3) Transition to oral pain medication 4) Dulcolax suppository 5) D/C urethral catheter   [name]. MD   LOS: 1 day   Cadey Bazile,LES 11/30/2011, 7:14 AM

## 2011-12-01 LAB — BASIC METABOLIC PANEL
BUN: 14 mg/dL (ref 6–23)
CO2: 25 mEq/L (ref 19–32)
Chloride: 100 mEq/L (ref 96–112)
Creatinine, Ser: 1.12 mg/dL (ref 0.50–1.35)
Glucose, Bld: 132 mg/dL — ABNORMAL HIGH (ref 70–99)

## 2011-12-01 LAB — CREATININE, FLUID (PLEURAL, PERITONEAL, JP DRAINAGE)

## 2011-12-01 LAB — HEMOGLOBIN AND HEMATOCRIT, BLOOD
HCT: 39 % (ref 39.0–52.0)
Hemoglobin: 13.4 g/dL (ref 13.0–17.0)

## 2011-12-01 MED ORDER — FUROSEMIDE 20 MG PO TABS
20.0000 mg | ORAL_TABLET | Freq: Once | ORAL | Status: AC
  Administered 2011-12-01: 20 mg via ORAL
  Filled 2011-12-01: qty 1

## 2011-12-01 MED ORDER — DOCUSATE SODIUM 100 MG PO CAPS: 100.0000 mg | ORAL_CAPSULE | Freq: Two times a day (BID) | ORAL | Status: AC

## 2011-12-01 MED ORDER — HYDROCODONE-ACETAMINOPHEN 5-300 MG PO TABS: 1.0000 | ORAL_TABLET | Freq: Four times a day (QID) | ORAL | Status: DC

## 2011-12-01 NOTE — Discharge Instructions (Signed)
1.  Activity:  You are encouraged to ambulate frequently (about every hour during waking hours) to help prevent blood clots from forming in your legs or lungs.  However, you should not engage in any heavy lifting (> 10-15 lbs), strenuous activity, or straining. 2. Diet: You should advance your diet as instructed by your physician.  It will be normal to have some bloating, nausea, and abdominal discomfort intermittently. 3. Prescriptions:  You will be provided a prescription for pain medication to take as needed.  If your pain is not severe enough to require the prescription pain medication, you may take extra strength Tylenol instead which will have less side effects.  You should also take a prescribed stool softener to avoid straining with bowel movements as the prescription pain medication may constipate you. 4. Incisions: You may remove your dressing bandages 48 hours after surgery if not removed in the hospital.  You will either have some small staples or special tissue glue at each of the incision sites. Once the bandages are removed (if present), the incisions may stay open to air.  You may start showering (but not soaking or bathing in water) the 2nd day after surgery and the incisions simply need to be patted dry after the shower.  No additional care is needed. 5. What to call us about: You should call the office [phone]) if you develop fever > 101 or develop persistent vomiting.   Restart Pradaxa Sunday evening.  Call if you develop blood in the urine or pain in the abdomen or flank after starting blood thinner again.

## 2011-12-01 NOTE — Discharge Summary (Signed)
Date of admission: 11/29/2011  Date of discharge: 12/01/2011  Admission diagnosis: Left renal mass  Discharge diagnosis: Renal cell carcinoma   Secondary diagnoses: Atrial fibrillation  History and Physical: For full details, please see admission history and physical. Briefly, [name] is a 71 y.o. year old patient with a left renal mass concerning for malignancy.  He elected to undergo surgical treatment.   Hospital Course: He was taken to the OR on 11/29/11 after having stopped his aspirin and Pradaxa preoperatively at the recommended time.  He underwent a left robotic assisted laparoscopic partial nephrectomy without complications.  He was able to be transferred to a regular hospital room and was kept on strict bedrest.  He began ambulating on POD #1 without difficulty.  His Hgb remained stable and his renal function remained stable as well.  He did develop a few long pauses in his heart rhythm and some ectopy.  Dr. Jacinto Halim was consulted and did not recommend changes in his medical regimen but did recommend restarting his aspirin and Pradaxa as soon as was appropriate.  He restarted ASA 81 mg the night of POD#1 and remained stable.  On POD#2, he was tolerating a regular diet and his pain was controlled with po pain medication. His oxygen saturation level was mildly low off oxygen and he was administered furosemide and his oxygen saturation subsequently improved. He was felt to be stable for discharge on the evening of POD# 2.  Laboratory values:  Basename 12/01/11 1450 12/01/11 0528 11/30/11 0501  HGB 13.4 13.0 13.2  HCT 39.0 38.6* 39.6    Basename 12/01/11 0528 11/30/11 1133  CREATININE 1.12 1.19    Disposition: Home  Discharge instruction: The patient was instructed to be ambulatory but told to refrain from heavy lifting, strenuous activity, or driving.  He will continue his ASA and will restart his Pradaxa Sunday night (due to the risk of bleeding if started too soon).  Discharge  medications:  Medication List  As of 12/01/2011  5:56 PM   START taking these medications         docusate sodium 100 MG capsule   Commonly known as: COLACE   Take 1 capsule (100 mg total) by mouth 2 (two) times daily.      * HYDROcodone-acetaminophen 5-325 MG per tablet   Commonly known as: NORCO   Take 1-2 tablets by mouth every 6 (six) hours as needed for pain.      * Hydrocodone-Acetaminophen 5-300 MG Tabs   Take 1-2 tablets by mouth every 6 (six) hours.     * Notice: This list has 2 medication(s) that are the same as other medications prescribed for you. Read the directions carefully, and ask your doctor or other care provider to review them with you.       CONTINUE taking these medications         aspirin 81 MG EC tablet      digoxin 0.25 MG tablet   Commonly known as: LANOXIN      diltiazem 120 MG 24 hr capsule   Commonly known as: DILACOR XR      escitalopram 10 MG tablet   Commonly known as: LEXAPRO      fenofibrate 145 MG tablet   Commonly known as: TRICOR      furosemide 20 MG tablet   Commonly known as: LASIX      lisinopril 2.5 MG tablet   Commonly known as: PRINIVIL,ZESTRIL      metoprolol 50 MG tablet  Commonly known as: LOPRESSOR      nitroGLYCERIN 0.4 MG SL tablet   Commonly known as: NITROSTAT      omega-3 acid ethyl esters 1 G capsule   Commonly known as: LOVAZA      polyvinyl alcohol 1.4 % ophthalmic solution   Commonly known as: LIQUIFILM TEARS         STOP taking these medications               dabigatran 150 MG Caps (until Sunday)          Where to get your medications    These are the prescriptions that you need to pick up.   You may get these medications from any pharmacy.         docusate sodium 100 MG capsule   Hydrocodone-Acetaminophen 5-300 MG Tabs               Followup:  Follow-up Information    Follow up with Crecencio Mc, MD on 12/28/2011. (at 10:00)    Contact information:   [address] [place]  [facility] [place] [address] [phone]

## 2011-12-01 NOTE — Progress Notes (Signed)
Patient ID: [name], male   DOB: [birth year], 71 y.o.   MRN: [id number]  2 Days Post-Op Subjective: The patient is doing well.  No nausea or vomiting. Pain is adequately controlled.  Dr. Verl Gordon notes reviewed.  Pt started on aspirin 81 mg last night.  No hematuria.  Objective: Vital signs in last 24 hours: Temp:  [98.6 F (37 C)-99 F (37.2 C)] 98.9 F (37.2 C) (03/20 0554) Pulse Rate:  [82-101] 85  (03/20 0554) Resp:  [14-16] 16  (03/20 0554) BP: (106-141)/(65-79) 106/66 mmHg (03/20 0554) SpO2:  [89 %-92 %] 89 % (03/20 0554)  Intake/Output from previous day: 03/19 0701 - 03/20 0700 In: 1105 [P.O.:480; I.V.:525; IV Piggyback:100] Out: 720 [Urine:640; Drains:80] Intake/Output this shift: Total I/O In: -  Out: 245 [Urine:190; Drains:55]  Physical Exam:  General: Alert and oriented. CV: RRR Lungs: Clear bilaterally. GI: Soft, Nondistended. Incisions: Clean and dry. Urine: Clear Extremities: Nontender, no erythema, no edema.  Lab Results:  Basename 12/01/11 0528 11/30/11 0501 11/29/11 1503  HGB 13.0 13.2 14.2  HCT 38.6* 39.6 41.4          Basename 12/01/11 0528 11/30/11 1133 11/30/11 0501  CREATININE 1.12 1.19 1.17           Results for orders placed during the hospital encounter of 11/29/11 (from the past 24 hour(s))  BASIC METABOLIC PANEL     Status: Abnormal   Collection Time   11/30/11 11:33 AM      Component Value Range   Sodium 136  135 - 145 (mEq/L)   Potassium 3.9  3.5 - 5.1 (mEq/L)   Chloride 100  96 - 112 (mEq/L)   CO2 27  19 - 32 (mEq/L)   Glucose, Bld 146 (*) 70 - 99 (mg/dL)   BUN 11  6 - 23 (mg/dL)   Creatinine, Ser 1.19  0.50 - 1.35 (mg/dL)   Calcium 8.4  8.4 - 14.7 (mg/dL)   GFR calc non Af Amer 59 (*) >90 (mL/min)   GFR calc Af Amer 69 (*) >90 (mL/min)  MAGNESIUM     Status: Normal   Collection Time   11/30/11 11:33 AM      Component Value Range   Magnesium 1.9  1.5 - 2.5 (mg/dL)  BASIC METABOLIC PANEL     Status: Abnormal   Collection  Time   12/01/11  5:28 AM      Component Value Range   Sodium 133 (*) 135 - 145 (mEq/L)   Potassium 3.5  3.5 - 5.1 (mEq/L)   Chloride 100  96 - 112 (mEq/L)   CO2 25  19 - 32 (mEq/L)   Glucose, Bld 132 (*) 70 - 99 (mg/dL)   BUN 14  6 - 23 (mg/dL)   Creatinine, Ser 8.29  0.50 - 1.35 (mg/dL)   Calcium 8.6  8.4 - 56.2 (mg/dL)   GFR calc non Af Amer 64 (*) >90 (mL/min)   GFR calc Af Amer 74 (*) >90 (mL/min)  HEMOGLOBIN AND HEMATOCRIT, BLOOD     Status: Abnormal   Collection Time   12/01/11  5:28 AM      Component Value Range   Hemoglobin 13.0  13.0 - 17.0 (g/dL)   HCT 13.0 (*) 86.5 - 52.0 (%)   Path: pT1a Nx Mx, Fuhrman grade II, 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  1) Ambulate, Incentive spirometry 2) Check drain creatinine level 3) Sl IVF 4) Monitor hemoglobin.  Will still need to hold Pradaxa for a few days due to increased risk of bleeding but have restarted aspirin. 5) Dr. Verl Gordon input appreciated.  No medication changes required at this time 6) Hypoxia and low UOP:  Will give additional Lasix this morning.  Encouraged IS. 7) Path reviewed with patient and daughter.  Prognosis is excellent. 8) Possible D/C home later today or tomorrow.  [name]. MD   LOS: 2 days   [name],LES 12/01/2011, 6:57 AM

## 2011-12-01 NOTE — Progress Notes (Signed)
Patient ID: [name], male   DOB: 10-03-1940, 71 y.o.   MRN: [id number]   Pt doing well today.  Oxygen saturation now 95% and routinely > 90% on room air.  JP drain removed after drainage c/w serum.  Hgb stable on aspirin and no hematuria.  D/C home.

## 2011-12-08 ENCOUNTER — Encounter (HOSPITAL_COMMUNITY): Payer: Self-pay | Admitting: Urology

## 2011-12-27 ENCOUNTER — Encounter (HOSPITAL_COMMUNITY): Payer: Self-pay | Admitting: Pharmacy Technician

## 2012-01-11 ENCOUNTER — Encounter (HOSPITAL_COMMUNITY): Payer: Self-pay | Admitting: Anesthesiology

## 2012-01-11 ENCOUNTER — Ambulatory Visit (HOSPITAL_COMMUNITY)
Admission: RE | Admit: 2012-01-11 | Discharge: 2012-01-11 | Disposition: A | Discharge status: Home / Self Care | Payer: Medicare Other | Source: Ambulatory Visit | Attending: Cardiology | Admitting: Cardiology

## 2012-01-11 ENCOUNTER — Ambulatory Visit (HOSPITAL_COMMUNITY): Payer: Medicare Other | Admitting: Anesthesiology

## 2012-01-11 ENCOUNTER — Encounter (HOSPITAL_COMMUNITY)
Admission: RE | Disposition: A | Discharge status: Home / Self Care | Payer: Self-pay | Source: Ambulatory Visit | Attending: Cardiology

## 2012-01-11 DIAGNOSIS — F17200 Nicotine dependence, unspecified, uncomplicated: Secondary | ICD-10-CM | POA: Insufficient documentation

## 2012-01-11 DIAGNOSIS — J4489 Other specified chronic obstructive pulmonary disease: Secondary | ICD-10-CM | POA: Insufficient documentation

## 2012-01-11 DIAGNOSIS — I4891 Unspecified atrial fibrillation: Secondary | ICD-10-CM | POA: Insufficient documentation

## 2012-01-11 DIAGNOSIS — I10 Essential (primary) hypertension: Secondary | ICD-10-CM | POA: Insufficient documentation

## 2012-01-11 DIAGNOSIS — J449 Chronic obstructive pulmonary disease, unspecified: Secondary | ICD-10-CM | POA: Insufficient documentation

## 2012-01-11 DIAGNOSIS — I2510 Atherosclerotic heart disease of native coronary artery without angina pectoris: Secondary | ICD-10-CM | POA: Insufficient documentation

## 2012-01-11 DIAGNOSIS — E119 Type 2 diabetes mellitus without complications: Secondary | ICD-10-CM | POA: Insufficient documentation

## 2012-01-11 HISTORY — PX: CARDIOVERSION: SHX1299

## 2012-01-11 LAB — POCT I-STAT, CHEM 8
Chloride: 108 mEq/L (ref 96–112)
HCT: 43 % (ref 39.0–52.0)
Hemoglobin: 14.6 g/dL (ref 13.0–17.0)
Potassium: 3.8 mEq/L (ref 3.5–5.1)
Sodium: 141 mEq/L (ref 135–145)

## 2012-01-11 SURGERY — CARDIOVERSION
Anesthesia: General | Wound class: Clean

## 2012-01-11 MED ORDER — SODIUM CHLORIDE 0.9 % IJ SOLN: 3.0000 mL | Freq: Two times a day (BID) | INTRAMUSCULAR | Status: DC

## 2012-01-11 MED ORDER — SODIUM CHLORIDE 0.9 % IV SOLN
INTRAVENOUS | Status: DC | PRN
  Administered 2012-01-11: 10:00:00 via INTRAVENOUS

## 2012-01-11 MED ORDER — LIDOCAINE HCL 1 % IJ SOLN
INTRAMUSCULAR | Status: DC | PRN
  Administered 2012-01-11: 20 mg via INTRADERMAL

## 2012-01-11 MED ORDER — PROPOFOL 10 MG/ML IV BOLUS
INTRAVENOUS | Status: DC | PRN
  Administered 2012-01-11: 50 mg via INTRAVENOUS

## 2012-01-11 MED ORDER — SODIUM CHLORIDE 0.9 % IV SOLN: 250.0000 mL | INTRAVENOUS | Status: DC

## 2012-01-11 MED ORDER — SODIUM CHLORIDE 0.9 % IJ SOLN: 3.0000 mL | INTRAMUSCULAR | Status: DC | PRN

## 2012-01-11 MED ORDER — HYDROCORTISONE 1 % EX CREA
1.0000 "application " | TOPICAL_CREAM | Freq: Three times a day (TID) | CUTANEOUS | Status: DC | PRN
  Filled 2012-01-11: qty 28

## 2012-01-11 NOTE — Anesthesia Procedure Notes (Signed)
Date/Time: 01/11/2012 9:51 AM Performed by: Leona Singleton A Pre-anesthesia Checklist: Patient identified Patient Re-evaluated:Patient Re-evaluated prior to inductionOxygen Delivery Method: Ambu bag Preoxygenation: Pre-oxygenation with 100% oxygen Intubation Type: IV induction Ventilation: Mask ventilation without difficulty Dental Injury: Teeth and Oropharynx as per pre-operative assessment  Comments: Mask with ease throughout cardioversion. Natural airway patent w/o intervention

## 2012-01-11 NOTE — Interval H&P Note (Signed)
History and Physical Interval Note:  01/11/2012 9:12 AM  [name]  has presented today for surgery, with the diagnosis of AFIB  The various methods of treatment have been discussed with the patient and family. After consideration of risks, benefits and other options for treatment, the patient has consented to  Procedure(s) (LRB): CARDIOVERSION (N/A) as a surgical intervention .  The patients' history has been reviewed, patient examined, no change in status, stable for surgery.  I have reviewed the patients' chart and labs.  Questions were answered to the patient's satisfaction.     Pamella Pert

## 2012-01-11 NOTE — Anesthesia Preprocedure Evaluation (Addendum)
Anesthesia Evaluation  Patient identified by MRN, date of birth, ID band Patient awake    Reviewed: Allergy & Precautions, H&P , NPO status , Patient's Chart, lab work & pertinent test results, reviewed documented beta blocker date and time   History of Anesthesia Complications Negative for: history of anesthetic complications  Airway Mallampati: II TM Distance: >3 FB Neck ROM: Full    Dental  (+) Teeth Intact and Dental Advisory Given   Pulmonary COPDCurrent Smoker,          Cardiovascular hypertension, Pt. on medications and Pt. on home beta blockers + CAD, + Past MI and + CABG + dysrhythmias Atrial Fibrillation  Diastolic dysfunction EF>50%   Neuro/Psych PSYCHIATRIC DISORDERS Depression negative neurological ROS     GI/Hepatic negative GI ROS, Neg liver ROS,   Endo/Other  Diabetes mellitus-, Type 2  Renal/GU Partial left nephrectomy     Musculoskeletal  (+) Arthritis -, Osteoarthritis,    Abdominal (+) + obese,   Peds  Hematology Pradaxa longer than 3 weeks   Anesthesia Other Findings   Reproductive/Obstetrics                           Anesthesia Physical Anesthesia Plan  ASA: III  Anesthesia Plan: General   Post-op Pain Management:    Induction: Intravenous  Airway Management Planned: Mask and Natural Airway  Additional Equipment:   Intra-op Plan:   Post-operative Plan:   Informed Consent: I have reviewed the patients History and Physical, chart, labs and discussed the procedure including the risks, benefits and alternatives for the proposed anesthesia with the patient or authorized representative who has indicated his/her understanding and acceptance.   Dental advisory given  Plan Discussed with: CRNA and Anesthesiologist  Anesthesia Plan Comments:        Anesthesia Quick Evaluation

## 2012-01-11 NOTE — Anesthesia Postprocedure Evaluation (Signed)
  Anesthesia Post-op Note  Patient: [name]  Procedure(s) Performed: Procedure(s) (LRB): CARDIOVERSION (N/A)  Patient Location: Short Stay  Anesthesia Type: General  Level of Consciousness: oriented and sedated  Airway and Oxygen Therapy: Patient Spontanous Breathing and Patient connected to nasal cannula oxygen  Post-op Pain: none  Post-op Assessment: Post-op Vital signs reviewed, Patient's Cardiovascular Status Stable, Respiratory Function Stable, Patent Airway, No signs of Nausea or vomiting and Pain level controlled  Post-op Vital Signs: stable  Complications: No apparent anesthesia complications

## 2012-01-11 NOTE — H&P (Signed)
  Please see office visit notes for complete details of HPI.  

## 2012-01-11 NOTE — Progress Notes (Addendum)
1010 awake, alert and without complaints.  Hydrocortisone cream applied to chest.  Oxygen discontinued.  Family at bedside. 1045 Dr Nadara Eaton in to see patient.  Gave order to discharge patient 1100 cardioversion discharge and medication discharge instructions given.  Patient and family voice understanding.

## 2012-01-11 NOTE — Preoperative (Signed)
Beta Blockers   Reason not to administer Beta Blockers:Not Applicable 

## 2012-01-11 NOTE — CV Procedure (Signed)
Direct current cardioversion: Indication symptomatic A. Fibrillation. Procedure: Using 50 mg of IV Propofol for achieving deep (Moderate sedation), synchronized direct current cardioversion performed. Patient was delivered with 120 Joules of electricity with success to NSR. Patient tolerated the procedure well. No immediate complication noted.

## 2012-01-11 NOTE — Transfer of Care (Signed)
Immediate Anesthesia Transfer of Care Note  Patient: [name]  Procedure(s) Performed: Procedure(s) (LRB): CARDIOVERSION (N/A)  Patient Location: Short Stay  Anesthesia Type: General  Level of Consciousness: awake, alert , oriented and patient cooperative  Airway & Oxygen Therapy: Patient Spontanous Breathing and Patient connected to nasal cannula oxygen  Post-op Assessment: Report given to PACU RN and Post -op Vital signs reviewed and stable  Post vital signs: Reviewed and stable  Complications: No apparent anesthesia complications

## 2012-01-12 ENCOUNTER — Encounter (HOSPITAL_COMMUNITY): Payer: Self-pay | Admitting: Cardiology

## 2012-01-30 ENCOUNTER — Inpatient Hospital Stay (HOSPITAL_COMMUNITY)
Admission: EM | Admit: 2012-01-30 | Discharge: 2012-02-03 | DRG: 379 | Disposition: A | Discharge status: Home / Self Care | Payer: Medicare Other | Source: Ambulatory Visit | Attending: Family Medicine | Admitting: Family Medicine

## 2012-01-30 ENCOUNTER — Encounter (HOSPITAL_COMMUNITY): Payer: Self-pay | Admitting: Emergency Medicine

## 2012-01-30 DIAGNOSIS — K5731 Diverticulosis of large intestine without perforation or abscess with bleeding: Secondary | ICD-10-CM

## 2012-01-30 DIAGNOSIS — J4489 Other specified chronic obstructive pulmonary disease: Secondary | ICD-10-CM | POA: Diagnosis present

## 2012-01-30 DIAGNOSIS — Z8719 Personal history of other diseases of the digestive system: Secondary | ICD-10-CM

## 2012-01-30 DIAGNOSIS — E7800 Pure hypercholesterolemia, unspecified: Secondary | ICD-10-CM | POA: Diagnosis present

## 2012-01-30 DIAGNOSIS — F329 Major depressive disorder, single episode, unspecified: Secondary | ICD-10-CM | POA: Diagnosis present

## 2012-01-30 DIAGNOSIS — I252 Old myocardial infarction: Secondary | ICD-10-CM

## 2012-01-30 DIAGNOSIS — K5730 Diverticulosis of large intestine without perforation or abscess without bleeding: Secondary | ICD-10-CM | POA: Diagnosis present

## 2012-01-30 DIAGNOSIS — I482 Chronic atrial fibrillation, unspecified: Secondary | ICD-10-CM | POA: Diagnosis present

## 2012-01-30 DIAGNOSIS — J449 Chronic obstructive pulmonary disease, unspecified: Secondary | ICD-10-CM | POA: Diagnosis present

## 2012-01-30 DIAGNOSIS — R259 Unspecified abnormal involuntary movements: Secondary | ICD-10-CM | POA: Diagnosis not present

## 2012-01-30 DIAGNOSIS — K648 Other hemorrhoids: Secondary | ICD-10-CM | POA: Diagnosis present

## 2012-01-30 DIAGNOSIS — D649 Anemia, unspecified: Secondary | ICD-10-CM | POA: Diagnosis present

## 2012-01-30 DIAGNOSIS — I4891 Unspecified atrial fibrillation: Secondary | ICD-10-CM | POA: Diagnosis not present

## 2012-01-30 DIAGNOSIS — I25810 Atherosclerosis of coronary artery bypass graft(s) without angina pectoris: Secondary | ICD-10-CM

## 2012-01-30 DIAGNOSIS — F3289 Other specified depressive episodes: Secondary | ICD-10-CM | POA: Diagnosis present

## 2012-01-30 DIAGNOSIS — Z7982 Long term (current) use of aspirin: Secondary | ICD-10-CM

## 2012-01-30 DIAGNOSIS — K625 Hemorrhage of anus and rectum: Secondary | ICD-10-CM

## 2012-01-30 DIAGNOSIS — K922 Gastrointestinal hemorrhage, unspecified: Secondary | ICD-10-CM | POA: Diagnosis present

## 2012-01-30 DIAGNOSIS — K5790 Diverticulosis of intestine, part unspecified, without perforation or abscess without bleeding: Secondary | ICD-10-CM | POA: Diagnosis present

## 2012-01-30 DIAGNOSIS — I2510 Atherosclerotic heart disease of native coronary artery without angina pectoris: Secondary | ICD-10-CM | POA: Diagnosis present

## 2012-01-30 DIAGNOSIS — K644 Residual hemorrhoidal skin tags: Secondary | ICD-10-CM | POA: Diagnosis present

## 2012-01-30 DIAGNOSIS — I10 Essential (primary) hypertension: Secondary | ICD-10-CM | POA: Diagnosis present

## 2012-01-30 DIAGNOSIS — J438 Other emphysema: Secondary | ICD-10-CM

## 2012-01-30 DIAGNOSIS — Z79899 Other long term (current) drug therapy: Secondary | ICD-10-CM

## 2012-01-30 DIAGNOSIS — E119 Type 2 diabetes mellitus without complications: Secondary | ICD-10-CM | POA: Diagnosis present

## 2012-01-30 DIAGNOSIS — Z951 Presence of aortocoronary bypass graft: Secondary | ICD-10-CM

## 2012-01-30 LAB — CBC
Hemoglobin: 13.6 g/dL (ref 13.0–17.0)
MCH: 29.8 pg (ref 26.0–34.0)
MCV: 87.7 fL (ref 78.0–100.0)
RBC: 4.56 MIL/uL (ref 4.22–5.81)

## 2012-01-30 LAB — COMPREHENSIVE METABOLIC PANEL
ALT: 8 U/L (ref 0–53)
CO2: 24 mEq/L (ref 19–32)
Calcium: 9.2 mg/dL (ref 8.4–10.5)
Creatinine, Ser: 1.22 mg/dL (ref 0.50–1.35)
GFR calc Af Amer: 66 mL/min — ABNORMAL LOW (ref >90)
GFR calc non Af Amer: 57 mL/min — ABNORMAL LOW (ref >90)
Glucose, Bld: 122 mg/dL — ABNORMAL HIGH (ref 70–99)

## 2012-01-30 LAB — HEMOGLOBIN AND HEMATOCRIT, BLOOD: HCT: 38.9 % — ABNORMAL LOW (ref 39.0–52.0)

## 2012-01-30 LAB — TYPE AND SCREEN: ABO/RH(D): O POS

## 2012-01-30 MED ORDER — VITAMIN K1 10 MG/ML IJ SOLN
5.0000 mg | Freq: Once | INTRAMUSCULAR | Status: AC
  Administered 2012-01-31: 5 mg via SUBCUTANEOUS
  Filled 2012-01-30: qty 0.5

## 2012-01-30 MED ORDER — LISINOPRIL 2.5 MG PO TABS
2.5000 mg | ORAL_TABLET | Freq: Every day | ORAL | Status: DC
  Administered 2012-01-31 – 2012-02-03 (×3): 2.5 mg via ORAL
  Filled 2012-01-30 (×5): qty 1

## 2012-01-30 MED ORDER — ACETAMINOPHEN 325 MG PO TABS: 650.0000 mg | ORAL_TABLET | Freq: Four times a day (QID) | ORAL | Status: DC | PRN

## 2012-01-30 MED ORDER — ALBUTEROL SULFATE (5 MG/ML) 0.5% IN NEBU: 2.5000 mg | INHALATION_SOLUTION | RESPIRATORY_TRACT | Status: DC | PRN

## 2012-01-30 MED ORDER — ONDANSETRON HCL 4 MG PO TABS: 4.0000 mg | ORAL_TABLET | Freq: Four times a day (QID) | ORAL | Status: DC | PRN

## 2012-01-30 MED ORDER — NITROGLYCERIN 0.4 MG SL SUBL: 0.4000 mg | SUBLINGUAL_TABLET | SUBLINGUAL | Status: DC | PRN

## 2012-01-30 MED ORDER — HYDROCODONE-ACETAMINOPHEN 5-325 MG PO TABS
1.0000 | ORAL_TABLET | ORAL | Status: DC | PRN
  Administered 2012-02-01: 1 via ORAL
  Administered 2012-02-03: 2 via ORAL
  Filled 2012-01-30: qty 2
  Filled 2012-01-30: qty 1

## 2012-01-30 MED ORDER — METOPROLOL TARTRATE 25 MG PO TABS
25.0000 mg | ORAL_TABLET | Freq: Two times a day (BID) | ORAL | Status: DC
  Administered 2012-01-31 – 2012-02-03 (×7): 25 mg via ORAL
  Filled 2012-01-30 (×9): qty 1

## 2012-01-30 MED ORDER — DILTIAZEM HCL ER 120 MG PO CP24
120.0000 mg | ORAL_CAPSULE | Freq: Every day | ORAL | Status: DC
  Administered 2012-01-31 – 2012-02-02 (×4): 120 mg via ORAL
  Filled 2012-01-30 (×5): qty 1

## 2012-01-30 MED ORDER — PANTOPRAZOLE SODIUM 40 MG IV SOLR
40.0000 mg | Freq: Every day | INTRAVENOUS | Status: DC
  Administered 2012-02-01: 40 mg via INTRAVENOUS
  Filled 2012-01-30 (×2): qty 40

## 2012-01-30 MED ORDER — IPRATROPIUM BROMIDE 0.02 % IN SOLN: 0.5000 mg | RESPIRATORY_TRACT | Status: DC | PRN

## 2012-01-30 MED ORDER — ONDANSETRON HCL 4 MG/2ML IJ SOLN: 4.0000 mg | Freq: Four times a day (QID) | INTRAMUSCULAR | Status: DC | PRN

## 2012-01-30 MED ORDER — ACETAMINOPHEN 650 MG RE SUPP: 650.0000 mg | Freq: Four times a day (QID) | RECTAL | Status: DC | PRN

## 2012-01-30 MED ORDER — DIGOXIN 125 MCG PO TABS
125.0000 ug | ORAL_TABLET | Freq: Every morning | ORAL | Status: DC
  Administered 2012-01-31 – 2012-02-03 (×3): 125 ug via ORAL
  Filled 2012-01-30 (×4): qty 1

## 2012-01-30 MED ORDER — POTASSIUM CHLORIDE IN NACL 20-0.9 MEQ/L-% IV SOLN
INTRAVENOUS | Status: DC
  Administered 2012-01-31 – 2012-02-03 (×6): via INTRAVENOUS
  Filled 2012-01-30 (×9): qty 1000

## 2012-01-30 NOTE — H&P (Signed)
PCP:   Benita Stabile, MD, MD   Chief Complaint:  Lower GI bleed  HPI: This is a 71 year old gentleman with known history of diverticulosis, status post colectomy 2000. He states he's not had any GI bleed since. His last colonoscopy was in 2007. This was normal no evidence of diverticulosis. Patient states he woke up this morning noticed some blood in the stool. He went to church, came home and again had bright red and dark brown blood with bowel movements. This again repeated later in the day, they came to the ER. The patient denies any lightheaded or dizziness. Patient denies any abdominal pain, nausea or vomiting or diarrhea. He denies any fevers or chills. The patient does have a history of coronary artery disease with recent CABG. He denies any stent placement. The patient is on anticoagulants Pradaxa and aspirin. He sees [place] cardiology. History provided by the patient is alert and oriented family members at bedside.   Review of Systems: positives bolded   anorexia, fever, weight loss,, vision loss, decreased hearing, hoarseness, chest pain, syncope, dyspnea on exertion, peripheral edema, balance deficits, hemoptysis, abdominal pain, melena,  bright red blood per rectum, hematochezia, severe indigestion/heartburn, hematuria, incontinence, genital sores, muscle weakness, suspicious skin lesions, transient blindness, difficulty walking, depression, unusual weight change, abnormal bleeding, enlarged lymph nodes, angioedema, and breast masses.  Past Medical History: Past Medical History  Diagnosis Date  . CAD (coronary artery disease)   . Acute non-ST segment elevation myocardial infarction   . Left renal mass   . Diverticulitis   . COPD (chronic obstructive pulmonary disease)   . HTN (hypertension)   . Myocardial infarction 06/19/2011  . Ulcer 1960'S  . Renal neoplasm     LEFT  . Blood transfusion   . Diverticulitis 1996  . Depression    Past Surgical History  Procedure Date    . Coronary artery bypass grafting x4 06/28/11    Dr Tyrone Sage  . Hernia repair surgery x4.   . Coronary artery bypass graft 06/2011    4 VESSELS  . Colon resection 2005    FOR DIVERTICULITIS  . Robot assisted laparoscopic nephrectomy 11/29/2011    Procedure: ROBOTIC ASSISTED LAPAROSCOPIC NEPHRECTOMY;  Surgeon: Crecencio Mc, MD;  Location: [facility];  Service: Urology;  Laterality: Left;  PARTIAL left nephrectomy    . Cardioversion 01/11/2012    Procedure: CARDIOVERSION;  Surgeon: Pamella Pert, MD;  Location: [facility] OR;  Service: Cardiovascular;  Laterality: N/A;    Medications: Prior to Admission medications   Medication Sig Start Date End Date Taking? Authorizing Provider  aspirin 81 MG EC tablet Take 81 mg by mouth daily after breakfast. 10/29/11 10/28/12 Yes Pamella Pert, MD  dabigatran (PRADAXA) 150 MG CAPS Take 150 mg by mouth every 12 (twelve) hours.   Yes Historical Provider, MD  digoxin (LANOXIN) 0.125 MG tablet Take 125 mcg by mouth every morning.   Yes Historical Provider, MD  diltiazem (DILACOR XR) 120 MG 24 hr capsule Take 120 mg by mouth at bedtime. 10/29/11 10/28/12 Yes Pamella Pert, MD  fenofibrate (TRICOR) 145 MG tablet Take 145 mg by mouth daily after breakfast.    Yes Historical Provider, MD  furosemide (LASIX) 20 MG tablet Take 20 mg by mouth daily as needed. For fluid retention    Yes Historical Provider, MD  lisinopril (PRINIVIL,ZESTRIL) 2.5 MG tablet Take 2.5 mg by mouth daily after breakfast.    Yes Historical Provider, MD  metoprolol (LOPRESSOR) 50 MG tablet Take 25 mg  by mouth 2 (two) times daily.    Yes Historical Provider, MD  nitroGLYCERIN (NITROSTAT) 0.4 MG SL tablet Place 0.4 mg under the tongue every 5 (five) minutes as needed. For chest pain   Yes Historical Provider, MD  omega-3 acid ethyl esters (LOVAZA) 1 G capsule Take 2 g by mouth 2 (two) times daily.    Yes Historical Provider, MD  polyvinyl alcohol (LIQUIFILM TEARS) 1.4 % ophthalmic solution Place  1 drop into both eyes 2 (two) times daily as needed. Dry eyes   Yes Historical Provider, MD    Allergies:  No Known Allergies  Social History:  reports that he quit smoking about 7 months ago. His smoking use included Cigarettes. He has never used smokeless tobacco. He reports that he does not drink alcohol or use illicit drugs.  Family History: Family History  Problem Relation Age of Onset  . Heart disease Father   . Alzheimer's disease Mother   . Alcohol abuse Brother     Physical Exam: Filed Vitals:   01/30/12 1658 01/30/12 1946  BP: 127/64 123/72  Pulse: 90 88  Temp: 98.1 F (36.7 C) 97 F (36.1 C)  TempSrc: Oral Oral  Resp: 19 16  SpO2: 96% 95%    General:  Alert and oriented times three, well developed and nourished, no acute distress Eyes: PERRLA, pink conjunctiva, no scleral icterus ENT: Moist oral mucosa, neck supple, no thyromegaly Lungs: clear to ascultation, no wheeze, no crackles, no use of accessory muscles Cardiovascular: regular rate and rhythm, no regurgitation, no gallops, no murmurs. No carotid bruits, no JVD Abdomen: soft, positive BS, non-tender, non-distended, no organomegaly, not an acute abdomen GU: not examined Neuro: CN II - XII grossly intact, sensation intact Musculoskeletal: strength 5/5 all extremities, no clubbing, cyanosis or edema Skin: no rash, no subcutaneous crepitation, no decubitus Psych: appropriate patient   Labs on Admission:   Basename 01/30/12 1752  NA 138  K 3.8  CL 104  CO2 24  GLUCOSE 122*  BUN 21  CREATININE 1.22  CALCIUM 9.2  MG --  PHOS --    Basename 01/30/12 1752  AST 12  ALT 8  ALKPHOS 30*  BILITOT 0.3  PROT 7.0  ALBUMIN 3.5   No results found for this basename: LIPASE:2,AMYLASE:2 in the last 72 hours  Basename 01/30/12 1752  WBC 6.9  NEUTROABS --  HGB 13.6  HCT 40.0  MCV 87.7  PLT 146*   No results found for this basename: CKTOTAL:3,CKMB:3,CKMBINDEX:3,TROPONINI:3 in the last 72 hours No  components found with this basename: POCBNP:3 No results found for this basename: DDIMER:2 in the last 72 hours No results found for this basename: HGBA1C:2 in the last 72 hours No results found for this basename: CHOL:2,HDL:2,LDLCALC:2,TRIG:2,CHOLHDL:2,LDLDIRECT:2 in the last 72 hours No results found for this basename: TSH,T4TOTAL,FREET3,T3FREE,THYROIDAB in the last 72 hours No results found for this basename: VITAMINB12:2,FOLATE:2,FERRITIN:2,TIBC:2,IRON:2,RETICCTPCT:2 in the last 72 hours  Micro Results: No results found for this or any previous visit (from the past 240 hour(s)).   Radiological Exams on Admission: No results found.  Assessment/Plan Present on Admission:  .GI bleed History of diverticulosis status post colectomy Admit to MedSurg Serial H&H, type and screen ordered N.p.o., GI Dr. Bosie Clos consulted  Discontinue blood thinners Pradaxa and aspirin IV fluid hydration  .CAD (coronary artery disease) .Essential hypertension, benign .Pure hypercholesterolemia  stable resume home medication except for blood thinners   Full code No DVT prophylaxis T9/Dr. Wilber Oliphant, Tommy Minichiello 01/30/2012, 9:33 PM

## 2012-01-30 NOTE — ED Provider Notes (Signed)
Complains of painless rectal bleeding onset this morning no lightheadedness no other complaint no abdominal pain no treatment prior to coming here exam alert pleasant nontoxic Hemodynamically stable. Assessment: in light of blood thinners i.e. Pradaxa and aspirin and multiple comorbidities., Suggest admission and GI consultation  Doug Sou, MD 01/30/12 1836

## 2012-01-30 NOTE — ED Provider Notes (Signed)
8:00 p.m.: Patient to CDU, comfortable. History confirmed: painless rectal bleeding today, bright red blood. History of diverticulitis with partial colectomy in 2000. Last colonoscopy 2007 - [facility]. No pain, fever, vomiting today. On Pradaxa for CAD, taking ASA - INR today elevated 1.6. Plan: admit for rectal bleeding and coagulopathy.  8:55 - Discussed with Dr. Haroldine Laws ([facility]) who will see for admission. GI (Dr. Ewing Schlein) paged for consult as requested by Dr. Haroldine Laws.  Rodena Medin, PA-C 01/30/12 2056

## 2012-01-30 NOTE — ED Notes (Signed)
Admitting MD at bedside.

## 2012-01-30 NOTE — ED Notes (Signed)
Assisted pt to bathroom

## 2012-01-30 NOTE — ED Notes (Signed)
Pt reports bright red and dark colored rectal bleeding since this morning.  Taking blood thinners.

## 2012-01-30 NOTE — ED Notes (Signed)
Pt back in bed....rails up call light w/in reach

## 2012-01-30 NOTE — ED Provider Notes (Signed)
History     CSN: [id number]  Arrival date & time 01/30/12  1650   First MD Initiated Contact with Patient 01/30/12 1740      Chief Complaint  Patient presents with  . Rectal Bleeding    (Consider location/radiation/quality/duration/timing/severity/associated sxs/prior treatment) HPI Comments: Has noticed bright red blood in multiple stools today.  On dabigatran for prior cardiac bypass.  No rectal or abdominal pain.  No lightheadedness or weakness.  Otherwise doing well.  Patient is a 71 y.o. male presenting with hematochezia. The history is provided by the patient.  Rectal Bleeding  The current episode started today. The problem occurs continuously. The problem has been unchanged. The patient is experiencing no pain. The stool is described as bloody and soft. There was no prior successful therapy. There was no prior unsuccessful therapy. Pertinent negatives include no fever, no abdominal pain, no diarrhea, no chest pain, no headaches, no difficulty breathing and no rash. He has been behaving normally. He has been eating and drinking normally.    Past Medical History  Diagnosis Date  . CAD (coronary artery disease)   . Acute non-ST segment elevation myocardial infarction   . Left renal mass   . Diverticulitis   . COPD (chronic obstructive pulmonary disease)   . HTN (hypertension)   . Myocardial infarction 06/19/2011  . Ulcer 1960'S  . Renal neoplasm     LEFT  . Blood transfusion   . Diverticulitis 1996  . DM2 (diabetes mellitus, type 2)   . Depression     Past Surgical History  Procedure Date  . Coronary artery bypass grafting x4 06/28/11    Dr Tyrone Sage  . Hernia repair surgery x4.   . Coronary artery bypass graft 06/2011    4 VESSELS  . Colon resection 2005    FOR DIVERTICULITIS  . Robot assisted laparoscopic nephrectomy 11/29/2011    Procedure: ROBOTIC ASSISTED LAPAROSCOPIC NEPHRECTOMY;  Surgeon: Crecencio Mc, MD;  Location: [facility];  Service: Urology;  Laterality:  Left;  PARTIAL left nephrectomy    . Cardioversion 01/11/2012    Procedure: CARDIOVERSION;  Surgeon: Pamella Pert, MD;  Location: [facility] OR;  Service: Cardiovascular;  Laterality: N/A;    Family History  Problem Relation Age of Onset  . Heart disease Father   . Alzheimer's disease Mother   . Alcohol abuse Brother     History  Substance Use Topics  . Smoking status: Former Smoker    Types: Cigarettes    Quit date: 06/19/2011  . Smokeless tobacco: Never Used  . Alcohol Use: No      Review of Systems  Constitutional: Negative for fever, activity change and fatigue.  HENT: Negative for congestion.   Eyes: Negative for pain.  Respiratory: Negative for chest tightness, shortness of breath, wheezing and stridor.   Cardiovascular: Negative for chest pain and leg swelling.  Gastrointestinal: Positive for hematochezia. Negative for abdominal pain and diarrhea.  Genitourinary: Negative for dysuria.  Musculoskeletal: Negative for arthralgias.  Skin: Negative for rash.  Neurological: Negative for headaches.  Psychiatric/Behavioral: Negative for behavioral problems.    Allergies  Review of patient's allergies indicates no known allergies.  Home Medications   Current Outpatient Rx  Name Route Sig Dispense Refill  . ASPIRIN 81 MG PO TBEC Oral Take 81 mg by mouth daily after breakfast.    . DABIGATRAN ETEXILATE MESYLATE 150 MG PO CAPS Oral Take 150 mg by mouth every 12 (twelve) hours.    Marland Kitchen DIGOXIN 0.125 MG PO TABS Oral  Take 125 mcg by mouth every morning.    Marland Kitchen DILTIAZEM HCL ER 120 MG PO CP24 Oral Take 120 mg by mouth at bedtime.    . FENOFIBRATE 145 MG PO TABS Oral Take 145 mg by mouth daily after breakfast.     . FUROSEMIDE 20 MG PO TABS Oral Take 20 mg by mouth daily as needed. For fluid retention     . LISINOPRIL 2.5 MG PO TABS Oral Take 2.5 mg by mouth daily after breakfast.     . METOPROLOL TARTRATE 50 MG PO TABS Oral Take 25 mg by mouth 2 (two) times daily.     Marland Kitchen  NITROGLYCERIN 0.4 MG SL SUBL Sublingual Place 0.4 mg under the tongue every 5 (five) minutes as needed. For chest pain    . OMEGA-3-ACID ETHYL ESTERS 1 G PO CAPS Oral Take 2 g by mouth 2 (two) times daily.     Marland Kitchen POLYVINYL ALCOHOL 1.4 % OP SOLN Both Eyes Place 1 drop into both eyes 2 (two) times daily as needed. Dry eyes      BP 127/64  Pulse 90  Temp(Src) 98.1 F (36.7 C) (Oral)  Resp 19  SpO2 96%  Physical Exam  Constitutional: He is oriented to person, place, and time. He appears well-developed and well-nourished. No distress.  HENT:  Head: Normocephalic and atraumatic.  Eyes: Conjunctivae and EOM are normal. Pupils are equal, round, and reactive to light. No scleral icterus.  Neck: Normal range of motion. Neck supple.  Cardiovascular: Normal rate and regular rhythm.  Exam reveals no gallop and no friction rub.   No murmur heard. Pulmonary/Chest: Effort normal and breath sounds normal. No respiratory distress. He has no wheezes. He has no rales. He exhibits no tenderness.  Abdominal: Soft. He exhibits no distension and no mass. There is no tenderness. There is no rebound and no guarding.  Genitourinary:       Heme positive stool.  No hemmorrhoid.  Musculoskeletal: Normal range of motion. He exhibits no edema and no tenderness.  Neurological: He is alert and oriented to person, place, and time. He has normal reflexes. No cranial nerve deficit. He exhibits normal muscle tone. Coordination normal.  Skin: Skin is warm and dry. No rash noted. He is not diaphoretic. No erythema.  Psychiatric: He has a normal mood and affect. His behavior is normal. Judgment and thought content normal.    ED Course  Procedures (including critical care time)  Labs Reviewed  CBC - Abnormal; Notable for the following:    RDW 16.1 (*)    Platelets 146 (*)    All other components within normal limits  COMPREHENSIVE METABOLIC PANEL - Abnormal; Notable for the following:    Glucose, Bld 122 (*)     Alkaline Phosphatase 30 (*)    GFR calc non Af Amer 57 (*)    GFR calc Af Amer 66 (*)    All other components within normal limits  PROTIME-INR - Abnormal; Notable for the following:    Prothrombin Time 19.7 (*)    INR 1.64 (*)    All other components within normal limits  TYPE AND SCREEN  OCCULT BLOOD, POC DEVICE   No results found.   1. Rectal bleed       MDM  Has noticed bright red blood in multiple stools today.  On dabigatran for prior cardiac bypass.  No rectal or abdominal pain.  No lightheadedness or weakness.  Otherwise doing well.  Heme positive bright red blood per  rectum on exam.  Given cardiac disease and anticoagulant use will check labs and plan to admit for monitoring.  Care transferred to CDU while awaiting admission.     I have personally seen and examined the patient.  I have discussed the plan of care with the resident.  I have reviewed the documentation on PMH/FH/Soc. History.  I have reviewed the documentation of the resident and agree.   Army [place], MD 01/30/12 1911  Doug Sou, MD 01/31/12 0111

## 2012-01-31 ENCOUNTER — Encounter (HOSPITAL_COMMUNITY): Payer: Self-pay | Admitting: Gastroenterology

## 2012-01-31 DIAGNOSIS — K5731 Diverticulosis of large intestine without perforation or abscess with bleeding: Secondary | ICD-10-CM

## 2012-01-31 DIAGNOSIS — J438 Other emphysema: Secondary | ICD-10-CM

## 2012-01-31 DIAGNOSIS — I25810 Atherosclerosis of coronary artery bypass graft(s) without angina pectoris: Secondary | ICD-10-CM

## 2012-01-31 LAB — BASIC METABOLIC PANEL
BUN: 20 mg/dL (ref 6–23)
CO2: 23 mEq/L (ref 19–32)
Chloride: 106 mEq/L (ref 96–112)
GFR calc non Af Amer: 63 mL/min — ABNORMAL LOW (ref >90)
Glucose, Bld: 118 mg/dL — ABNORMAL HIGH (ref 70–99)
Potassium: 4 mEq/L (ref 3.5–5.1)

## 2012-01-31 LAB — CBC
HCT: 38 % — ABNORMAL LOW (ref 39.0–52.0)
Hemoglobin: 12.7 g/dL — ABNORMAL LOW (ref 13.0–17.0)
MCH: 29.5 pg (ref 26.0–34.0)
MCHC: 33.4 g/dL (ref 30.0–36.0)
MCV: 88.2 fL (ref 78.0–100.0)
Platelets: 146 10*3/uL — ABNORMAL LOW (ref 150–400)

## 2012-01-31 LAB — PROTIME-INR
INR: 1.64 — ABNORMAL HIGH (ref 0.00–1.49)
Prothrombin Time: 19.7 seconds — ABNORMAL HIGH (ref 11.6–15.2)

## 2012-01-31 LAB — APTT: aPTT: 59 seconds — ABNORMAL HIGH (ref 24–37)

## 2012-01-31 LAB — HEMOGLOBIN AND HEMATOCRIT, BLOOD: Hemoglobin: 12 g/dL — ABNORMAL LOW (ref 13.0–17.0)

## 2012-01-31 MED ORDER — CHLORHEXIDINE GLUCONATE 0.12 % MT SOLN: 15.0000 mL | Freq: Two times a day (BID) | OROMUCOSAL | Status: DC

## 2012-01-31 MED ORDER — BIOTENE DRY MOUTH MT LIQD: 15.0000 mL | Freq: Two times a day (BID) | OROMUCOSAL | Status: DC

## 2012-01-31 NOTE — ED Provider Notes (Signed)
Medical screening examination/treatment/procedure(s) were conducted as a shared visit with non-physician practitioner(s) and myself.  I personally evaluated the patient during the encounter  Doug Sou, MD 01/31/12 [phone]

## 2012-01-31 NOTE — Progress Notes (Signed)
Subjective: Patient sleeping, wife at bedside Still having some bleeding but reports to be less   Objective: Vital signs in last 24 hours: Filed Vitals:   01/30/12 2155 01/30/12 2300 01/30/12 2317 01/31/12 0538  BP: 150/85 152/77  128/64  Pulse: 88 76  82  Temp: 97 F (36.1 C) 98.1 F (36.7 C)  98.1 F (36.7 C)  TempSrc: Oral Oral    Resp: 16 18  18   Height:   6' (1.829 m)   Weight:  90.447 kg (199 lb 6.4 oz)    SpO2: 97% 98%  99%   Weight change:   Intake/Output Summary (Last 24 hours) at 01/31/12 1313 Last data filed at 01/31/12 0643  Gross per 24 hour  Intake    403 ml  Output      0 ml  Net    403 ml    Physical Exam: General: Awake, Oriented, No acute distress. HEENT: EOMI. Neck: Supple CV: S1 and S2 Lungs: Clear to ascultation bilaterally Abdomen: Soft, Nontender, Nondistended, +bowel sounds. Ext: Good pulses. Trace edema.   Lab Results:  [facility] 01/31/12 0511 01/30/12 1752  NA 140 138  K 4.0 3.8  CL 106 104  CO2 23 24  GLUCOSE 118* 122*  BUN 20 21  CREATININE 1.12 1.22  CALCIUM 8.7 9.2  MG -- --  PHOS -- --    Basename 01/30/12 1752  AST 12  ALT 8  ALKPHOS 30*  BILITOT 0.3  PROT 7.0  ALBUMIN 3.5   No results found for this basename: LIPASE:2,AMYLASE:2 in the last 72 hours  Basename 01/31/12 1028 01/31/12 0511 01/30/12 1752  WBC -- 7.4 6.9  NEUTROABS -- -- --  HGB 12.6* 12.7* --  HCT 38.0* 38.0* --  MCV -- 88.2 87.7  PLT -- 146* 146*   No results found for this basename: CKTOTAL:3,CKMB:3,CKMBINDEX:3,TROPONINI:3 in the last 72 hours No components found with this basename: POCBNP:3 No results found for this basename: DDIMER:2 in the last 72 hours No results found for this basename: HGBA1C:2 in the last 72 hours No results found for this basename: CHOL:2,HDL:2,LDLCALC:2,TRIG:2,CHOLHDL:2,LDLDIRECT:2 in the last 72 hours No results found for this basename: TSH,T4TOTAL,FREET3,T3FREE,THYROIDAB in the last 72 hours No results found for  this basename: VITAMINB12:2,FOLATE:2,FERRITIN:2,TIBC:2,IRON:2,RETICCTPCT:2 in the last 72 hours  Micro Results: No results found for this or any previous visit (from the past 240 hour(s)).  Studies/Results: No results found.  Medications: I have reviewed the patient's current medications. Scheduled Meds:   . digoxin  125 mcg Oral q morning - 10a  . diltiazem  120 mg Oral QHS  . lisinopril  2.5 mg Oral QPC breakfast  . metoprolol  25 mg Oral BID  . pantoprazole (PROTONIX) IV  40 mg Intravenous QHS  . phytonadione  5 mg Subcutaneous Once  . DISCONTD: antiseptic oral rinse  15 mL Mouth Rinse q12n4p  . DISCONTD: chlorhexidine  15 mL Mouth Rinse BID   Continuous Infusions:   . 0.9 % NaCl with KCl 20 mEq / L 75 mL/hr at 01/31/12 0120   PRN Meds:.acetaminophen, acetaminophen, albuterol, HYDROcodone-acetaminophen, ipratropium, nitroGLYCERIN, ondansetron (ZOFRAN) IV, ondansetron  Assessment/Plan:   *GI bleed- on blood thinner, held.  prob diverticular- no pain- seen by GI, clear liquids, bleeding scan vs colonscopy Trend H/H   CAD (coronary artery disease)- no CP   Essential hypertension, benign- stable   Pure hypercholesterolemia- stable   Diverticulosis- see above   COPD (chronic obstructive pulmonary disease)- stable     LOS: 1 day  Arienne Gartin, DO 01/31/2012, 1:13 PM

## 2012-01-31 NOTE — Consult Note (Signed)
Reason for Consult: Lower GI bleeding Referring Physician: Hospital team  [name] is an 71 y.o. male.  HPI: Patient known to me from multiple colonoscopies and status post right hemicolectomy for diverticular problems years ago who has had multiple medical problems recently including open-heart surgery and a kidney removed however has not had any GI problems until he started passing bright red blood without clots yesterday and when it seemed to continue he presented to the emergency room and because he is on blood thinners was admitted for observation and further workup and plans. His last colonoscopy was in 2007 and showed diverticuli only. His family history is negative for any GI problems and he has no other GI complaints Past Medical History  Diagnosis Date  . CAD (coronary artery disease)   . Acute non-ST segment elevation myocardial infarction   . Left renal mass   . Diverticulitis   . COPD (chronic obstructive pulmonary disease)   . HTN (hypertension)   . Myocardial infarction 06/19/2011  . Ulcer 1960'S  . Renal neoplasm     LEFT  . Blood transfusion   . Diverticulitis 1996  . Depression     Past Surgical History  Procedure Date  . Coronary artery bypass grafting x4 06/28/11    Dr Tyrone Sage  . Hernia repair surgery x4.   . Coronary artery bypass graft 06/2011    4 VESSELS  . Colon resection 2005    FOR DIVERTICULITIS  . Robot assisted laparoscopic nephrectomy 11/29/2011    Procedure: ROBOTIC ASSISTED LAPAROSCOPIC NEPHRECTOMY;  Surgeon: Crecencio Mc, MD;  Location: [facility] ORS;  Service: Urology;  Laterality: Left;  PARTIAL left nephrectomy    . Cardioversion 01/11/2012    Procedure: CARDIOVERSION;  Surgeon: Pamella Pert, MD;  Location: [facility] OR;  Service: Cardiovascular;  Laterality: N/A;    Family History  Problem Relation Age of Onset  . Heart disease Father   . Alzheimer's disease Mother   . Alcohol abuse Brother     Social History:  reports that he quit smoking  about 7 months ago. His smoking use included Cigarettes. He has never used smokeless tobacco. He reports that he does not drink alcohol or use illicit drugs.  Allergies: No Known Allergies  Medications: I have reviewed the patient's current medications.  Results for orders placed during the hospital encounter of 01/30/12 (from the past 48 hour(s))  OCCULT BLOOD, POC DEVICE     Status: Normal   Collection Time   01/30/12  5:49 PM      Component Value Range Comment   Fecal Occult Bld POSITIVE     CBC     Status: Abnormal   Collection Time   01/30/12  5:52 PM      Component Value Range Comment   WBC 6.9  4.0 - 10.5 (K/uL)    RBC 4.56  4.22 - 5.81 (MIL/uL)    Hemoglobin 13.6  13.0 - 17.0 (g/dL)    HCT 45.4  09.8 - 11.9 (%)    MCV 87.7  78.0 - 100.0 (fL)    MCH 29.8  26.0 - 34.0 (pg)    MCHC 34.0  30.0 - 36.0 (g/dL)    RDW 14.7 (*) 82.9 - 15.5 (%)    Platelets 146 (*) 150 - 400 (K/uL)   COMPREHENSIVE METABOLIC PANEL     Status: Abnormal   Collection Time   01/30/12  5:52 PM      Component Value Range Comment   Sodium 138  135 -  145 (mEq/L)    Potassium 3.8  3.5 - 5.1 (mEq/L)    Chloride 104  96 - 112 (mEq/L)    CO2 24  19 - 32 (mEq/L)    Glucose, Bld 122 (*) 70 - 99 (mg/dL)    BUN 21  6 - 23 (mg/dL)    Creatinine, Ser 9.14  0.50 - 1.35 (mg/dL)    Calcium 9.2  8.4 - 10.5 (mg/dL)    Total Protein 7.0  6.0 - 8.3 (g/dL)    Albumin 3.5  3.5 - 5.2 (g/dL)    AST 12  0 - 37 (U/L)    ALT 8  0 - 53 (U/L)    Alkaline Phosphatase 30 (*) 39 - 117 (U/L)    Total Bilirubin 0.3  0.3 - 1.2 (mg/dL)    GFR calc non Af Amer 57 (*) >90 (mL/min)    GFR calc Af Amer 66 (*) >90 (mL/min)   PROTIME-INR     Status: Abnormal   Collection Time   01/30/12  5:52 PM      Component Value Range Comment   Prothrombin Time 19.7 (*) 11.6 - 15.2 (seconds)    INR 1.64 (*) 0.00 - 1.49    TYPE AND SCREEN     Status: Normal   Collection Time   01/30/12  6:03 PM      Component Value Range Comment   ABO/RH(D) O POS       Antibody Screen NEG      Sample Expiration 02/02/2012     HEMOGLOBIN AND HEMATOCRIT, BLOOD     Status: Abnormal   Collection Time   01/30/12 11:17 PM      Component Value Range Comment   Hemoglobin 13.2  13.0 - 17.0 (g/dL)    HCT 78.2 (*) 95.6 - 52.0 (%)   APTT     Status: Abnormal   Collection Time   01/31/12 12:40 AM      Component Value Range Comment   aPTT 59 (*) 24 - 37 (seconds)   BASIC METABOLIC PANEL     Status: Abnormal   Collection Time   01/31/12  5:11 AM      Component Value Range Comment   Sodium 140  135 - 145 (mEq/L)    Potassium 4.0  3.5 - 5.1 (mEq/L)    Chloride 106  96 - 112 (mEq/L)    CO2 23  19 - 32 (mEq/L)    Glucose, Bld 118 (*) 70 - 99 (mg/dL)    BUN 20  6 - 23 (mg/dL)    Creatinine, Ser 2.13  0.50 - 1.35 (mg/dL)    Calcium 8.7  8.4 - 10.5 (mg/dL)    GFR calc non Af Amer 63 (*) >90 (mL/min)    GFR calc Af Amer 73 (*) >90 (mL/min)   CBC     Status: Abnormal   Collection Time   01/31/12  5:11 AM      Component Value Range Comment   WBC 7.4  4.0 - 10.5 (K/uL)    RBC 4.31  4.22 - 5.81 (MIL/uL)    Hemoglobin 12.7 (*) 13.0 - 17.0 (g/dL)    HCT 08.6 (*) 57.8 - 52.0 (%)    MCV 88.2  78.0 - 100.0 (fL)    MCH 29.5  26.0 - 34.0 (pg)    MCHC 33.4  30.0 - 36.0 (g/dL)    RDW 46.9 (*) 62.9 - 15.5 (%)    Platelets 146 (*) 150 - 400 (K/uL)  PROTIME-INR     Status: Abnormal   Collection Time   01/31/12  5:11 AM      Component Value Range Comment   Prothrombin Time 19.7 (*) 11.6 - 15.2 (seconds)    INR 1.64 (*) 0.00 - 1.49      No results found.  ROS negative except above Blood pressure 128/64, pulse 82, temperature 98.1 F (36.7 C), temperature source Oral, resp. rate 18, height 6' (1.829 m), weight 90.447 kg (199 lb 6.4 oz), SpO2 99.00%. Physical Exam vital signs stable afebrile no acute distress abdomen is soft nontender labs reviewed previous colonoscopy reviewed  Assessment/Plan: Probable diverticular bleeding in a patient on blood thinners currently  being held Plan: Will allow clear liquids and consider bleeding scan if it continued versus colonoscopy otherwise if it stops on its own can probably have an elective colonoscopy as an outpatient since it's been over 5 years and he will need to discuss with cardiology which blood thinners he truly needs and will follow with you and call sooner if signs of increased bleeding or other questions or problems Cage Gupton E 01/31/2012, 10:09 AM

## 2012-02-01 DIAGNOSIS — I25810 Atherosclerosis of coronary artery bypass graft(s) without angina pectoris: Secondary | ICD-10-CM

## 2012-02-01 DIAGNOSIS — J438 Other emphysema: Secondary | ICD-10-CM

## 2012-02-01 DIAGNOSIS — K5731 Diverticulosis of large intestine without perforation or abscess with bleeding: Secondary | ICD-10-CM

## 2012-02-01 LAB — BASIC METABOLIC PANEL
BUN: 13 mg/dL (ref 6–23)
Calcium: 8.6 mg/dL (ref 8.4–10.5)
Creatinine, Ser: 0.97 mg/dL (ref 0.50–1.35)
GFR calc non Af Amer: 80 mL/min — ABNORMAL LOW (ref >90)
Glucose, Bld: 125 mg/dL — ABNORMAL HIGH (ref 70–99)
Potassium: 3.7 mEq/L (ref 3.5–5.1)

## 2012-02-01 LAB — CBC
HCT: 33.8 % — ABNORMAL LOW (ref 39.0–52.0)
Hemoglobin: 11.4 g/dL — ABNORMAL LOW (ref 13.0–17.0)
MCH: 29.2 pg (ref 26.0–34.0)
MCHC: 33.7 g/dL (ref 30.0–36.0)
MCV: 86.4 fL (ref 78.0–100.0)

## 2012-02-01 MED ORDER — PANTOPRAZOLE SODIUM 40 MG PO TBEC
40.0000 mg | DELAYED_RELEASE_TABLET | Freq: Every day | ORAL | Status: DC
  Administered 2012-02-01 – 2012-02-02 (×2): 40 mg via ORAL
  Filled 2012-02-01 (×2): qty 1

## 2012-02-01 MED ORDER — PEG 3350-KCL-NA BICARB-NACL 420 G PO SOLR
4000.0000 mL | Freq: Once | ORAL | Status: AC
  Administered 2012-02-01: 4000 mL via ORAL
  Filled 2012-02-01: qty 4000

## 2012-02-01 NOTE — Progress Notes (Signed)
[name] 11:29 AM  Subjective: Patient is seeing less blood but still passing some a few times a day and no new complaints  Objective: Vital signs stable afebrile no acute distress abdomen is soft nontender hemoglobin slightly decreased  Assessment: Probable diverticular bleeding  Plan: Will proceed with colonoscopy tomorrow at 8:30 AM please call sooner if questions or problem and the above was discussed with the patient and his family and they agree  [facility] E

## 2012-02-01 NOTE — Progress Notes (Signed)
Subjective: Had episodes of BRB last PM No CP, no SOB No abdominal pain   Objective: Vital signs in last 24 hours: Filed Vitals:   01/31/12 1339 01/31/12 2124 02/01/12 0028 02/01/12 0527  BP: 115/80 140/78 129/79 121/66  Pulse: 83 82 109 100  Temp: 97.7 F (36.5 C) 98.6 F (37 C)  98.5 F (36.9 C)  TempSrc:      Resp: 16 18  16   Height:      Weight:      SpO2: 97% 98%  100%   Weight change:   Intake/Output Summary (Last 24 hours) at 02/01/12 0830 Last data filed at 02/01/12 0631  Gross per 24 hour  Intake   2086 ml  Output      0 ml  Net   2086 ml    Physical Exam: General: Awake, Oriented, No acute distress. HEENT: EOMI. Neck: Supple CV: S1 and S2, rrr Lungs: Clear to ascultation bilaterally, no wheezing Abdomen: Soft, Nontender, Nondistended, +bowel sounds. Ext: Good pulses. Trace edema.   Lab Results:  [facility] 02/01/12 0626 01/31/12 0511  NA 139 140  K 3.7 4.0  CL 107 106  CO2 20 23  GLUCOSE 125* 118*  BUN 13 20  CREATININE 0.97 1.12  CALCIUM 8.6 8.7  MG -- --  PHOS -- --    Basename 01/30/12 1752  AST 12  ALT 8  ALKPHOS 30*  BILITOT 0.3  PROT 7.0  ALBUMIN 3.5   No results found for this basename: LIPASE:2,AMYLASE:2 in the last 72 hours  Basename 02/01/12 0626 01/31/12 2304 01/31/12 0511  WBC 6.9 -- 7.4  NEUTROABS -- -- --  HGB 11.4* 11.9* --  HCT 33.8* 35.3* --  MCV 86.4 -- 88.2  PLT 140* -- 146*   No results found for this basename: CKTOTAL:3,CKMB:3,CKMBINDEX:3,TROPONINI:3 in the last 72 hours No components found with this basename: POCBNP:3 No results found for this basename: DDIMER:2 in the last 72 hours No results found for this basename: HGBA1C:2 in the last 72 hours No results found for this basename: CHOL:2,HDL:2,LDLCALC:2,TRIG:2,CHOLHDL:2,LDLDIRECT:2 in the last 72 hours No results found for this basename: TSH,T4TOTAL,FREET3,T3FREE,THYROIDAB in the last 72 hours No results found for this basename:  VITAMINB12:2,FOLATE:2,FERRITIN:2,TIBC:2,IRON:2,RETICCTPCT:2 in the last 72 hours  Micro Results: No results found for this or any previous visit (from the past 240 hour(s)).  Studies/Results: No results found.  Medications: I have reviewed the patient's current medications. Scheduled Meds:    . digoxin  125 mcg Oral q morning - 10a  . diltiazem  120 mg Oral QHS  . lisinopril  2.5 mg Oral QPC breakfast  . metoprolol  25 mg Oral BID  . pantoprazole (PROTONIX) IV  40 mg Intravenous QHS  . DISCONTD: antiseptic oral rinse  15 mL Mouth Rinse q12n4p  . DISCONTD: chlorhexidine  15 mL Mouth Rinse BID   Continuous Infusions:    . 0.9 % NaCl with KCl 20 mEq / L 75 mL/hr at 02/01/12 0631   PRN Meds:.acetaminophen, acetaminophen, albuterol, HYDROcodone-acetaminophen, ipratropium, nitroGLYCERIN, ondansetron (ZOFRAN) IV, ondansetron  Assessment/Plan:   *GI bleed- on blood thinner, held.  prob diverticular- no pain- seen by GI, clear liquids, bleeding scan vs colonscopy Trend H/H- has slowly decreased since admission (IVF/bleeding contributing)   CAD (coronary artery disease)- no CP, anticoagulants held- he will need to discuss with cards which medications are absolutely necessary   Essential hypertension, benign- stable   Pure hypercholesterolemia- stable   Diverticulosis- see above, no sign of infection   COPD (  chronic obstructive pulmonary disease)- stable  Plan per GI bleeding scan vs colonoscopy    LOS: 2 days  Daeja Helderman, DO 02/01/2012, 8:30 AM

## 2012-02-02 ENCOUNTER — Inpatient Hospital Stay (HOSPITAL_COMMUNITY): Payer: Medicare Other

## 2012-02-02 ENCOUNTER — Encounter (HOSPITAL_COMMUNITY)
Admission: EM | Disposition: A | Discharge status: Home / Self Care | Payer: Self-pay | Source: Ambulatory Visit | Attending: Internal Medicine

## 2012-02-02 ENCOUNTER — Encounter (HOSPITAL_COMMUNITY): Payer: Self-pay

## 2012-02-02 DIAGNOSIS — K5731 Diverticulosis of large intestine without perforation or abscess with bleeding: Secondary | ICD-10-CM

## 2012-02-02 DIAGNOSIS — J438 Other emphysema: Secondary | ICD-10-CM

## 2012-02-02 DIAGNOSIS — I25810 Atherosclerosis of coronary artery bypass graft(s) without angina pectoris: Secondary | ICD-10-CM

## 2012-02-02 DIAGNOSIS — R259 Unspecified abnormal involuntary movements: Secondary | ICD-10-CM

## 2012-02-02 HISTORY — PX: COLONOSCOPY: SHX5424

## 2012-02-02 LAB — BASIC METABOLIC PANEL
BUN: 11 mg/dL (ref 6–23)
CO2: 20 mEq/L (ref 19–32)
Chloride: 107 mEq/L (ref 96–112)
Creatinine, Ser: 1.01 mg/dL (ref 0.50–1.35)
GFR calc Af Amer: 83 mL/min — ABNORMAL LOW (ref >90)
Potassium: 3.7 mEq/L (ref 3.5–5.1)

## 2012-02-02 LAB — MAGNESIUM: Magnesium: 2 mg/dL (ref 1.5–2.5)

## 2012-02-02 LAB — DIFFERENTIAL
Basophils Absolute: 0 10*3/uL (ref 0.0–0.1)
Eosinophils Relative: 0 % (ref 0–5)
Lymphocytes Relative: 35 % (ref 12–46)
Monocytes Absolute: 0.4 10*3/uL (ref 0.1–1.0)
Monocytes Relative: 6 % (ref 3–12)
Neutro Abs: 4.3 10*3/uL (ref 1.7–7.7)

## 2012-02-02 LAB — TSH: TSH: 2.012 u[IU]/mL (ref 0.350–4.500)

## 2012-02-02 LAB — CBC
HCT: 31 % — ABNORMAL LOW (ref 39.0–52.0)
Hemoglobin: 10.7 g/dL — ABNORMAL LOW (ref 13.0–17.0)
MCV: 86.6 fL (ref 78.0–100.0)
RDW: 15.7 % — ABNORMAL HIGH (ref 11.5–15.5)
WBC: 7.2 10*3/uL (ref 4.0–10.5)

## 2012-02-02 LAB — T4, FREE: Free T4: 1.19 ng/dL (ref 0.80–1.80)

## 2012-02-02 LAB — PROLACTIN: Prolactin: 6.3 ng/mL (ref 2.1–17.1)

## 2012-02-02 SURGERY — COLONOSCOPY
Anesthesia: Moderate Sedation

## 2012-02-02 MED ORDER — MIDAZOLAM HCL 10 MG/2ML IJ SOLN
INTRAMUSCULAR | Status: AC
  Filled 2012-02-02: qty 4

## 2012-02-02 MED ORDER — FENTANYL CITRATE 0.05 MG/ML IJ SOLN
INTRAMUSCULAR | Status: AC
  Filled 2012-02-02: qty 4

## 2012-02-02 MED ORDER — MIDAZOLAM HCL 5 MG/5ML IJ SOLN
INTRAMUSCULAR | Status: DC | PRN
  Administered 2012-02-02 (×2): 2.5 mg via INTRAVENOUS
  Administered 2012-02-02: 1 mg via INTRAVENOUS
  Administered 2012-02-02 (×2): 2.5 mg via INTRAVENOUS

## 2012-02-02 MED ORDER — FENTANYL NICU IV SYRINGE 50 MCG/ML
INJECTION | INTRAMUSCULAR | Status: DC | PRN
  Administered 2012-02-02 (×4): 25 ug via INTRAVENOUS

## 2012-02-02 MED ORDER — DIPHENHYDRAMINE HCL 50 MG/ML IJ SOLN
INTRAMUSCULAR | Status: AC
  Filled 2012-02-02: qty 1

## 2012-02-02 MED ORDER — LORAZEPAM 2 MG/ML IJ SOLN
0.5000 mg | INTRAMUSCULAR | Status: DC | PRN
  Administered 2012-02-02: 0.5 mg via INTRAVENOUS
  Filled 2012-02-02: qty 1

## 2012-02-02 NOTE — Op Note (Signed)
[facility] [address] [place], [place]  [address]  COLONOSCOPY PROCEDURE REPORT  PATIENT:  [name], [name]  MR#:  [id number] BIRTHDATE:  14-Oct-1940, 71 yrs. old  GENDER:  male ENDOSCOPIST:  Vida Rigger, MD REF. BY: PROCEDURE DATE:  02/02/2012 PROCEDURE:  Colonoscopy 62[address] ASA CLASS:  Class II INDICATIONS:  GI bleeding probably diverticuli MEDICATIONS:  100 mcg fentanyl 11 mg Versed DESCRIPTION OF PROCEDURE:   After the risks benefits and alternatives of the procedure were thoroughly explained, informed consent was obtained.  The [id number] ([id number]) endoscope was introduced through the anus and advanced to the terminal ileum which was intubated for a short distance, the exam was difficult due to lots of old blood and clots as well as multiple diverticuli throughout and a tortuous sigmoid. To advance to the terminal ileum required abdominal pressure only  The quality of the prep was fair..  The instrument was then slowly withdrawn as the colon was fully examined. Lots of washing and suctioning was done to try to delineate the bleeding source and there was old blood beginning in the approximate level of the splenic flexure but no blood in the transverse or the small bowel however with lots of washing and suctioning along the left side of the colon we cannot delineate the bleeding source and we did not see any fresh active bleeding but some fairly recent blood. Once back in the rectum anal rectal pull-through and retroflexion was performed and the scope was readvanced a short ways at the left side of the colon air was suctioned the scope slowly withdrawn the patient tolerated the procedure adequately there was no obvious immediate complication <<PROCEDUREIMAGES>>  FINDINGS: 1 Internal/external small hemorrhoids 2. Diverticuli throughout the colon however with blood only on the left side but no obvious active bleeding and cannot identify the actual bleeding  source 3. Otherwise within normal limits pastor right-sided anastomosis and terminal ileum  COMPLICATIONS:  None  IMPRESSION: Left-sided diverticular bleeding  RECOMMENDATIONS: Continue clear liquids proceed with cardiology evaluation and probable neurologic evaluation if bleeding increases consider nuclear bleeding scan next and can advance diet tomorrow if no signs of further bleeding  ______________________________ Vida Rigger, MD  CC:  n. eSIGNEDVida Rigger at 02/02/2012 10:17 AM  Durenda Hurt, [phone]

## 2012-02-02 NOTE — Progress Notes (Signed)
Event:  RN called NP for new jerking movements of patient. NP to bedside. S: Patient says he has never had anything like with before. He denies hx of seizures or Parkinson's.  He feels well. Denies any pain, SOB, HA, vision changes or weakness. No fever, chills. Nurse states he has remained alert and oriented throughout these episodes.  O: VS and chart reviewed. O2 sat normal. Well appearing elderly WM lying in bed. NAD. He is alert and oriented. RRR. No increased work of breathing. Neuro: no focal deficits. Strength is 5/5. No pronator drift. Face is symmetrical. Tongue midline. Speech is clear and appropriate. Chvostek's sign is negative. Movement is like I have never seen. It is like he is cold and his head, shoulders and arms are briefly trembling. Episodes only last a second or two and repeat shortly. He remains oriented with clear speech throughout episodes.  A/P: 1. Tremors?  Given new to patient, will check a CT head to r/o acute process. Check BMP, CBC with diff, TSH, Free T4, Ionized Ca, Phos, Mag, prolactin. See orders. Pt is not in distress. Will follow. ? Neuro consult.  Maren Reamer, NP [facility]

## 2012-02-02 NOTE — Progress Notes (Signed)
Called by bedside RN to evaluate patient with jerking movements, upon assessment patient alert, oriented, laying in bed noting that nothing like this had ever happened before and he has no history of stroke, seizure, or Parkinson's. Vital signs stable, patient in no acute distress. Patient had several witnessed jerking events. Short jerky movements in upper extremities and face, remains alert the entire time and able to repeat back everything said during the event. NP paged and arrived at bedside, orders for CT head and labs. Will continue to monitor, advised bedside RN to call with any further issues.

## 2012-02-02 NOTE — Progress Notes (Signed)
Pt called RN to room c/o jerking movement of the head which just started tonight and gradually getting worse,he is alert and oriented x4,neuro checks done,all WNL,denies pain or SOB.VS BP 122/75 HR 75 T 98.7 R 20 and O2 98% in ra.Rapid response RN called to bedside.NP on call called and came to floor to assess pt.Will continue to monitor.

## 2012-02-02 NOTE — Progress Notes (Signed)
[facility] Progress Note  02/02/2012   Subjective: Pt tolerated the colonoscopy procedure today.  I spoke with his family.  Pt has no history of epilepsy.  No history of previous jerking movements of the head or neck.  Pt has not been having signs of weakness in hands/arms/feet/legs.  Pt currently lethargic from anesthesia meds.  No current jerking movements of head seen.    Objective:  Vital signs in last 24 hours: Filed Vitals:   02/02/12 1020 02/02/12 1030 02/02/12 1100 02/02/12 1126  BP: 104/49 111/56 75/41 109/42  Pulse:   20   Temp:   97.5 F (36.4 C)   TempSrc:   Oral   Resp: 22 19 20 20   Height:      Weight:      SpO2: 98% 98% 98%    Weight change:   Intake/Output Summary (Last 24 hours) at 02/02/12 1151 Last data filed at 02/02/12 0450  Gross per 24 hour  Intake   5603 ml  Output      0 ml  Net   5603 ml   Lab Results  Component Value Date   HGBA1C 5.8* 10/29/2011   HGBA1C 6.5* 06/28/2011   HGBA1C 6.6* 06/20/2011   Lab Results  Component Value Date   LDLCALC 76 06/20/2011   CREATININE 1.01 02/02/2012    Review of Systems As above, otherwise all reviewed and reported negative  Physical Exam General - awake, no distress, cooperative HEENT - NCAT, MMM Lungs - BBS, CTA CV - normal s1, s2 sounds Abd - soft, nondistended, no masses, nontender Ext - no C/C/E Neuro - pt sedated from anesthesia meds from recent procedure  Lab Results: Results for orders placed during the hospital encounter of 01/30/12 (from the past 24 hour(s))  BASIC METABOLIC PANEL     Status: Abnormal   Collection Time   02/02/12 12:47 AM      Component Value Range   Sodium 138  135 - 145 (mEq/L)   Potassium 3.7  3.5 - 5.1 (mEq/L)   Chloride 107  96 - 112 (mEq/L)   CO2 20  19 - 32 (mEq/L)   Glucose, Bld 82  70 - 99 (mg/dL)   BUN 11  6 - 23 (mg/dL)   Creatinine, Ser 1.61  0.50 - 1.35 (mg/dL)   Calcium 8.6  8.4 - 09.6 (mg/dL)   GFR calc non Af Amer 72 (*) >90 (mL/min)   GFR  calc Af Amer 83 (*) >90 (mL/min)  CBC     Status: Abnormal   Collection Time   02/02/12 12:47 AM      Component Value Range   WBC 7.2  4.0 - 10.5 (K/uL)   RBC 3.58 (*) 4.22 - 5.81 (MIL/uL)   Hemoglobin 10.7 (*) 13.0 - 17.0 (g/dL)   HCT 04.5 (*) 40.9 - 52.0 (%)   MCV 86.6  78.0 - 100.0 (fL)   MCH 29.9  26.0 - 34.0 (pg)   MCHC 34.5  30.0 - 36.0 (g/dL)   RDW 81.1 (*) 91.4 - 15.5 (%)   Platelets 136 (*) 150 - 400 (K/uL)  DIFFERENTIAL     Status: Normal   Collection Time   02/02/12 12:47 AM      Component Value Range   Neutrophils Relative 59  43 - 77 (%)   Neutro Abs 4.3  1.7 - 7.7 (K/uL)   Lymphocytes Relative 35  12 - 46 (%)   Lymphs Abs 2.5  0.7 - 4.0 (K/uL)   Monocytes  Relative 6  3 - 12 (%)   Monocytes Absolute 0.4  0.1 - 1.0 (K/uL)   Eosinophils Relative 0  0 - 5 (%)   Eosinophils Absolute 0.0  0.0 - 0.7 (K/uL)   Basophils Relative 0  0 - 1 (%)   Basophils Absolute 0.0  0.0 - 0.1 (K/uL)  CALCIUM, IONIZED     Status: Normal   Collection Time   02/02/12 12:47 AM      Component Value Range   Calcium, Ion 1.22  1.12 - 1.32 (mmol/L)  PHOSPHORUS     Status: Normal   Collection Time   02/02/12 12:47 AM      Component Value Range   Phosphorus 3.1  2.3 - 4.6 (mg/dL)  MAGNESIUM     Status: Normal   Collection Time   02/02/12 12:47 AM      Component Value Range   Magnesium 2.0  1.5 - 2.5 (mg/dL)    Micro Results: No results found for this or any previous visit (from the past 240 hour(s)).  Medications:  Scheduled Meds:   . digoxin  125 mcg Oral q morning - 10a  . diltiazem  120 mg Oral QHS  . lisinopril  2.5 mg Oral QPC breakfast  . metoprolol  25 mg Oral BID  . pantoprazole  40 mg Oral Q1200  . polyethylene glycol-electrolytes  4,000 mL Oral Once   Continuous Infusions:   . 0.9 % NaCl with KCl 20 mEq / L 75 mL/hr at 02/02/12 0450   PRN Meds:.acetaminophen, acetaminophen, albuterol, HYDROcodone-acetaminophen, ipratropium, LORazepam, nitroGLYCERIN, ondansetron (ZOFRAN)  IV, ondansetron, DISCONTD: fentaNYL, DISCONTD: midazolam  Assessment/Plan: Lower GI bleed - likely from diverticulosis, s/p colonoscopy (no active source of bleeding found).  Continue clears today, advance to reg diet tomorrow, monitor H/H, holding anticoagulants for time being. Cont IVF.  Get tagged RBC scan if persists  CAD - consulted cardiologist Dr. Jacinto Halim to evaluate and determine which meds need need to be restarted as pt is s/p CABG   AFIB - s/p recent cardioversion, recent EKG shows that pt is back in Afib.  Hyperlipidemia - stable  HTN - currently controlled on meds, stable  COPD - stable  New Onset Jerking Movements involving head - I had a long discussion with family, pt has no history of epilepsy or history of these episodes in the past.  Consult inpatient neurology team to evaluate.  CT head neg for acute findings.    Diverticulosis - likely this is the source of pt's GI bleeding.  Plan to advance to regular diet tomorrow, GI following.     LOS: 3 days   Lennon Boutwell 02/02/2012, 11:51 AM   Cleora Fleet, MD, CDE, FAAFP [facility] [place], [place]  [phone]

## 2012-02-02 NOTE — Progress Notes (Signed)
[name] 9:24 AM  Subjective: Patient with2 new problem an abnormal head sensation with normal labs and head CT and is back in A. fib and his cardiologist has been notified but no new complaints and his bleeding seemingly Objective: Vital signs stable afebrile no acute distress lungs are clear heart regular rate and rhythm abdomen is soft nontender hemoglobin slightly decreased 10.7 head CT negative as above electrolytes okay INR a little elevated  Assessment: Multiple medical problems with probable diverticular bleeding  Plan: Okay to proceed with colonoscopy with further workup and plans pending those findings and probably will need a neurologic consult as well  [name] E

## 2012-02-02 NOTE — Consult Note (Signed)
Referring Physician: Laural Benes, MD    Chief Complaint:seizure-like activity  HPI: [name] is an 71 y.o. male admitted with lower GIB 01/30/12.  The patient has history of coronary artery disease with recent CABG. He denies any stent placement. Has AFib.  Recent cardioversion to sinus rhythm.  Found to be back in AFib today. The patient is on anticoagulants Pradaxa and aspirin. He sees [place] cardiology.  Underwent colonoscopy today and diagnosed with left-side diverticular bleed.    Last pm at 23:52 , per nurse; "Patient had several witnessed jerking events. Short jerky movements in upper extremities and face, remains alert the entire time and able to repeat back everything said during the event."  Was hemodynamically stable.  Patient states he was aware of twitching, but could not control it.  No discomfort, HA or visual change.  Past Medical History  Diagnosis Date  . CAD (coronary artery disease)   . Acute non-ST segment elevation myocardial infarction   . Left renal mass   . Diverticulitis   . COPD (chronic obstructive pulmonary disease)   . HTN (hypertension)   . Myocardial infarction 06/19/2011  . Ulcer 1960'S  . Renal neoplasm     LEFT  . Blood transfusion   . Diverticulitis 1996  . Depression     Past Surgical History  Procedure Date  . Coronary artery bypass grafting x4 06/28/11    Dr Tyrone Sage  . Hernia repair surgery x4.   . Coronary artery bypass graft 06/2011    4 VESSELS  . Colon resection 2005    FOR DIVERTICULITIS  . Robot assisted laparoscopic nephrectomy 11/29/2011    Procedure: ROBOTIC ASSISTED LAPAROSCOPIC NEPHRECTOMY;  Surgeon: Crecencio Mc, MD;  Location: [facility] ORS;  Service: Urology;  Laterality: Left;  PARTIAL left nephrectomy    . Cardioversion 01/11/2012    Procedure: CARDIOVERSION;  Surgeon: Pamella Pert, MD;  Location: [facility] OR;  Service: Cardiovascular;  Laterality: N/A;    Family History  Problem Relation Age of Onset  . Heart disease Father    . Alzheimer's disease Mother   . Alcohol abuse Brother    Social History:  Smokes 1/2-1 ppd.  He has never used smokeless tobacco. He reports that he does not drink alcohol or use illicit drugs.  Allergies: No Known Allergies  Medications: Scheduled:   . digoxin  125 mcg Oral q morning - 10a  . diltiazem  120 mg Oral QHS  . lisinopril  2.5 mg Oral QPC breakfast  . metoprolol  25 mg Oral BID  . pantoprazole  40 mg Oral Q1200  . polyethylene glycol-electrolytes  4,000 mL Oral Once    ROS: No fever, chills, URIsx or cough.  No chest pain, SOB or syncope.  No n/v/d/c.  Is status post colonoscopy this am.  No headache, VA change or dizziness.    Physical Examination: Blood pressure 116/49, pulse 83, temperature 97.5 F (36.4 C), temperature source Oral, resp. rate 20, height 6' (1.829 m), weight 90.447 kg (199 lb 6.4 oz), SpO2 98.00%.  Neurologic Examination: Mental Status: Alert,sleepy, oriented.  Speech fluent without evidence of aphasia. Able to follow 3 step commands without difficulty. Cranial Nerves: II- Visual fields grossly intact. III/IV/VI-Extraocular movements intact. No nystagmus. Pupils reactive bilaterally. V/VII-Smile symmetric VIII-grossly intact XI-bilateral shoulder shrug XII-midline tongue extension Motor: 5/5 bilaterally with normal tone and bulk. No twitching noted. Sensory: light touch intact throughout, bilaterally Deep Tendon Reflexes: 2+ and symmetric throughout Plantars: equivocal Cerebellar: Normal finger-to-nose,  Cor - RRR no  m/g/r Lungs- CTA anteriorly Skin- telangictasias face and ear lobes. Extremities- minimal pitting ankles bilaterally  Basic Metabolic Panel:  Lab 02/02/12 1610 02/01/12 0626  NA 138 139  K 3.7 3.7  CL 107 107  CO2 20 20  GLUCOSE 82 125*  BUN 11 13  CREATININE 1.01 0.97  CALCIUM 8.6 8.6  MG 2.0 --  PHOS 3.1 --   Liver Function Tests:  Lab 01/30/12 1752  AST 12  ALT 8  ALKPHOS 30*  BILITOT 0.3  PROT 7.0    ALBUMIN 3.5   CBC:  Lab 02/02/12 0047 02/01/12 1043 02/01/12 0626  WBC 7.2 -- 6.9  NEUTROABS 4.3 -- --  HGB 10.7* 11.6* --  HCT 31.0* 34.4* --  MCV 86.6 -- 86.4  PLT 136* -- 140*    Lab 01/31/12 0511 01/30/12 1752  LABPROT 19.7* 19.7*  INR 1.64* 1.64*    02/02/2012 CT HEAD WITHOUT CONTRAST   Findings: Mild prominence of the sulci, cisterns, and ventricles, in keeping with volume loss. There are mild subcortical and periventricular white matter hypodensities, a nonspecific finding most often seen with chronic microangiopathic changes.  There is no evidence for acute hemorrhage, overt hydrocephalus, mass lesion, or abnormal extra-axial fluid collection.  No definite CT evidence for acute cortical based (large artery) infarction. Atherosclerotic vascular calcification. The visualized paranasal sinuses and mastoid air cells are predominately clear.  IMPRESSION: Mild volume loss and white matter changes as above.  No definite acute intracranial abnormality. Waneta Martins, M.D.    Assessment: 71 y.o. male noted to have rhythmic jerking head movements yesterday and this am.  No history of seizure disorder.  Head CT unremarkable.  Labs do not reveal any significant metabolic derangement.  Presently, patient post-anesthesia and sleepy. Cannot exclude simple partial seizure but currently asymptomatic.  Also with GI bleed can not rule out the possibility of a movement disorder related to build up of blood products.    Plan: 1. EEG 2. Consider MRI. 3. Serum ammonia   4.  These tests may be performed as an outpatient if there is no return of clinical signs.    Discussed with Dr. Thad Ranger.   Marya Fossa PA-C [facility] [phone] 02/02/2012, 12:27 PM  Patient seen and examined. I agree with the above.  Thana Farr, MD [facility] [phone]  02/02/2012  6:52 PM

## 2012-02-03 ENCOUNTER — Inpatient Hospital Stay (HOSPITAL_COMMUNITY)
Admit: 2012-02-03 | Discharge: 2012-02-03 | Disposition: A | Discharge status: Home / Self Care | Payer: Medicare Other | Attending: Family Medicine | Admitting: Family Medicine

## 2012-02-03 ENCOUNTER — Encounter (HOSPITAL_COMMUNITY): Payer: Self-pay | Admitting: Gastroenterology

## 2012-02-03 DIAGNOSIS — J438 Other emphysema: Secondary | ICD-10-CM

## 2012-02-03 DIAGNOSIS — K5731 Diverticulosis of large intestine without perforation or abscess with bleeding: Secondary | ICD-10-CM

## 2012-02-03 DIAGNOSIS — I25810 Atherosclerosis of coronary artery bypass graft(s) without angina pectoris: Secondary | ICD-10-CM

## 2012-02-03 DIAGNOSIS — D649 Anemia, unspecified: Secondary | ICD-10-CM | POA: Diagnosis present

## 2012-02-03 DIAGNOSIS — I4891 Unspecified atrial fibrillation: Secondary | ICD-10-CM

## 2012-02-03 DIAGNOSIS — Z8719 Personal history of other diseases of the digestive system: Secondary | ICD-10-CM

## 2012-02-03 DIAGNOSIS — R259 Unspecified abnormal involuntary movements: Secondary | ICD-10-CM

## 2012-02-03 DIAGNOSIS — I482 Chronic atrial fibrillation, unspecified: Secondary | ICD-10-CM | POA: Diagnosis present

## 2012-02-03 LAB — BASIC METABOLIC PANEL
CO2: 20 mEq/L (ref 19–32)
Chloride: 108 mEq/L (ref 96–112)
Creatinine, Ser: 0.99 mg/dL (ref 0.50–1.35)
GFR calc Af Amer: >90 mL/min (ref >90)
Potassium: 3.5 mEq/L (ref 3.5–5.1)
Sodium: 140 mEq/L (ref 135–145)

## 2012-02-03 LAB — CBC
MCV: 86.4 fL (ref 78.0–100.0)
Platelets: 138 10*3/uL — ABNORMAL LOW (ref 150–400)
RBC: 3.3 MIL/uL — ABNORMAL LOW (ref 4.22–5.81)
RDW: 15.8 % — ABNORMAL HIGH (ref 11.5–15.5)
WBC: 5.9 10*3/uL (ref 4.0–10.5)

## 2012-02-03 MED ORDER — METOPROLOL TARTRATE 25 MG PO TABS: 25.0000 mg | ORAL_TABLET | Freq: Two times a day (BID) | ORAL | Status: DC

## 2012-02-03 MED ORDER — FERROUS SULFATE 325 (65 FE) MG PO TABS: 325.0000 mg | ORAL_TABLET | Freq: Every day | ORAL | Status: DC

## 2012-02-03 MED ORDER — APIXABAN 5 MG PO TABS: 5.0000 mg | ORAL_TABLET | Freq: Two times a day (BID) | ORAL | Status: DC

## 2012-02-03 NOTE — Progress Notes (Signed)
[facility] PROGRESS NOTE    SUBJECTIVE   Patient denies having anymore jerking spells and states that he wants to go home. EEG/MRI not done in hospital. According to the patient, his "other doctors" said he was fine to go home..   OBJECTIVE   Vital signs in last 24 hours: Temp:  [97.5 F (36.4 C)-98.7 F (37.1 C)] 97.7 F (36.5 C) (05/23 0900) Pulse Rate:  [76-90] 83  (05/23 0900) Resp:  [18-20] 18  (05/23 0900) BP: (75-144)/(41-72) 121/72 mmHg (05/23 0900) SpO2:  [98 %-99 %] 99 % (05/23 0522)  Intake/Output from previous day: 05/22 0701 - 05/23 0700 In: 1855.5 [P.O.:240; I.V.:1615.5] Out: -  Intake/Output this shift:   Nutritional status: Clear Liquid  Past Medical History  Diagnosis Date  . CAD (coronary artery disease)   . Acute non-ST segment elevation myocardial infarction   . Left renal mass   . Diverticulitis   . COPD (chronic obstructive pulmonary disease)   . HTN (hypertension)   . Myocardial infarction 06/19/2011  . Ulcer 1960'S  . Renal neoplasm     LEFT  . Blood transfusion   . Diverticulitis 1996  . Depression     Neurologic Exam:   Mental Status: Alert, oriented, thought content appropriate.  Speech fluent without evidence of aphasia. Able to follow 3 step commands without difficulty. Cranial Nerves: II-Visual fields grossly intact. III/IV/VI-Extraocular movements intact.  Pupils reactive bilaterally. V/VII-Smile symmetric VIII-grossly intact IX/X-normal gag XI-bilateral shoulder shrug XII-midline tongue extension Motor: 5/5 bilaterally with normal tone and bulk Sensory: Pinprick and light touch intact throughout, bilaterally Deep Tendon Reflexes: 2+ and symmetric throughout Plantars downgoing bilaterally Cerebellar: Normal finger-to-nose, normal rapid alternating movements and normal heel-to-shin test.   Gait: Normal gait and station.    Lab Results: Lab Results  Component Value  Date/Time   CHOL 132 06/20/2011  5:30 AM   Lipid Panel No results found for this basename: CHOL,TRIG,HDL,CHOLHDL,VLDL,LDLCALC in the last 72 hours  Studies/Results: Ct Head Wo Contrast  02/02/2012  *RADIOLOGY REPORT*  Clinical Data: New uncontrolled movements.  CT HEAD WITHOUT CONTRAST  Technique:  Contiguous axial images were obtained from the base of the skull through the vertex without contrast.  Comparison: 02/18/2010 MRI  Findings: Mild prominence of the sulci, cisterns, and ventricles, in keeping with volume loss. There are mild subcortical and periventricular white matter hypodensities, a nonspecific finding most often seen with chronic microangiopathic changes.  There is no evidence for acute hemorrhage, overt hydrocephalus, mass lesion, or abnormal extra-axial fluid collection.  No definite CT evidence for acute cortical based (large artery) infarction. Atherosclerotic vascular calcification. The visualized paranasal sinuses and mastoid air cells are predominately clear.  IMPRESSION: Mild volume loss and white matter changes as above.  No definite acute intracranial abnormality.  Original Report Authenticated By: Waneta Martins, M.D.    Medications:     Scheduled:   . digoxin  125 mcg Oral q morning - 10a  . diltiazem  120 mg Oral QHS  . lisinopril  2.5 mg Oral QPC breakfast  . metoprolol  25 mg Oral BID  . pantoprazole  40 mg Oral Q1200   CT HEAD WITHOUT CONTRAST 02/02/2012 Mild volume loss and white matter changes as above. No definite  acute intracranial abnormality.  Original  Report Authenticated By: Waneta Martins, M.D.   Assessment/Plan:   Assessment 71 yo male with noted jerking movements of head. None further. He is aware when these are happening.  Plan is for him to go home today.  Plan 1) recommend EEG and MRI brain as outpatient    Guy Franco [facility] Pager [phone]   02/03/2012, 10:25 AM

## 2012-02-03 NOTE — Progress Notes (Signed)
[facility] Progress Note  02/03/2012  Subjective: Called by nurse to come discharge patient NOW.  Pt demanding to go home.  Says he saw his heart doctor this morning and was told he could go home. Pt says he has had no further bleeding from rectum.  He has been tolerating his diet.  I asked him if he would be willing to wait for lunch to see if he tolerates full general diet.  He says he does not want to eat anything here in the hospital.  He just wants to go home.  He says he is willing to follow up closely with his PCP, cardiologist and gastroenterologist.  He has had no further jerking like activities in the head.    Objective:  Vital signs in last 24 hours: Filed Vitals:   02/02/12 1419 02/02/12 2107 02/03/12 0522 02/03/12 0900  BP: 120/62 144/69 118/66 121/72  Pulse: 81 90 76 83  Temp: 98.7 F (37.1 C) 98.3 F (36.8 C) 97.8 F (36.6 C) 97.7 F (36.5 C)  TempSrc: Oral Oral Oral Oral  Resp: 18 18 18 18   Height:      Weight:      SpO2: 99% 98% 99%    Weight change:   Intake/Output Summary (Last 24 hours) at 02/03/12 1012 Last data filed at 02/03/12 0523  Gross per 24 hour  Intake 1855.5 ml  Output      0 ml  Net 1855.5 ml   Lab Results  Component Value Date   HGBA1C 5.8* 10/29/2011   HGBA1C 6.5* 06/28/2011   HGBA1C 6.6* 06/20/2011   Lab Results  Component Value Date   LDLCALC 76 06/20/2011   CREATININE 0.99 02/03/2012    Review of Systems As above, otherwise all reviewed and reported negative  Physical Exam General - awake, no distress, cooperative  HEENT - NCAT, MMM Lungs - BBS, CTA  CV - normal s1, s2 sounds  Abd - soft, nondistended, no masses, nontender  Ext - no C/C/E  Neuro - A&Ox4  Lab Results: Results for orders placed during the hospital encounter of 01/30/12 (from the past 24 hour(s))  AMMONIA     Status: Normal   Collection Time   02/02/12  8:01 PM      Component Value Range   Ammonia 15  11 - 60 (umol/L)  CBC     Status: Abnormal   Collection Time   02/03/12  7:10 AM      Component Value Range   WBC 5.9  4.0 - 10.5 (K/uL)   RBC 3.30 (*) 4.22 - 5.81 (MIL/uL)   Hemoglobin 9.7 (*) 13.0 - 17.0 (g/dL)   HCT 08.6 (*) 57.8 - 52.0 (%)   MCV 86.4  78.0 - 100.0 (fL)   MCH 29.4  26.0 - 34.0 (pg)   MCHC 34.0  30.0 - 36.0 (g/dL)   RDW 46.9 (*) 62.9 - 15.5 (%)   Platelets 138 (*) 150 - 400 (K/uL)  BASIC METABOLIC PANEL     Status: Abnormal   Collection Time   02/03/12  7:10 AM      Component Value Range   Sodium 140  135 - 145 (mEq/L)   Potassium 3.5  3.5 - 5.1 (mEq/L)   Chloride 108  96 - 112 (mEq/L)   CO2 20  19 - 32 (mEq/L)   Glucose, Bld 74  70 - 99 (mg/dL)   BUN 11  6 - 23 (mg/dL)   Creatinine, Ser 5.28  0.50 - 1.35 (mg/dL)  Calcium 8.6  8.4 - 10.5 (mg/dL)   GFR calc non Af Amer 79 (*) >90 (mL/min)   GFR calc Af Amer >90  >90 (mL/min)    Micro Results: No results found for this or any previous visit (from the past 240 hour(s)).  Medications:  Scheduled Meds:   . digoxin  125 mcg Oral q morning - 10a  . diltiazem  120 mg Oral QHS  . lisinopril  2.5 mg Oral QPC breakfast  . metoprolol  25 mg Oral BID  . pantoprazole  40 mg Oral Q1200   Continuous Infusions:   . 0.9 % NaCl with KCl 20 mEq / L 75 mL/hr at 02/03/12 0519   PRN Meds:.acetaminophen, acetaminophen, albuterol, HYDROcodone-acetaminophen, ipratropium, LORazepam, nitroGLYCERIN, ondansetron (ZOFRAN) IV, ondansetron  Assessment/Plan: Lower GI bleed - likely from diverticulosis, s/p colonoscopy (no active source of bleeding found). Tolerating diet, no further rectal bleeding reported  CAD - consulted cardiologist Dr. Jacinto Halim - I spoke with him at length, he is concerned that pt is at high risk for CVA given his atrial fibrillation and he has recommended that the patient be anticoagulated with Eliquis 10 mg bid.  He says that he is happy to follow the patient closely on an outpatient basis.   AFIB - s/p recent cardioversion, recent EKG shows that pt is  back in Afib. As above.   Hyperlipidemia - stable  Anemia - Pt will need to have his cbc rechecked in the next several days by his pcp.   HTN - currently controlled on meds, stable  COPD - stable  New Onset Jerking Movements involving head - Pt was seen by neurology.  They have recommended an outpatient workup.  He has had no recurrence of the symptoms.   Diverticulosis - likely this is the source of pt's GI bleeding. Plan to advance to regular diet.  GI following. I explained to the patient and his family the risks of going home to soon, including a recurrence of GI bleeding, etc. they verbalized understanding and insisted that pt be discharged home.     LOS: 4 days   Aleighya Mcanelly 02/03/2012, 10:12 AM   Cleora Fleet, MD, CDE, FAAFP [facility] [place], [place]  [phone]

## 2012-02-03 NOTE — Progress Notes (Signed)
Patient discharged to home, instructions given and verbalized understanding, escorted by daughter.

## 2012-02-03 NOTE — Procedures (Signed)
EEG NUMBER:  REFERRING PHYSICIAN:  Dr. Laural Benes.  HISTORY:  A 71 year old male with abnormal involuntary movements.  MEDICATIONS:  Lanoxin, diltiazem, lisinopril, Lopressor, Protonix.  CONDITIONS OF RECORDING:  This is a 16 channel EEG carried out with the patient in the awake and drowsy states.  DESCRIPTION:  The waking background activity consists of a low-voltage, symmetrical, fairly well-organized 8 Hz alpha activity seen from the parieto-occipital and posterotemporal regions.  Low-voltage, fast activity, poorly organized was seen anteriorly at times, superimposed on more posterior rhythms.  A mixture of theta and alpha rhythm was seen from the central and temporal regions.  The patient drowses with slowing to irregular which is theta and beta activity.  Stage II sleep was not obtained.  Hypoventilation was performed and produced a mild-to-moderate buildup, failed to elicit any abnormalities.  Intermittent photic stimulation was performed, but failed to elicit any change in the tracing.  IMPRESSION:  This is a normal EEG.  No epileptiform activity was noted.          ______________________________ Thana Farr, MD    [id number] D:  02/03/2012 18:46:51  T:  02/03/2012 21:28:20  Job #:  [id number]

## 2012-02-03 NOTE — Consult Note (Signed)
CARDIOLOGY CONSULT NOTE  Patient ID: [name] MRN: [id number] DOB/AGE: 1941/08/02 71 y.o.  Admit date: 01/30/2012 Referring Physician [facility] Primary Physician: Benita Stabile, MD, MD Reason for Consultation Patient with GI bleed requested to consult regarding anticoagulation  HPI: [name] is a15 year old Caucasian male with history of coronary artery disease and atrial fibrillation.  He had recently undergone atrial fibrillation cardioversion to sinus rhythm.  However I had seen him back in the office on 01/26/2012 and he was found to be back in atrial fibrillation.  As he was asymptomatic I had recommended that we do rate control strategy only and I was going to see him back in 6 months. Patient was admitted to the hospital 2 days ago with complaints of rectal bleed.  He underwent colonoscopy and was found to have diverticulosis but no active bleeding.  External hemorrhoids were also noted. Patient presently is asymptomatic.  No further bleeding.  He denies any chest pain, shortness of breath, PND or orthopnea.  Past Medical History  Diagnosis Date  . CAD (coronary artery disease)   . Acute non-ST segment elevation myocardial infarction   . Left renal mass   . Diverticulitis   . COPD (chronic obstructive pulmonary disease)   . HTN (hypertension)   . Myocardial infarction 06/19/2011  . Ulcer 1960'S  . Renal neoplasm     LEFT  . Blood transfusion   . Diverticulitis 1996  . Depression      Past Surgical History  Procedure Date  . Coronary artery bypass grafting x4 06/28/11    Dr Tyrone Sage  . Hernia repair surgery x4.   . Coronary artery bypass graft 06/2011    4 VESSELS  . Colon resection 2005    FOR DIVERTICULITIS  . Robot assisted laparoscopic nephrectomy 11/29/2011    Procedure: ROBOTIC ASSISTED LAPAROSCOPIC NEPHRECTOMY;  Surgeon: Crecencio Mc, MD;  Location: [facility];  Service: Urology;  Laterality: Left;  PARTIAL left nephrectomy    . Cardioversion 01/11/2012   Procedure: CARDIOVERSION;  Surgeon: Pamella Pert, MD;  Location: [facility] OR;  Service: Cardiovascular;  Laterality: N/A;  . Colonoscopy 02/02/2012    Procedure: COLONOSCOPY;  Surgeon: Petra Kuba, MD;  Location: [facility] ENDOSCOPY;  Service: Endoscopy;  Laterality: N/A;  magod /ja     Family History  Problem Relation Age of Onset  . Heart disease Father   . Alzheimer's disease Mother   . Alcohol abuse Brother     Social History: History   Social History  . Marital Status: Married    Spouse Name: N/A    Number of Children: 3  . Years of Education: N/A   Occupational History  .     Social History Main Topics  . Smoking status: Former Smoker    Types: Cigarettes    Quit date: 06/19/2011  . Smokeless tobacco: Never Used  . Alcohol Use: No  . Drug Use: No  . Sexually Active: Not on file   Other Topics Concern  . Not on file   Social History Narrative  . No narrative on file     Prescriptions prior to admission  Medication Sig Dispense Refill  . digoxin (LANOXIN) 0.125 MG tablet Take 125 mcg by mouth every morning.      . diltiazem (DILACOR XR) 120 MG 24 hr capsule Take 120 mg by mouth at bedtime.      . fenofibrate (TRICOR) 145 MG tablet Take 145 mg by mouth daily after breakfast.       .  lisinopril (PRINIVIL,ZESTRIL) 2.5 MG tablet Take 2.5 mg by mouth daily after breakfast.       . nitroGLYCERIN (NITROSTAT) 0.4 MG SL tablet Place 0.4 mg under the tongue every 5 (five) minutes as needed. For chest pain      . omega-3 acid ethyl esters (LOVAZA) 1 G capsule Take 2 g by mouth 2 (two) times daily.       . polyvinyl alcohol (LIQUIFILM TEARS) 1.4 % ophthalmic solution Place 1 drop into both eyes 2 (two) times daily as needed. Dry eyes      . DISCONTD: aspirin 81 MG EC tablet Take 81 mg by mouth daily after breakfast.      . DISCONTD: dabigatran (PRADAXA) 150 MG CAPS Take 150 mg by mouth every 12 (twelve) hours.      Marland Kitchen DISCONTD: furosemide (LASIX) 20 MG tablet Take 20 mg by mouth  daily as needed. For fluid retention       . DISCONTD: metoprolol (LOPRESSOR) 50 MG tablet Take 25 mg by mouth 2 (two) times daily.         ROS: General: no fevers/chills/night sweats Eyes: no blurry vision, diplopia, or amaurosis ENT: no sore throat or hearing loss Resp: no cough, wheezing, or hemoptysis CV: no edema or palpitations GI: no abdominal pain, nausea, vomiting, diarrhea, or constipation GU: no dysuria, frequency, or hematuria Skin: no rash Neuro: no headache, numbness, tingling, or weakness of extremities Musculoskeletal: no joint pain or swelling Heme: no bleeding, DVT, or easy bruising Endo: no polydipsia or polyuria    Physical Exam: Blood pressure 121/72, pulse 83, temperature 97.7 F (36.5 C), temperature source Oral, resp. rate 18, height 6' (1.829 m), weight 90.447 kg (199 lb 6.4 oz), SpO2 99.00%.   Well build and normal body habitus who is  in no acute distress.   Appears stated age.   Alert Ox3.   Very emtional.  HEENT: normal limits.   There is no cyanosis.   No JVD.     CARDIAC EXAM: S1 variable and irregular, S2 normal, No murmur or gallop.    CHEST EXAM: No tenderness of chest wall.   CABG sternotomy site is well-healed.     LUNGS: Clear to percuss and auscultate.   No rales or ronchi.     ABDOMEN: No hepatosplenomegaly.   BS normal in all 4 quadrants.   Abdomen is non-tender. Surgical incision sites have healed well.     EXTREMITY:  2 + edema below knee.   Normal pulses.   FROM.   VASCULAR EXAM: No skin breakdown.   Carotids  normal.   Extremities: Femoral pulse normal.   Popliteal pulse normal ; Pedal pulse normal.  Labs:   Lab Results  Component Value Date   WBC 5.9 02/03/2012   HGB 9.7* 02/03/2012   HCT 28.5* 02/03/2012   MCV 86.4 02/03/2012   PLT 138* 02/03/2012    Lab 02/03/12 0710 01/30/12 1752  NA 140 --  K 3.5 --  CL 108 --  CO2 20 --  BUN 11 --  CREATININE 0.99 --  CALCIUM 8.6 --  PROT -- 7.0  BILITOT -- 0.3  ALKPHOS -- 30*    ALT -- 8  AST -- 12  GLUCOSE 74 --   Lab Results  Component Value Date   CKTOTAL 43 10/27/2011   CKMB 3.7 10/27/2011   TROPONINI <0.30 10/27/2011    Lab Results  Component Value Date   CHOL 132 06/20/2011   Lab Results  Component Value  Date   HDL 27* 06/20/2011   Lab Results  Component Value Date   LDLCALC 76 06/20/2011   Lab Results  Component Value Date   TRIG 147 06/20/2011   Lab Results  Component Value Date   CHOLHDL 4.9 06/20/2011   No results found for this basename: LDLDIRECT      Radiology: Ct Head Wo Contrast  02/02/2012  *RADIOLOGY REPORT*  Clinical Data: New uncontrolled movements.  CT HEAD WITHOUT CONTRAST  Technique:  Contiguous axial images were obtained from the base of the skull through the vertex without contrast.  Comparison: 02/18/2010 MRI  Findings: Mild prominence of the sulci, cisterns, and ventricles, in keeping with volume loss. There are mild subcortical and periventricular white matter hypodensities, a nonspecific finding most often seen with chronic microangiopathic changes.  There is no evidence for acute hemorrhage, overt hydrocephalus, mass lesion, or abnormal extra-axial fluid collection.  No definite CT evidence for acute cortical based (large artery) infarction. Atherosclerotic vascular calcification. The visualized paranasal sinuses and mastoid air cells are predominately clear.  IMPRESSION: Mild volume loss and white matter changes as above.  No definite acute intracranial abnormality.  Original Report Authenticated By: Waneta Martins, M.D.    EKG:EKG done on 02/02/2012 reveals atrial fibrillation with ventricular rate of 95 bpm.  Right bundle branch block.  Secondary ST-T wave changes due to right bundle branch block.  Nonspecific T-wave inversion in inferior leads.  No significant change from prior EKG on 01/11/12  ASSESSMENT AND PLAN:  1.  Permanent atrial fibrillation with controlled ventricular response 2.  History of GI bleed probably  due to external hemorrhoidal bleed.  Hemoglobin remains stable. 3. CAD/ASHD  H/O  NSTEMI S/P CABGs on 06/28/2011.   LIMA to LAD, SVG to circumflex, sequential SVG to PDA and PL of RCA. Ofilia Neas, MD 4. Hypertension at goal.    5. Hyperlipidemia.    6. Diabetes Mellitus II new onset since admssion to the hospital with NQMI.  Diet controlled only. HbA1c normal last admission in April 2013. 7. Tobacco use disorder: Has started to smoke again. Motivated to quitting. Smokes 2 cig a day.  8. Left renal mass h/o Left nephrectomy 11/29/11  Recommendation: Patient with recent congestive heart failure due to atrial fibrillation with rapid response, coronary artery disease, has a CHADS2-VASc Score of atleast 2 with yearly risk of stroke of 2.2% and hence needs anticoagulation unless contraindication present. No active GI bleed and hence with history of GI bleed I have recommended to stop Pradaxa and I will start the patient on Eliquis 5 mg po BID. I have discussed with the patient and his daughter at the bedside regarding starting Coumadin but they prefer to be on new agent due to the ease of use.  They're aware of risks, benefits and alternate use at this point.  From cardiac standpoint he can be discharged home and patient will pick up samples from my office and start a medication today.  Pamella Pert, MD 02/03/2012, 10:44 AM

## 2012-02-03 NOTE — Discharge Instructions (Signed)
Go and Pick up samples of Eliquis from Dr. Verl Dicker office and start today.  PLEASE FOLLOW UP RESULTS OF YOUR EEG STUDY WITH YOUR PRIMARY CARE PHYSICIAN  Diverticulosis Diverticulosis is a common condition that develops when small pouches (diverticula) form in the wall of the colon. The risk of diverticulosis increases with age. It happens more often in people who eat a low-fiber diet. Most individuals with diverticulosis have no symptoms. Those individuals with symptoms usually experience abdominal pain, constipation, or loose stools (diarrhea). HOME CARE INSTRUCTIONS   Increase the amount of fiber in your diet as directed by your caregiver or dietician. This may reduce symptoms of diverticulosis.   Your caregiver may recommend taking a dietary fiber supplement.   Drink at least 6 to 8 glasses of water each day to prevent constipation.   Try not to strain when you have a bowel movement.   Your caregiver may recommend avoiding nuts and seeds to prevent complications, although this is still an uncertain benefit.   Only take over-the-counter or prescription medicines for pain, discomfort, or fever as directed by your caregiver.  FOODS WITH HIGH FIBER CONTENT INCLUDE:  Fruits. Apple, peach, pear, tangerine, raisins, prunes.   Vegetables. Brussels sprouts, asparagus, broccoli, cabbage, carrot, cauliflower, romaine lettuce, spinach, summer squash, tomato, winter squash, zucchini.   Starchy Vegetables. Baked beans, kidney beans, lima beans, split peas, lentils, potatoes (with skin).   Grains. Whole wheat bread, brown rice, bran flake cereal, plain oatmeal, white rice, shredded wheat, bran muffins.  SEEK IMMEDIATE MEDICAL CARE IF:   You develop increasing pain or severe bloating.   You have an oral temperature above 102 F (38.9 C), not controlled by medicine.   You develop vomiting or bowel movements that are bloody or black.  Document Released: 05/27/2004 Document Revised: 08/19/2011  Document Reviewed: 01/28/2010 [facility] Patient Information 2012 [place], [place].  Atrial Fibrillation Your caregiver has diagnosed you with atrial fibrillation (AFib). The heart normally beats very regularly; AFib is a type of irregular heartbeat. The heart rate may be faster or slower than normal. This can prevent your heart from pumping as well as it should. AFib can be constant (chronic) or intermittent (paroxysmal). CAUSES  Atrial fibrillation may be caused by:  Heart disease, including heart attack, coronary artery disease, heart failure, diseases of the heart valves, and others.   Blood clot in the lungs (pulmonary embolism).   Pneumonia or other infections.   Chronic lung disease.   Thyroid disease.   Toxins. These include alcohol, some medications (such as decongestant medications or diet pills), and caffeine.  In some people, no cause for AFib can be found. This is referred to as Lone Atrial Fibrillation. SYMPTOMS   Palpitations or a fluttering in your chest.   A vague sense of chest discomfort.   Shortness of breath.   Sudden onset of lightheadedness or weakness.  Sometimes, the first sign of AFib can be a complication of the condition. This could be a stroke or heart failure. DIAGNOSIS  Your description of your condition may make your caregiver suspicious of atrial fibrillation. Your caregiver will examine your pulse to determine if fibrillation is present. An EKG (electrocardiogram) will confirm the diagnosis. Further testing may help determine what caused you to have atrial fibrillation. This may include chest x-ray, echocardiogram, blood tests, or CT scans. PREVENTION  If you have previously had atrial fibrillation, your caregiver may advise you to avoid substances known to cause the condition (such as stimulant medications, and possibly caffeine or  alcohol). You may be advised to use medications to prevent recurrence. Proper treatment of any underlying condition is  important to help prevent recurrence. PROGNOSIS  Atrial fibrillation does tend to become a chronic condition over time. It can cause significant complications (see below). Atrial fibrillation is not usually immediately life-threatening, but it can shorten your life expectancy. This seems to be worse in women. If you have lone atrial fibrillation and are under 18 years old, the risk of complications is very low, and life expectancy is not shortened. RISKS AND COMPLICATIONS  Complications of atrial fibrillation can include stroke, chest pain, and heart failure. Your caregiver will recommend treatments for the atrial fibrillation, as well as for any underlying conditions, to help minimize risk of complications. TREATMENT  Treatment for AFib is divided into several categories:  Treatment of any underlying condition.   Converting you out of AFib into a regular (sinus) rhythm.   Controlling rapid heart rate.   Prevention of blood clots and stroke.  Medications and procedures are available to convert your atrial fibrillation to sinus rhythm. However, recent studies have shown that this may not offer you any advantage, and cardiac experts are continuing research and debate on this topic. More important is controlling your rapid heartbeat. The rapid heartbeat causes more symptoms, and places strain on your heart. Your caregiver will advise you on the use of medications that can control your heart rate. Atrial fibrillation is a strong stroke risk. You can lessen this risk by taking blood thinning medications such as Coumadin (warfarin), or sometimes aspirin. These medications need close monitoring by your caregiver. Over-medication can cause bleeding. Too little medication may not protect against stroke. HOME CARE INSTRUCTIONS   If your caregiver prescribed medicine to make your heartbeat more normally, take as directed.   If blood thinners were prescribed by your caregiver, take EXACTLY as directed.    Perform blood tests EXACTLY as directed.   Quit smoking. Smoking increases your cardiac and lung (pulmonary) risks.   DO NOT drink alcohol.   DO NOT drink caffeinated drinks (e.g. coffee, soda, chocolate, and leaf teas). You may drink decaffeinated coffee, soda or tea.   If you are overweight, you should choose a reduced calorie diet to lose weight. Please see a registered dietitian if you need more information about healthy weight loss. DO NOT USE DIET PILLS as they may aggravate heart problems.   If you have other heart problems that are causing AFib, you may need to eat a low salt, fat, and cholesterol diet. Your caregiver will tell you if this is necessary.   Exercise every day to improve your physical fitness. Stay active unless advised otherwise.   If your caregiver has given you a follow-up appointment, it is very important to keep that appointment. Not keeping the appointment could result in heart failure or stroke. If there is any problem keeping the appointment, you must call back to this facility for assistance.  SEEK MEDICAL CARE IF:  You notice a change in the rate, rhythm or strength of your heartbeat.   You develop an infection or any other change in your overall health status.  SEEK IMMEDIATE MEDICAL CARE IF:   You develop chest pain, abdominal pain, sweating, weakness or feel sick to your stomach (nausea).   You develop shortness of breath.   You develop swollen feet and ankles.   You develop dizziness, numbness, or weakness of your face or limbs, or any change in vision or speech.  MAKE SURE  YOU:   Understand these instructions.   Will watch your condition.   Will get help right away if you are not doing well or get worse.  Document Released: 08/30/2005 Document Revised: 08/19/2011 Document Reviewed: 04/03/2008 [facility] Patient Information 2012 [place], [place].  Gastrointestinal Bleeding Bleeding in the gastrointestinal tract comes out when you throw up  (vomit) or poop. Treatment will depend on how fast the blood is flowing, where it is coming from, and the cause. A small amount of bleeding that stops on its own may not need treatment.  HOME CARE  Do not drink alcohol.   Do not eat things that upset your stomach or give you heartburn.   Rest and limit your activity.   Do not smoke. Smoking may make your problems worse.   Wash your hands or use sanitizer every time you use the bathroom. Some bleeding is caused by germs.   Only take medicine as told by your doctor.  GET HELP RIGHT AWAY IF:   Your throw up looks like coffee grounds or is dark or bright red.   Your poop is black or tarry. You see blood in the toilet.   You feel weak, dizzy, and short of breath.   You breathe fast and have a fast heartbeat.   You have bad stomach pain or cramping.  MAKE SURE YOU:   Understand these instructions.   Will watch your condition.   Will get help right away if you are not doing well or get worse.  Document Released: 06/08/2008 Document Revised: 08/19/2011 Document Reviewed: 08/09/2011 [facility] Patient Information 2012 [place], [place].  Rectal Bleeding  Rectal bleeding is when blood comes out of the opening of the butt (anus). Rectal bleeding may show up as bright red blood or really dark poop (stool). The poop may look dark red, maroon, or black. Rectal bleeding is often a sign that something is wrong. This needs to be checked by a doctor.  HOME CARE  Eat a diet high in fiber. This will help keep your poop soft.   Limit activitiy.   Drink enough fluids to keep your pee (urine) clear or pale yellow.   Take a warm bath to soothe any pain.   Follow up with your doctor as told.  GET HELP RIGHT AWAY IF:  You have more bleeding.   You have black or dark red poop.   You throw up (vomit) blood or it looks like coffee grounds.   You have belly (abdominal) pain or tenderness.   You have a fever.   You feel weak, sick to your  stomach (nauseous), or you pass out (faint).   You have pain that is so bad you cannot poop (bowel movement).  MAKE SURE YOU:  Understand these instructions.   Will watch your condition.   Will get help right away if you are not doing well or get worse.  Document Released: 05/12/2011 Document Revised: 08/19/2011 Document Reviewed: 05/12/2011 [facility] Patient Information 2012 [place], [place].

## 2012-02-03 NOTE — Discharge Summary (Signed)
Physician Discharge Summary  Patient ID: [name] MRN: [id number] DOB/AGE: [birth year] 71 y.o.  Admit date: 01/30/2012 Discharge date: 02/03/2012  Discharge Diagnoses:   *GI bleed - presumed diverticular   s/p colonoscopy 02/01/12 (Dr. Ewing Schlein)  CAD (coronary artery disease)  Chronic a-fib  Essential hypertension, benign  Pure hypercholesterolemia  Diverticulosis  COPD (chronic obstructive pulmonary disease)  History of GI diverticular bleed   Anemia  Discharged Condition: good  Hospital Course: From H&P: This is a 71 year old gentleman with known history of diverticulosis, status post colectomy 2000. He states he's not had any GI bleed since. His last colonoscopy was in 2007. This was reported normal no evidence of diverticulosis. Patient states he woke up this morning noticed some blood in the stool. He went to church, came home and again had bright red and dark brown blood with bowel movements. This again repeated later in the day, they came to the ER. The patient denies any lightheaded or dizziness. Patient denies any abdominal pain, nausea or vomiting or diarrhea. He denies any fevers or chills. The patient does have a history of coronary artery disease with recent CABG. He denies any stent placement. He also had a recent cardioversion for Afib (Dr. Ewing Schlein) and now has recurrent Afib.   The patient was seen by the gastroenterology service and had a colonoscopy that was performed by Dr. Ewing Schlein.  It revealed the patient had persistent diverticulosis.  The source of bleeding was not determined.  It is thought that the bleeding has been related to the diverticulosis.  The patient was monitored and after the colonoscopy had no further GI bleeding.  He tolerated diet in and demanded that he be discharged home.  I counseled him extensively with his family members present about the risk of going home and he verbalized understanding and they verbalize understanding.  The patient was adamant that he  be discharged home.  I also spoke with the patient's cardiologist and consult him to see the patient in the hospital.  He was very concerned about the patient's high-risk for having a stroke now that he has chronic atrial fibrillation.  His cardioversion failed.  The patient had a controlled rate atrial fibrillation and the cardiologist recommended that he try a new medication called Eliquis 5 mg bid for anticoagulation.  The risks were explained to the patient in detail and to his family.  The patient could have recurrent GI bleeding.  They verbalized understanding.   The patient also had some episodes where he had jerking head movements.  Neurology was consult and see the patient.  They recommended that he have an EEG study.  They felt that it was safe for the patient to have an outpatient workup for seizures.  They felt that it was low likelihood that he was experiencing seizures.  He had no further recurrence of these episodes.  He did have an EEG prior to discharge and that will need to be followed up with his primary care physician.  I also strongly encouraged the patient followup with his cardiologist, gastroenterologist and primary care physician closely.  He said that he would do so.  His family reported that they would make sure that he had his followup appointments.  The patient's cardiologist Dr. Jacinto Halim reported that the patient can stop by his office to pick up samples for the Eloquis medication that he is starting for anticoagulation.  If the patient has recurrence of bleeding he will need a tagged white blood cell scan to help  try and identify the source of his bleeding.  His hemoglobin at discharge is 9.7.    Consults: cardiology(Ganji), GI (Magod) and neurology (Reynolds)  Significant Diagnostic Studies: EEG - pending at time of discharge (to be follow up by PCP), Colonoscopy (Please see full report).   DischargeVitals: Blood pressure 121/72, pulse 83, temperature 97.7 F (36.5 C),  temperature source Oral, resp. rate 18, height 6' (1.829 m), weight 90.447 kg (199 lb 6.4 oz), SpO2 99.00%.  Disposition: 01-Home or Self Care  Discharge Orders    Future Appointments: Provider: Department: Dept Phone: Center:   02/03/2012 11:00 AM [facility]  None     Future Orders Please Complete By Expires   Diet - low sodium heart healthy      Comments:   High fiber recommended   Increase activity slowly      Discharge instructions      Comments:   Please follow up with your PCP to have your CBC rechecked in the next 5 days Return if symptoms recur, worsen or new problems develop Please don't miss your follow up appointments      Medication List  As of 02/03/2012 10:44 AM   STOP taking these medications         aspirin 81 MG EC tablet      dabigatran 150 MG Caps      furosemide 20 MG tablet         TAKE these medications         apixaban 5 MG Tabs tablet   Commonly known as: ELIQUIS   Take 1 tablet (5 mg total) by mouth 2 (two) times daily.   Start taking on: 02/04/2012      digoxin 0.125 MG tablet   Commonly known as: LANOXIN   Take 125 mcg by mouth every morning.      diltiazem 120 MG 24 hr capsule   Commonly known as: DILACOR XR   Take 120 mg by mouth at bedtime.      fenofibrate 145 MG tablet   Commonly known as: TRICOR   Take 145 mg by mouth daily after breakfast.      ferrous sulfate 325 (65 FE) MG tablet   Take 1 tablet (325 mg total) by mouth daily with breakfast.      lisinopril 2.5 MG tablet   Commonly known as: PRINIVIL,ZESTRIL   Take 2.5 mg by mouth daily after breakfast.      metoprolol tartrate 25 MG tablet   Commonly known as: LOPRESSOR   Take 1 tablet (25 mg total) by mouth 2 (two) times daily.      nitroGLYCERIN 0.4 MG SL tablet   Commonly known as: NITROSTAT   Place 0.4 mg under the tongue every 5 (five) minutes as needed. For chest pain      omega-3 acid ethyl esters 1 G capsule   Commonly known as: LOVAZA   Take 2 g by  mouth 2 (two) times daily.      polyvinyl alcohol 1.4 % ophthalmic solution   Commonly known as: LIQUIFILM TEARS   Place 1 drop into both eyes 2 (two) times daily as needed. Dry eyes           Follow-up Information    Follow up with Benita Stabile, MD in 5 days. (Recheck CBC and Hospital Follow Up)    Contact information:   [facility] [address] [place], [address] [place] [address] [phone]  Follow up with Pamella Pert, MD in 1 week.   Contact information:   [address]  [place] [address] [phone]       Follow up with [facility] E, MD in 2 weeks. (Follow up Hospital GI Bleed)    Contact information:   [address] [employer], [place]. [facility] [place] [address] [phone]         I spent 39 mins preparing discharge, reviewing records, counseling pt and family, speaking with pt's consulting physicians, arranging for follow up.    Signed: Janat Tabbert 02/03/2012, 10:44 AM

## 2012-05-18 ENCOUNTER — Emergency Department (HOSPITAL_COMMUNITY)
Admission: EM | Admit: 2012-05-18 | Discharge: 2012-05-18 | Disposition: A | Discharge status: Home / Self Care | Payer: Medicare Other | Attending: Emergency Medicine | Admitting: Emergency Medicine

## 2012-05-18 ENCOUNTER — Encounter (HOSPITAL_COMMUNITY): Payer: Self-pay | Admitting: Emergency Medicine

## 2012-05-18 DIAGNOSIS — K625 Hemorrhage of anus and rectum: Secondary | ICD-10-CM | POA: Insufficient documentation

## 2012-05-18 DIAGNOSIS — R42 Dizziness and giddiness: Secondary | ICD-10-CM | POA: Insufficient documentation

## 2012-05-18 DIAGNOSIS — K5732 Diverticulitis of large intestine without perforation or abscess without bleeding: Secondary | ICD-10-CM | POA: Insufficient documentation

## 2012-05-18 LAB — CBC
MCH: 28.4 pg (ref 26.0–34.0)
MCHC: 33.8 g/dL (ref 30.0–36.0)
MCV: 83.9 fL (ref 78.0–100.0)
Platelets: 177 10*3/uL (ref 150–400)

## 2012-05-18 LAB — BASIC METABOLIC PANEL
Calcium: 9.5 mg/dL (ref 8.4–10.5)
Creatinine, Ser: 1.22 mg/dL (ref 0.50–1.35)
GFR calc non Af Amer: 57 mL/min — ABNORMAL LOW (ref >90)
Glucose, Bld: 120 mg/dL — ABNORMAL HIGH (ref 70–99)
Sodium: 135 mEq/L (ref 135–145)

## 2012-05-18 NOTE — ED Notes (Signed)
Reports having rectal bleeding since yesterday, reports bright red and dark blood mixed, does reports seeing clots; having BMs about q2-3hours; does report a history of same in past; reports hx of diverticulitis- Dr Macario Golds is GI MD; reports slight lightheadedness; color is pink, warm

## 2012-06-21 ENCOUNTER — Other Ambulatory Visit (HOSPITAL_COMMUNITY): Payer: Self-pay | Admitting: Urology

## 2012-06-21 ENCOUNTER — Ambulatory Visit (HOSPITAL_COMMUNITY)
Admission: RE | Admit: 2012-06-21 | Discharge: 2012-06-21 | Disposition: A | Discharge status: Home / Self Care | Payer: Medicare Other | Source: Ambulatory Visit | Attending: Urology | Admitting: Urology

## 2012-06-21 DIAGNOSIS — Z951 Presence of aortocoronary bypass graft: Secondary | ICD-10-CM | POA: Insufficient documentation

## 2012-06-21 DIAGNOSIS — I700 Atherosclerosis of aorta: Secondary | ICD-10-CM | POA: Insufficient documentation

## 2012-06-21 DIAGNOSIS — C649 Malignant neoplasm of unspecified kidney, except renal pelvis: Secondary | ICD-10-CM | POA: Insufficient documentation

## 2012-06-21 DIAGNOSIS — J984 Other disorders of lung: Secondary | ICD-10-CM | POA: Insufficient documentation

## 2013-07-11 ENCOUNTER — Other Ambulatory Visit (HOSPITAL_COMMUNITY): Payer: Self-pay | Admitting: Urology

## 2013-07-11 ENCOUNTER — Ambulatory Visit (HOSPITAL_COMMUNITY)
Admission: RE | Admit: 2013-07-11 | Discharge: 2013-07-11 | Disposition: A | Discharge status: Home / Self Care | Payer: Medicare Other | Source: Ambulatory Visit | Attending: Urology | Admitting: Urology

## 2013-07-11 DIAGNOSIS — Z951 Presence of aortocoronary bypass graft: Secondary | ICD-10-CM | POA: Insufficient documentation

## 2013-07-11 DIAGNOSIS — C649 Malignant neoplasm of unspecified kidney, except renal pelvis: Secondary | ICD-10-CM

## 2013-07-11 DIAGNOSIS — Z85528 Personal history of other malignant neoplasm of kidney: Secondary | ICD-10-CM | POA: Insufficient documentation

## 2013-07-11 DIAGNOSIS — Z87891 Personal history of nicotine dependence: Secondary | ICD-10-CM | POA: Insufficient documentation

## 2013-07-11 DIAGNOSIS — I517 Cardiomegaly: Secondary | ICD-10-CM | POA: Insufficient documentation

## 2013-10-19 ENCOUNTER — Ambulatory Visit
Admission: RE | Admit: 2013-10-19 | Discharge: 2013-10-19 | Disposition: A | Discharge status: Home / Self Care | Payer: Medicare Other | Source: Ambulatory Visit | Attending: Family Medicine | Admitting: Family Medicine

## 2013-10-19 ENCOUNTER — Other Ambulatory Visit: Payer: Self-pay | Admitting: Family Medicine

## 2013-10-19 DIAGNOSIS — R42 Dizziness and giddiness: Secondary | ICD-10-CM

## 2014-01-13 ENCOUNTER — Inpatient Hospital Stay (HOSPITAL_COMMUNITY)
Admission: EM | Admit: 2014-01-13 | Discharge: 2014-01-14 | DRG: 379 | Disposition: A | Discharge status: Home / Self Care | Payer: Medicare Other | Attending: Internal Medicine | Admitting: Internal Medicine

## 2014-01-13 ENCOUNTER — Encounter (HOSPITAL_COMMUNITY): Payer: Self-pay | Admitting: Emergency Medicine

## 2014-01-13 DIAGNOSIS — Z79899 Other long term (current) drug therapy: Secondary | ICD-10-CM

## 2014-01-13 DIAGNOSIS — Z820 Family history of epilepsy and other diseases of the nervous system: Secondary | ICD-10-CM

## 2014-01-13 DIAGNOSIS — I509 Heart failure, unspecified: Secondary | ICD-10-CM

## 2014-01-13 DIAGNOSIS — I2510 Atherosclerotic heart disease of native coronary artery without angina pectoris: Secondary | ICD-10-CM | POA: Diagnosis present

## 2014-01-13 DIAGNOSIS — I10 Essential (primary) hypertension: Secondary | ICD-10-CM | POA: Diagnosis present

## 2014-01-13 DIAGNOSIS — I252 Old myocardial infarction: Secondary | ICD-10-CM

## 2014-01-13 DIAGNOSIS — I5033 Acute on chronic diastolic (congestive) heart failure: Secondary | ICD-10-CM

## 2014-01-13 DIAGNOSIS — K922 Gastrointestinal hemorrhage, unspecified: Secondary | ICD-10-CM | POA: Diagnosis present

## 2014-01-13 DIAGNOSIS — I4891 Unspecified atrial fibrillation: Secondary | ICD-10-CM | POA: Diagnosis present

## 2014-01-13 DIAGNOSIS — E7800 Pure hypercholesterolemia, unspecified: Secondary | ICD-10-CM | POA: Diagnosis present

## 2014-01-13 DIAGNOSIS — J449 Chronic obstructive pulmonary disease, unspecified: Secondary | ICD-10-CM | POA: Diagnosis present

## 2014-01-13 DIAGNOSIS — K5790 Diverticulosis of intestine, part unspecified, without perforation or abscess without bleeding: Secondary | ICD-10-CM

## 2014-01-13 DIAGNOSIS — E785 Hyperlipidemia, unspecified: Secondary | ICD-10-CM | POA: Diagnosis present

## 2014-01-13 DIAGNOSIS — Z87891 Personal history of nicotine dependence: Secondary | ICD-10-CM

## 2014-01-13 DIAGNOSIS — J4489 Other specified chronic obstructive pulmonary disease: Secondary | ICD-10-CM | POA: Diagnosis present

## 2014-01-13 DIAGNOSIS — I482 Chronic atrial fibrillation, unspecified: Secondary | ICD-10-CM | POA: Diagnosis present

## 2014-01-13 DIAGNOSIS — Z7982 Long term (current) use of aspirin: Secondary | ICD-10-CM

## 2014-01-13 DIAGNOSIS — K5730 Diverticulosis of large intestine without perforation or abscess without bleeding: Secondary | ICD-10-CM

## 2014-01-13 DIAGNOSIS — Z951 Presence of aortocoronary bypass graft: Secondary | ICD-10-CM

## 2014-01-13 DIAGNOSIS — N2889 Other specified disorders of kidney and ureter: Secondary | ICD-10-CM

## 2014-01-13 DIAGNOSIS — Z8719 Personal history of other diseases of the digestive system: Secondary | ICD-10-CM

## 2014-01-13 DIAGNOSIS — K5731 Diverticulosis of large intestine without perforation or abscess with bleeding: Principal | ICD-10-CM | POA: Diagnosis present

## 2014-01-13 DIAGNOSIS — Z8249 Family history of ischemic heart disease and other diseases of the circulatory system: Secondary | ICD-10-CM

## 2014-01-13 DIAGNOSIS — E119 Type 2 diabetes mellitus without complications: Secondary | ICD-10-CM | POA: Diagnosis present

## 2014-01-13 DIAGNOSIS — K625 Hemorrhage of anus and rectum: Secondary | ICD-10-CM

## 2014-01-13 DIAGNOSIS — I4892 Unspecified atrial flutter: Secondary | ICD-10-CM

## 2014-01-13 LAB — COMPREHENSIVE METABOLIC PANEL
ALT: 19 U/L (ref 0–53)
ALT: 19 U/L (ref 0–53)
AST: 17 U/L (ref 0–37)
AST: 16 U/L (ref 0–37)
Albumin: 3.9 g/dL (ref 3.5–5.2)
Albumin: 3.8 g/dL (ref 3.5–5.2)
Alkaline Phosphatase: 38 U/L — ABNORMAL LOW (ref 39–117)
Alkaline Phosphatase: 34 U/L — ABNORMAL LOW (ref 39–117)
BILIRUBIN TOTAL: 0.5 mg/dL (ref 0.3–1.2)
BUN: 28 mg/dL — ABNORMAL HIGH (ref 6–23)
BUN: 26 mg/dL — ABNORMAL HIGH (ref 6–23)
CHLORIDE: 98 meq/L (ref 96–112)
CO2: 22 mEq/L (ref 19–32)
CO2: 27 mEq/L (ref 19–32)
Calcium: 9.5 mg/dL (ref 8.4–10.5)
Calcium: 9.5 mg/dL (ref 8.4–10.5)
Chloride: 98 mEq/L (ref 96–112)
Creatinine, Ser: 1.05 mg/dL (ref 0.50–1.35)
Creatinine, Ser: 1.12 mg/dL (ref 0.50–1.35)
GFR calc Af Amer: 78 mL/min — ABNORMAL LOW (ref >90)
GFR calc Af Amer: 72 mL/min — ABNORMAL LOW (ref >90)
GFR, EST NON AFRICAN AMERICAN: 67 mL/min — AB (ref >90)
GFR, EST NON AFRICAN AMERICAN: 62 mL/min — AB (ref >90)
Glucose, Bld: 126 mg/dL — ABNORMAL HIGH (ref 70–99)
Glucose, Bld: 121 mg/dL — ABNORMAL HIGH (ref 70–99)
POTASSIUM: 4.4 meq/L (ref 3.7–5.3)
Potassium: 4.4 mEq/L (ref 3.7–5.3)
SODIUM: 135 meq/L — AB (ref 137–147)
SODIUM: 136 meq/L — AB (ref 137–147)
Total Bilirubin: 0.4 mg/dL (ref 0.3–1.2)
Total Protein: 7.6 g/dL (ref 6.0–8.3)
Total Protein: 7.5 g/dL (ref 6.0–8.3)

## 2014-01-13 LAB — CBC WITH DIFFERENTIAL/PLATELET
BASOS ABS: 0 10*3/uL (ref 0.0–0.1)
Basophils Relative: 0 % (ref 0–1)
Eosinophils Absolute: 0 10*3/uL (ref 0.0–0.7)
Eosinophils Relative: 0 % (ref 0–5)
HEMATOCRIT: 47.6 % (ref 39.0–52.0)
Hemoglobin: 16.8 g/dL (ref 13.0–17.0)
Lymphocytes Relative: 22 % (ref 12–46)
Lymphs Abs: 2.2 10*3/uL (ref 0.7–4.0)
MCH: 31.7 pg (ref 26.0–34.0)
MCHC: 35.3 g/dL (ref 30.0–36.0)
MCV: 89.8 fL (ref 78.0–100.0)
MONO ABS: 0.5 10*3/uL (ref 0.1–1.0)
Monocytes Relative: 5 % (ref 3–12)
Neutro Abs: 7.4 10*3/uL (ref 1.7–7.7)
Neutrophils Relative %: 73 % (ref 43–77)
PLATELETS: 116 10*3/uL — AB (ref 150–400)
RBC: 5.3 MIL/uL (ref 4.22–5.81)
RDW: 14.6 % (ref 11.5–15.5)
WBC: 10.2 10*3/uL (ref 4.0–10.5)

## 2014-01-13 LAB — PROTIME-INR
INR: 1.05 (ref 0.00–1.49)
Prothrombin Time: 13.5 seconds (ref 11.6–15.2)

## 2014-01-13 LAB — CBC
HCT: 46.9 % (ref 39.0–52.0)
Hemoglobin: 16.9 g/dL (ref 13.0–17.0)
MCH: 32.3 pg (ref 26.0–34.0)
MCHC: 36 g/dL (ref 30.0–36.0)
MCV: 89.5 fL (ref 78.0–100.0)
PLATELETS: 132 10*3/uL — AB (ref 150–400)
RBC: 5.24 MIL/uL (ref 4.22–5.81)
RDW: 14.6 % (ref 11.5–15.5)
WBC: 11.1 10*3/uL — ABNORMAL HIGH (ref 4.0–10.5)

## 2014-01-13 LAB — TYPE AND SCREEN
ABO/RH(D): O POS
ANTIBODY SCREEN: NEGATIVE

## 2014-01-13 LAB — PHOSPHORUS: Phosphorus: 3.6 mg/dL (ref 2.3–4.6)

## 2014-01-13 LAB — MAGNESIUM: Magnesium: 2.1 mg/dL (ref 1.5–2.5)

## 2014-01-13 LAB — POC OCCULT BLOOD, ED: FECAL OCCULT BLD: POSITIVE — AB

## 2014-01-13 MED ORDER — METFORMIN HCL 500 MG PO TABS
1000.0000 mg | ORAL_TABLET | Freq: Two times a day (BID) | ORAL | Status: DC
  Administered 2014-01-13 – 2014-01-14 (×2): 1000 mg via ORAL
  Filled 2014-01-13 (×4): qty 2

## 2014-01-13 MED ORDER — SIMVASTATIN 20 MG PO TABS: 20.0000 mg | ORAL_TABLET | Freq: Every day | ORAL | Status: DC

## 2014-01-13 MED ORDER — TRIAMCINOLONE ACETONIDE 0.1 % EX CREA
1.0000 "application " | TOPICAL_CREAM | Freq: Every day | CUTANEOUS | Status: DC
  Administered 2014-01-13 – 2014-01-14 (×2): 1 via TOPICAL
  Filled 2014-01-13: qty 15

## 2014-01-13 MED ORDER — METOPROLOL TARTRATE 25 MG PO TABS: 50.0000 mg | ORAL_TABLET | Freq: Two times a day (BID) | ORAL | Status: DC

## 2014-01-13 MED ORDER — PRAVASTATIN SODIUM 40 MG PO TABS
40.0000 mg | ORAL_TABLET | Freq: Every day | ORAL | Status: DC
  Administered 2014-01-13: 40 mg via ORAL
  Filled 2014-01-13 (×2): qty 1

## 2014-01-13 MED ORDER — SODIUM CHLORIDE 0.9 % IV SOLN: INTRAVENOUS | Status: DC

## 2014-01-13 MED ORDER — TRIAMTERENE-HCTZ 37.5-25 MG PO TABS
1.0000 | ORAL_TABLET | Freq: Every day | ORAL | Status: DC
  Administered 2014-01-14: 1 via ORAL
  Filled 2014-01-13: qty 1

## 2014-01-13 MED ORDER — METOPROLOL TARTRATE 50 MG PO TABS
50.0000 mg | ORAL_TABLET | Freq: Two times a day (BID) | ORAL | Status: DC
  Administered 2014-01-13 – 2014-01-14 (×2): 50 mg via ORAL
  Filled 2014-01-13 (×3): qty 1

## 2014-01-13 MED ORDER — FENOFIBRATE 160 MG PO TABS
160.0000 mg | ORAL_TABLET | Freq: Every day | ORAL | Status: DC
  Administered 2014-01-14: 160 mg via ORAL
  Filled 2014-01-13: qty 1

## 2014-01-13 MED ORDER — ONDANSETRON HCL 4 MG/2ML IJ SOLN: 4.0000 mg | Freq: Four times a day (QID) | INTRAMUSCULAR | Status: DC | PRN

## 2014-01-13 MED ORDER — OMEGA-3-ACID ETHYL ESTERS 1 G PO CAPS
1.0000 g | ORAL_CAPSULE | Freq: Every day | ORAL | Status: DC
  Administered 2014-01-14: 1 g via ORAL
  Filled 2014-01-13: qty 1

## 2014-01-13 MED ORDER — LISINOPRIL 2.5 MG PO TABS
2.5000 mg | ORAL_TABLET | Freq: Every day | ORAL | Status: DC
  Administered 2014-01-14: 2.5 mg via ORAL
  Filled 2014-01-13 (×2): qty 1

## 2014-01-13 MED ORDER — DILTIAZEM HCL ER 120 MG PO CP24
120.0000 mg | ORAL_CAPSULE | Freq: Every day | ORAL | Status: DC
  Administered 2014-01-13: 120 mg via ORAL
  Filled 2014-01-13 (×2): qty 1

## 2014-01-13 MED ORDER — SODIUM CHLORIDE 0.9 % IV SOLN
INTRAVENOUS | Status: AC
  Administered 2014-01-13: 15:00:00 via INTRAVENOUS

## 2014-01-13 MED ORDER — ONDANSETRON HCL 4 MG PO TABS: 4.0000 mg | ORAL_TABLET | Freq: Four times a day (QID) | ORAL | Status: DC | PRN

## 2014-01-13 MED ORDER — ACETAMINOPHEN 650 MG RE SUPP: 650.0000 mg | Freq: Four times a day (QID) | RECTAL | Status: DC | PRN

## 2014-01-13 MED ORDER — ACETAMINOPHEN 325 MG PO TABS: 650.0000 mg | ORAL_TABLET | Freq: Four times a day (QID) | ORAL | Status: DC | PRN

## 2014-01-13 MED ORDER — FLUTICASONE PROPIONATE 50 MCG/ACT NA SUSP
1.0000 | Freq: Every evening | NASAL | Status: DC
  Administered 2014-01-13: 1 via NASAL
  Filled 2014-01-13: qty 16

## 2014-01-13 NOTE — ED Provider Notes (Signed)
CSN: [id number]     Arrival date & time 01/13/14  1321 History   First MD Initiated Contact with Patient 01/13/14 1354     Chief Complaint  Patient presents with  . Rectal Bleeding     (Consider location/radiation/quality/duration/timing/severity/associated sxs/prior Treatment) Patient is a 73 y.o. male presenting with hematochezia. The history is provided by the patient.  Rectal Bleeding Associated symptoms: no abdominal pain, no fever, no light-headedness and no vomiting   pt with hx diverticula, c/o 3 episodes blood per rectum since last pm. Blood red, dark. No melena. Initially mixed w stool, now appears as if all blood. Moderate amount. Denies prior rectal pain, constipation or straining. No hx pud or upper gi bleed. Prior lower gib felt due to diverticula. On asa 81 mg, no other asa or nsaid use. No other anticoag use. Denies faintness or dizziness. No abd pain. No nvd. No fever or chills. No other abn bleeding or bruising.      Past Medical History  Diagnosis Date  . CAD (coronary artery disease)   . Acute non-ST segment elevation myocardial infarction   . Left renal mass   . Diverticulitis   . COPD (chronic obstructive pulmonary disease)   . HTN (hypertension)   . Myocardial infarction 06/19/2011  . Ulcer 1960'S  . Renal neoplasm     LEFT  . Blood transfusion   . Diverticulitis 1996  . Depression    Past Surgical History  Procedure Laterality Date  . Coronary artery bypass grafting x4  06/28/11    Dr Servando Snare  . Hernia repair surgery x4.    . Coronary artery bypass graft  06/2011    4 VESSELS  . Colon resection  2005    FOR DIVERTICULITIS  . Robot assisted laparoscopic nephrectomy  11/29/2011    Procedure: ROBOTIC ASSISTED LAPAROSCOPIC NEPHRECTOMY;  Surgeon: Dutch Gray, MD;  Location: [facility];  Service: Urology;  Laterality: Left;  PARTIAL left nephrectomy    . Cardioversion  01/11/2012    Procedure: CARDIOVERSION;  Surgeon: Laverda Page, MD;  Location: [facility];   Service: Cardiovascular;  Laterality: N/A;  . Colonoscopy  02/02/2012    Procedure: COLONOSCOPY;  Surgeon: Jeryl Columbia, MD;  Location: [facility];  Service: Endoscopy;  Laterality: N/A;  magod /ja   Family History  Problem Relation Age of Onset  . Heart disease Father   . Alzheimer's disease Mother   . Alcohol abuse Brother    History  Substance Use Topics  . Smoking status: Former Smoker    Types: Cigarettes    Quit date: 06/19/2011  . Smokeless tobacco: Never Used  . Alcohol Use: No    Review of Systems  Constitutional: Negative for fever.  HENT: Negative for sore throat.   Eyes: Negative for redness.  Respiratory: Negative for shortness of breath.   Cardiovascular: Negative for chest pain.  Gastrointestinal: Positive for blood in stool and hematochezia. Negative for vomiting, abdominal pain and diarrhea.  Genitourinary: Negative for flank pain.  Musculoskeletal: Negative for back pain and neck pain.  Skin: Negative for rash.  Neurological: Negative for syncope, light-headedness and headaches.  Hematological: Does not bruise/bleed easily.  Psychiatric/Behavioral: Negative for confusion.      Allergies  Review of patient's allergies indicates no known allergies.  Home Medications   Prior to Admission medications   Medication Sig Start Date End Date Taking? Authorizing Provider  diltiazem (DILACOR XR) 120 MG 24 hr capsule Take 120 mg by mouth at bedtime.  Yes Historical Provider, MD  fenofibrate (TRICOR) 145 MG tablet Take 145 mg by mouth daily after breakfast.    Yes Historical Provider, MD  fluticasone (FLONASE) 50 MCG/ACT nasal spray Place 1 spray into both nostrils every evening.  01/01/14  Yes Historical Provider, MD  lisinopril (PRINIVIL,ZESTRIL) 2.5 MG tablet Take 2.5 mg by mouth daily after breakfast.    Yes Historical Provider, MD  metFORMIN (GLUCOPHAGE) 1000 MG tablet Take 1,000 mg by mouth 2 (two) times daily with a meal.   Yes Historical Provider, MD   metoprolol (LOPRESSOR) 50 MG tablet Take 50 mg by mouth 2 (two) times daily.  12/27/13  Yes Historical Provider, MD  nitroGLYCERIN (NITROSTAT) 0.4 MG SL tablet Place 0.4 mg under the tongue every 5 (five) minutes x 3 doses as needed for chest pain. For chest pain   Yes Historical Provider, MD  Omega-3 Fatty Acids (FISH OIL) 1000 MG CAPS Take 2,000 mg by mouth every morning.   Yes Historical Provider, MD  pravastatin (PRAVACHOL) 40 MG tablet Take 40 mg by mouth at bedtime.  11/27/13  Yes Historical Provider, MD  traMADol (ULTRAM) 50 MG tablet Take 50 mg by mouth every 8 (eight) hours as needed for severe pain.  01/02/14  Yes Historical Provider, MD  triamcinolone cream (KENALOG) 0.1 % Apply 1 application topically 2 (two) times daily.  12/18/13  Yes Historical Provider, MD   BP 143/77  Pulse 93  Temp(Src) 97.7 F (36.5 C) (Oral)  Resp 18  SpO2 97% Physical Exam  Nursing note and vitals reviewed. Constitutional: He is oriented to person, place, and time. He appears well-developed and well-nourished. No distress.  HENT:  Mouth/Throat: Oropharynx is clear and moist.  Eyes: Conjunctivae are normal. No scleral icterus.  Neck: Neck supple. No tracheal deviation present.  Cardiovascular: Normal rate, regular rhythm, normal heart sounds and intact distal pulses.   Pulmonary/Chest: Effort normal and breath sounds normal. No accessory muscle usage. No respiratory distress.  Abdominal: Soft. Bowel sounds are normal. He exhibits no distension and no mass. There is no tenderness. There is no rebound and no guarding.  Genitourinary:  Rectal v dark stool, heme pos.   Musculoskeletal: Normal range of motion. He exhibits no edema and no tenderness.  Neurological: He is alert and oriented to person, place, and time.  Skin: Skin is warm and dry.  Psychiatric: He has a normal mood and affect.    ED Course  Procedures (including critical care time)   Results for orders placed during the hospital encounter  of 01/13/14  CBC      Result Value Ref Range   WBC 11.1 (*) 4.0 - 10.5 K/uL   RBC 5.24  4.22 - 5.81 MIL/uL   Hemoglobin 16.9  13.0 - 17.0 g/dL   HCT 46.9  39.0 - 52.0 %   MCV 89.5  78.0 - 100.0 fL   MCH 32.3  26.0 - 34.0 pg   MCHC 36.0  30.0 - 36.0 g/dL   RDW 14.6  11.5 - 15.5 %   Platelets 132 (*) 150 - 400 K/uL  COMPREHENSIVE METABOLIC PANEL      Result Value Ref Range   Sodium 135 (*) 137 - 147 mEq/L   Potassium 4.4  3.7 - 5.3 mEq/L   Chloride 98  96 - 112 mEq/L   CO2 22  19 - 32 mEq/L   Glucose, Bld 126 (*) 70 - 99 mg/dL   BUN 28 (*) 6 - 23 mg/dL   Creatinine,  Ser 1.05  0.50 - 1.35 mg/dL   Calcium 9.5  8.4 - 10.5 mg/dL   Total Protein 7.6  6.0 - 8.3 g/dL   Albumin 3.9  3.5 - 5.2 g/dL   AST 17  0 - 37 U/L   ALT 19  0 - 53 U/L   Alkaline Phosphatase 38 (*) 39 - 117 U/L   Total Bilirubin 0.4  0.3 - 1.2 mg/dL   GFR calc non Af Amer 67 (*) >90 mL/min   GFR calc Af Amer 78 (*) >90 mL/min  PROTIME-INR      Result Value Ref Range   Prothrombin Time 13.5  11.6 - 15.2 seconds   INR 1.05  0.00 - 1.49  POC OCCULT BLOOD, ED      Result Value Ref Range   Fecal Occult Bld POSITIVE (*) NEGATIVE  TYPE AND SCREEN      Result Value Ref Range   ABO/RH(D) O POS     Antibody Screen PENDING     Sample Expiration 01/16/2014       MDM   Iv ns. Labs.  Reviewed nursing notes and prior charts for additional history.   Stool heme positive, pt reporting several episodes blood per rectum. Hx diverticula.  Suspect probable diverticular bleeding.  Will admit for observation, monitoring blood count.     Mirna Mires, MD 01/13/14 [phone]

## 2014-01-13 NOTE — ED Notes (Signed)
Pt from home c/o bright red blood from  Rectum that began last pm. He has had two episodes today that "pure blood".  When this happen before (2000) it was diverticulitis. Denies weakness. Has an episode of right sided pain but has since resolved.

## 2014-01-13 NOTE — H&P (Signed)
[facility] History and Physical  [name] RDE:[id number] DOB: [birth year] DOA: 01/13/2014  Referring physician: ED physician PCP: Jeryl Columbia, MD   Chief Complaint: rectal bleed  HPI:  73 year old male with past medical history of hypertension, diabetes, dyslipidemia, atrial fibrillation (on aspirin), diverticular bleed who presented to [facility] ED 01/13/2014 with complaints of blood per rectum started last night prior to this admission. Patient reported having blood with bowel movement initially but then even with no bowel movement. No associated diarrhea. No associated fever or chills. No abdominal pain, nausea or vomiting. No chest pain, no shortness of breath, no palpitations. No lightheadedness or loss of consciousness. No falls. No GU complaints. In ED, vitals are stable with BP of 121/67, HR 73, T max 97.7 F and oxygen saturation of 97% on room air. Blood work revealed WBC count of 11.1, normal hemoglobin and normal renal function. He was admitted for further evaluation of GI bleed.  Assessment and Plan:  Principal Problem:   GI bleed, lower  - likely lower GI bleed, likely diverticular - hold aspirin - hemoglobin is WNL on the admission - no current indications for transfusion Active Problems:   CAD (coronary artery disease) - hold aspirin in the setting of bleed   Essential hypertension, benign - may continue lisinopril , metoprolol, diltiazem, maxzide   Pure hypercholesterolemia - continue omega 3 and fenofibrate, simvastatin    Diabetes - continue metformin    Radiological Exams on Admission: No results found.   Code Status: Full Family Communication: Pt at bedside Disposition Plan: Admit for further evaluation     Review of Systems:  Constitutional: Negative for fever, chills and malaise/fatigue. Negative for diaphoresis.  HENT: Negative for hearing loss, ear pain, nosebleeds, congestion, sore throat, neck pain, tinnitus and ear discharge.   Eyes: Negative  for blurred vision, double vision, photophobia, pain, discharge and redness.  Respiratory: Negative for cough, hemoptysis, sputum production, shortness of breath, wheezing and stridor.   Cardiovascular: Negative for chest pain, palpitations, orthopnea, claudication and leg swelling.  Gastrointestinal: per HPI  Genitourinary: Negative for dysuria, urgency, frequency, hematuria and flank pain.  Musculoskeletal: Negative for myalgias, back pain, joint pain and falls.  Skin: Negative for itching and rash.  Neurological: Negative for dizziness and weakness. Negative for tingling, tremors, sensory change, speech change, focal weakness, loss of consciousness and headaches.  Endo/Heme/Allergies: Negative for environmental allergies and polydipsia. Does not bruise/bleed easily.  Psychiatric/Behavioral: Negative for suicidal ideas. The patient is not nervous/anxious.      Past Medical History  Diagnosis Date  . CAD (coronary artery disease)   . Acute non-ST segment elevation myocardial infarction   . Left renal mass   . Diverticulitis   . COPD (chronic obstructive pulmonary disease)   . HTN (hypertension)   . Myocardial infarction 06/19/2011  . Ulcer 1960'S  . Renal neoplasm     LEFT  . Blood transfusion   . Diverticulitis 1996  . Depression     Past Surgical History  Procedure Laterality Date  . Coronary artery bypass grafting x4  06/28/11    Dr Servando Snare  . Hernia repair surgery x4.    . Coronary artery bypass graft  06/2011    4 VESSELS  . Colon resection  2005    FOR DIVERTICULITIS  . Robot assisted laparoscopic nephrectomy  11/29/2011    Procedure: ROBOTIC ASSISTED LAPAROSCOPIC NEPHRECTOMY;  Surgeon: Dutch Gray, MD;  Location: [facility];  Service: Urology;  Laterality: Left;  PARTIAL left nephrectomy    .  Cardioversion  01/11/2012    Procedure: CARDIOVERSION;  Surgeon: Laverda Page, MD;  Location: [facility];  Service: Cardiovascular;  Laterality: N/A;  . Colonoscopy  02/02/2012     Procedure: COLONOSCOPY;  Surgeon: Jeryl Columbia, MD;  Location[facility];  Service: Endoscopy;  Laterality: N/A;  magod Greggory Brandy    Social History:  reports that he quit smoking about 2 years ago. His smoking use included Cigarettes. He smoked 0.00 packs per day. He has never used smokeless tobacco. He reports that he does not drink alcohol or use illicit drugs.  No Known Allergies  Family History  Problem Relation Age of Onset  . Heart disease Father   . Alzheimer's disease Mother   . Alcohol abuse Brother     Prior to Admission medications   Medication Sig Start Date End Date Taking? Authorizing Provider  aspirin EC 81 MG tablet Take 81 mg by mouth 2 (two) times daily.   Yes Historical Provider, MD  diltiazem (DILACOR XR) 120 MG 24 hr capsule Take 120 mg by mouth at bedtime.   Yes Historical Provider, MD  fenofibrate (TRICOR) 145 MG tablet Take 145 mg by mouth daily after breakfast.    Yes Historical Provider, MD  fluticasone (FLONASE) 50 MCG/ACT nasal spray Place 1 spray into both nostrils every evening.  01/01/14  Yes Historical Provider, MD  lisinopril (PRINIVIL,ZESTRIL) 2.5 MG tablet Take 2.5 mg by mouth daily after breakfast.    Yes Historical Provider, MD  metFORMIN (GLUCOPHAGE) 1000 MG tablet Take 1,000 mg by mouth 2 (two) times daily with a meal.   Yes Historical Provider, MD  metoprolol (LOPRESSOR) 50 MG tablet Take 50 mg by mouth 2 (two) times daily.  12/27/13  Yes Historical Provider, MD  nitroGLYCERIN (NITROSTAT) 0.4 MG SL tablet Place 0.4 mg under the tongue every 5 (five) minutes x 3 doses as needed for chest pain. For chest pain   Yes Historical Provider, MD  Omega-3 Fatty Acids (FISH OIL) 1000 MG CAPS Take 2,000 mg by mouth every morning.   Yes Historical Provider, MD  pravastatin (PRAVACHOL) 40 MG tablet Take 40 mg by mouth at bedtime.  11/27/13  Yes Historical Provider, MD  traMADol (ULTRAM) 50 MG tablet Take 50 mg by mouth every 8 (eight) hours as needed for severe pain.   01/02/14  Yes Historical Provider, MD  triamcinolone cream (KENALOG) 0.1 % Apply 1 application topically daily. APPLY TO FEET 12/18/13  Yes Historical Provider, MD  triamterene-hydrochlorothiazide (MAXZIDE-25) 37.5-25 MG per tablet Take 1 tablet by mouth daily.   Yes Historical Provider, MD    Physical Exam: Filed Vitals:   01/13/14 1337 01/13/14 1402 01/13/14 1404 01/13/14 1405  BP: 121/67 123/61 139/74 143/77  Pulse: 73 79 89 93  Temp: 97.7 F (36.5 C)     TempSrc: Oral     Resp: 18     SpO2: 97%       Physical Exam  Constitutional: Appears well-developed and well-nourished. No distress.  HENT: Normocephalic. External right and left ear normal. Oropharynx is clear and moist.  Eyes: Conjunctivae and EOM are normal. PERRLA, no scleral icterus.  Neck: Normal ROM. Neck supple. No JVD. No tracheal deviation. No thyromegaly.  CVS: RRR, S1/S2 +, no murmurs, no gallops, no carotid bruit.  Pulmonary: Effort and breath sounds normal, no stridor, rhonchi, wheezes, rales.  Abdominal: Soft. BS +,  no distension, tenderness, rebound or guarding.  Musculoskeletal: Normal range of motion. No edema and no tenderness.  Lymphadenopathy: No  lymphadenopathy noted, cervical, inguinal. Neuro: Alert. Normal reflexes, muscle tone coordination. No cranial nerve deficit. Skin: Skin is warm and dry. No rash noted. Not diaphoretic. No erythema. No pallor.  Psychiatric: Normal mood and affect. Behavior, judgment, thought content normal.   Labs on Admission:  Basic Metabolic Panel:  Recent Labs Lab 01/13/14 1355  NA 135*  K 4.4  CL 98  CO2 22  GLUCOSE 126*  BUN 28*  CREATININE 1.05  CALCIUM 9.5   Liver Function Tests:  Recent Labs Lab 01/13/14 1355  AST 17  ALT 19  ALKPHOS 38*  BILITOT 0.4  PROT 7.6  ALBUMIN 3.9   No results found for this basename: LIPASE, AMYLASE,  in the last 168 hours No results found for this basename: AMMONIA,  in the last 168 hours CBC:  Recent Labs Lab  01/13/14 1355  WBC 11.1*  HGB 16.9  HCT 46.9  MCV 89.5  PLT 132*   Cardiac Enzymes: No results found for this basename: CKTOTAL, CKMB, CKMBINDEX, TROPONINI,  in the last 168 hours BNP: No components found with this basename: POCBNP,  CBG: No results found for this basename: GLUCAP,  in the last 168 hours    Theodis Blaze, MD  [facility] Pager [phone]  If 7PM-7AM, please contact night-coverage [email or web address] Password [email or web address] 01/13/2014, 2:53 PM

## 2014-01-14 DIAGNOSIS — I2510 Atherosclerotic heart disease of native coronary artery without angina pectoris: Secondary | ICD-10-CM

## 2014-01-14 LAB — CBC
HCT: 47.5 % (ref 39.0–52.0)
HEMOGLOBIN: 16.6 g/dL (ref 13.0–17.0)
MCH: 31.6 pg (ref 26.0–34.0)
MCHC: 34.9 g/dL (ref 30.0–36.0)
MCV: 90.5 fL (ref 78.0–100.0)
PLATELETS: 118 10*3/uL — AB (ref 150–400)
RBC: 5.25 MIL/uL (ref 4.22–5.81)
RDW: 14.7 % (ref 11.5–15.5)
WBC: 10.3 10*3/uL (ref 4.0–10.5)

## 2014-01-14 LAB — COMPREHENSIVE METABOLIC PANEL
ALT: 19 U/L (ref 0–53)
AST: 19 U/L (ref 0–37)
Albumin: 3.7 g/dL (ref 3.5–5.2)
Alkaline Phosphatase: 33 U/L — ABNORMAL LOW (ref 39–117)
BILIRUBIN TOTAL: 0.5 mg/dL (ref 0.3–1.2)
BUN: 25 mg/dL — ABNORMAL HIGH (ref 6–23)
CALCIUM: 8.8 mg/dL (ref 8.4–10.5)
CHLORIDE: 99 meq/L (ref 96–112)
CO2: 25 meq/L (ref 19–32)
CREATININE: 1.03 mg/dL (ref 0.50–1.35)
GFR, EST AFRICAN AMERICAN: 80 mL/min — AB (ref >90)
GFR, EST NON AFRICAN AMERICAN: 69 mL/min — AB (ref >90)
GLUCOSE: 112 mg/dL — AB (ref 70–99)
Potassium: 4 mEq/L (ref 3.7–5.3)
Sodium: 137 mEq/L (ref 137–147)
Total Protein: 7.2 g/dL (ref 6.0–8.3)

## 2014-01-14 LAB — GLUCOSE, CAPILLARY: Glucose-Capillary: 125 mg/dL — ABNORMAL HIGH (ref 70–99)

## 2014-01-14 NOTE — Progress Notes (Signed)
Patient given discharge instructions, and verbalized an understanding of all discharge instructions.  Patient agrees with discharge plan, and is being discharged in stable medical condition.  Patient given transportation via wheelchair.  [name] RN 

## 2014-01-14 NOTE — Discharge Summary (Signed)
Physician Discharge Summary  [name]:[id number] DOB: [birth year] DOA: 01/13/2014  PCP: Jeryl Columbia, MD  Admit date: 01/13/2014 Discharge date: 01/14/2014  Recommendations for Outpatient Follow-up:  1. Pt will need to follow up with PCP in 2-3 weeks post discharge 2. Please obtain BMP to evaluate electrolytes and kidney function 3. Please also check CBC to evaluate Hg and Hct levels 4. Pt advised to follow up with Dr. Watt Climes in several weeks or sooner if bleeding recurs   Discharge Diagnoses: GI bleed  Principal Problem:   GI bleed Active Problems:   CAD (coronary artery disease)   Essential hypertension, benign   Pure hypercholesterolemia   Chronic a-fib   Diabetes  Discharge Condition: Stable  Diet recommendation: Heart healthy diet discussed in details   History of present illness:  73 year old male with past medical history of hypertension, diabetes, dyslipidemia, atrial fibrillation (on aspirin), diverticular bleed who presented to [facility] ED 01/13/2014 with complaints of blood per rectum started last night prior to this admission. Patient reported having blood with bowel movement initially but then even with no bowel movement. No associated diarrhea. No associated fever or chills. No abdominal pain, nausea or vomiting. No chest pain, no shortness of breath, no palpitations. No lightheadedness or loss of consciousness. No falls. No GU complaints.   In ED, vitals are stable with BP of 121/67, HR 73, T max 97.7 F and oxygen saturation of 97% on room air. Blood work revealed WBC count of 11.1, normal hemoglobin and normal renal function. He was admitted for further evaluation of GI bleed.   Assessment and Plan:  Principal Problem:  GI bleed, lower  - likely lower GI bleed, likely diverticular as pt has history of diverticular bleed  - hemoglobin is WNL on the admission and is still WNL this AM - no current indications for transfusion  - pt denies any blood in stool this AM -  spoke with Dr. Cristina Gong to discuss if an outpatient follow up appropriate as pt is hemodynamically stable and has no blood in stool since admission  Active Problems:  CAD (coronary artery disease)  - ma resume aspirin upon discharge  Essential hypertension, benign  - may continue lisinopril , metoprolol, diltiazem, maxzide  Pure hypercholesterolemia  - continue omega 3 and fenofibrate, simvastatin  Diabetes  - continue metformin    Procedures/Studies:  None  Consultations:  None  Antibiotics:  None  Discharge Exam: Filed Vitals:   01/14/14 0527  BP: 145/89  Pulse: 71  Temp: 97.5 F (36.4 C)  Resp: 18   Filed Vitals:   01/13/14 1543 01/13/14 1556 01/13/14 2140 01/14/14 0527  BP:  136/75 156/95 145/89  Pulse:  83 89 71  Temp: 97.9 F (36.6 C) 97.6 F (36.4 C) 97.6 F (36.4 C) 97.5 F (36.4 C)  TempSrc: Oral Oral Oral Oral  Resp:  18 18 18   Height:  6' (1.829 m)    Weight:  90.8 kg (200 lb 2.8 oz)  89.9 kg (198 lb 3.1 oz)  SpO2: 97% 95% 95% 96%    General: Pt is alert, follows commands appropriately, not in acute distress Cardiovascular: Regular rate and rhythm, S1/S2 +, no murmurs, no rubs, no gallops Respiratory: Clear to auscultation bilaterally, no wheezing, no crackles, no rhonchi Abdominal: Soft, non tender, non distended, bowel sounds +, no guarding Extremities: no edema, no cyanosis, pulses palpable bilaterally DP and PT Neuro: Grossly nonfocal  Discharge Instructions     Medication List  aspirin EC 81 MG tablet  Take 81 mg by mouth 2 (two) times daily.     diltiazem 120 MG 24 hr capsule  Commonly known as:  DILACOR XR  Take 120 mg by mouth at bedtime.     fenofibrate 145 MG tablet  Commonly known as:  TRICOR  Take 145 mg by mouth daily after breakfast.     Fish Oil 1000 MG Caps  Take 2,000 mg by mouth every morning.     fluticasone 50 MCG/ACT nasal spray  Commonly known as:  FLONASE  Place 1 spray into both nostrils every  evening.     lisinopril 2.5 MG tablet  Commonly known as:  PRINIVIL,ZESTRIL  Take 2.5 mg by mouth daily after breakfast.     metFORMIN 1000 MG tablet  Commonly known as:  GLUCOPHAGE  Take 1,000 mg by mouth 2 (two) times daily with a meal.     metoprolol 50 MG tablet  Commonly known as:  LOPRESSOR  Take 50 mg by mouth 2 (two) times daily.     nitroGLYCERIN 0.4 MG SL tablet  Commonly known as:  NITROSTAT  Place 0.4 mg under the tongue every 5 (five) minutes x 3 doses as needed for chest pain. For chest pain     pravastatin 40 MG tablet  Commonly known as:  PRAVACHOL  Take 40 mg by mouth at bedtime.     traMADol 50 MG tablet  Commonly known as:  ULTRAM  Take 50 mg by mouth every 8 (eight) hours as needed for severe pain.     triamcinolone cream 0.1 %  Commonly known as:  KENALOG  Apply 1 application topically daily. APPLY TO FEET     triamterene-hydrochlorothiazide 37.5-25 MG per tablet  Commonly known as:  MAXZIDE-25  Take 1 tablet by mouth daily.           Follow-up Information   Schedule an appointment as soon as possible for a visit with [facility], MD.   Specialty:  Gastroenterology   Contact information:   [address], [place] [address] [phone]       Follow up with Faye Ramsay, MD. (As needed, If symptoms worsen, call my cell phone [phone])    Specialty:  Internal Medicine   Contact information:   [address]  [address] [phone]        The results of significant diagnostics from this hospitalization (including imaging, microbiology, ancillary and laboratory) are listed below for reference.     Microbiology: No results found for this or any previous visit (from the past 240 hour(s)).   Labs: Basic Metabolic Panel:  Recent Labs Lab 01/13/14 1355 01/13/14 1625 01/14/14 0432  NA 135* 136* 137  K 4.4 4.4 4.0  CL 98 98 99  CO2 22 27 25   GLUCOSE 126* 121* 112*  BUN 28* 26* 25*   CREATININE 1.05 1.12 1.03  CALCIUM 9.5 9.5 8.8  MG  --  2.1  --   PHOS  --  3.6  --    Liver Function Tests:  Recent Labs Lab 01/13/14 1355 01/13/14 1625 01/14/14 0432  AST 17 16 19   ALT 19 19 19   ALKPHOS 38* 34* 33*  BILITOT 0.4 0.5 0.5  PROT 7.6 7.5 7.2  ALBUMIN 3.9 3.8 3.7   CBC:  Recent Labs Lab 01/13/14 1355 01/13/14 1625 01/14/14 0432  WBC 11.1* 10.2 10.3  NEUTROABS  --  7.4  --   HGB 16.9 16.8  16.6  HCT 46.9 47.6 47.5  MCV 89.5 89.8 90.5  PLT 132* 116* 118*    SIGNED: Time coordinating discharge: Over 30 minutes  Theodis Blaze, MD  [facility] 01/14/2014, 10:53 AM Pager [phone]  If 7PM-7AM, please contact night-coverage [email or web address] Password [email or web address]

## 2014-01-14 NOTE — Discharge Instructions (Signed)
Rectal Bleeding Rectal bleeding is when blood passes out of the anus. It is usually a sign that something is wrong. It may not be serious, but it should always be evaluated. Rectal bleeding may present as bright red blood or extremely dark stools. The color may range from dark red or maroon to black (like tar). It is important that the cause of rectal bleeding be identified so treatment can be started and the problem corrected. CAUSES   Hemorrhoids. These are enlarged (dilated) blood vessels or veins in the anal or rectal area.  Fistulas. Theseare abnormal, burrowing channels that usually run from inside the rectum to the skin around the anus. They can bleed.  Anal fissures. This is a tear in the tissue of the anus. Bleeding occurs with bowel movements.  Diverticulosis. This is a condition in which pockets or sacs project from the bowel wall. Occasionally, the sacs can bleed.  Diverticulitis. Thisis an infection involving diverticulosis of the colon.  Proctitis and colitis. These are conditions in which the rectum, colon, or both, can become inflamed and pitted (ulcerated).  Polyps and cancer. Polyps are non-cancerous (benign) growths in the colon that may bleed. Certain types of polyps turn into cancer.  Protrusion of the rectum. Part of the rectum can project from the anus and bleed.  Certain medicines.  Intestinal infections.  Blood vessel abnormalities. HOME CARE INSTRUCTIONS  Eat a high-fiber diet to keep your stool soft.  Limit activity.  Drink enough fluids to keep your urine clear or pale yellow.  Warm baths may be useful to soothe rectal pain.  Follow up with your caregiver as directed. SEEK IMMEDIATE MEDICAL CARE IF:  You develop increased bleeding.  You have black or dark red stools.  You vomit blood or material that looks like coffee grounds.  You have abdominal pain or tenderness.  You have a fever.  You feel weak, nauseous, or you faint.  You have  severe rectal pain or you are unable to have a bowel movement. MAKE SURE YOU:  Understand these instructions.  Will watch your condition.  Will get help right away if you are not doing well or get worse. Document Released: 02/19/2002 Document Revised: 11/22/2011 Document Reviewed: 02/14/2011 ExitCare Patient Information 2014 [employer].  

## 2014-01-16 ENCOUNTER — Ambulatory Visit (HOSPITAL_COMMUNITY)
Admission: RE | Admit: 2014-01-16 | Discharge: 2014-01-16 | Disposition: A | Discharge status: Home / Self Care | Payer: Medicare Other | Source: Ambulatory Visit | Attending: Urology | Admitting: Urology

## 2014-01-16 ENCOUNTER — Other Ambulatory Visit (HOSPITAL_COMMUNITY): Payer: Self-pay | Admitting: Urology

## 2014-01-16 DIAGNOSIS — Z01811 Encounter for preprocedural respiratory examination: Secondary | ICD-10-CM | POA: Insufficient documentation

## 2014-01-16 DIAGNOSIS — C649 Malignant neoplasm of unspecified kidney, except renal pelvis: Secondary | ICD-10-CM

## 2014-02-05 ENCOUNTER — Ambulatory Visit (INDEPENDENT_AMBULATORY_CARE_PROVIDER_SITE_OTHER): Payer: Medicare Other

## 2014-02-05 DIAGNOSIS — E1142 Type 2 diabetes mellitus with diabetic polyneuropathy: Secondary | ICD-10-CM

## 2014-02-05 DIAGNOSIS — G622 Polyneuropathy due to other toxic agents: Secondary | ICD-10-CM

## 2014-02-05 DIAGNOSIS — R209 Unspecified disturbances of skin sensation: Secondary | ICD-10-CM

## 2014-02-05 DIAGNOSIS — G619 Inflammatory polyneuropathy, unspecified: Secondary | ICD-10-CM

## 2014-02-05 NOTE — Procedures (Signed)
     HISTORY:  [name] is a 73 year old gentleman with a history of diabetes with reports of numbness and cold sensations in the lower extremities. The patient is being evaluated for a possible peripheral neuropathy.  NERVE CONDUCTION STUDIES:  Nerve conduction studies were performed on the right upper extremity. The distal motor latency for the right median nerve was prolonged, with a normal motor amplitude. The distal motor latencies and motor amplitudes for the right ulnar nerve were normal. The F wave latency for the right median nerve was borderline normal, normal for the right ulnar nerve. The nerve conduction velocities for the median and ulnar nerves on the right were normal. The sensory latency for the right median nerve was prolonged, with a normal ulnar sensory latency.  Nerve conduction studies were performed on both lower extremities. The studies of the peroneal nerves and posterior tibial nerves were unobtainable bilaterally. The peroneal sensory latencies were unobtainable bilaterally. The H reflex latencies were unobtainable bilaterally.  EMG STUDIES:  EMG evaluation was not performed.  IMPRESSION:  Nerve conduction studies done on the right upper extremity and both lower extremities shows evidence of a mild right carpal tunnel syndrome. 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 Clinical correlation is required.  Jill Alexanders MD 02/05/2014 10:42 AM  [facility] [address] [place], [place] [address]  Phone [phone] Fax [phone]

## 2014-08-26 ENCOUNTER — Other Ambulatory Visit: Payer: Self-pay | Admitting: Family Medicine

## 2014-08-26 ENCOUNTER — Ambulatory Visit
Admission: RE | Admit: 2014-08-26 | Discharge: 2014-08-26 | Disposition: A | Discharge status: Home / Self Care | Payer: Medicare Other | Source: Ambulatory Visit | Attending: Family Medicine | Admitting: Family Medicine

## 2014-08-26 DIAGNOSIS — J22 Unspecified acute lower respiratory infection: Secondary | ICD-10-CM

## 2014-08-29 ENCOUNTER — Observation Stay (HOSPITAL_COMMUNITY): Payer: Medicare Other

## 2014-08-29 ENCOUNTER — Encounter (HOSPITAL_COMMUNITY): Payer: Self-pay | Admitting: Emergency Medicine

## 2014-08-29 ENCOUNTER — Encounter: Payer: Self-pay | Admitting: Diagnostic Neuroimaging

## 2014-08-29 ENCOUNTER — Ambulatory Visit (INDEPENDENT_AMBULATORY_CARE_PROVIDER_SITE_OTHER): Payer: Medicare Other | Admitting: Diagnostic Neuroimaging

## 2014-08-29 ENCOUNTER — Observation Stay (HOSPITAL_COMMUNITY)
Admission: EM | Admit: 2014-08-29 | Discharge: 2014-08-30 | Disposition: A | Discharge status: Home / Self Care | Payer: Medicare Other | Attending: Internal Medicine | Admitting: Internal Medicine

## 2014-08-29 VITALS — BP 129/87 | HR 82 | Temp 98.0°F | Ht 72.0 in | Wt 217.0 lb

## 2014-08-29 DIAGNOSIS — G253 Myoclonus: Secondary | ICD-10-CM

## 2014-08-29 DIAGNOSIS — R278 Other lack of coordination: Secondary | ICD-10-CM

## 2014-08-29 DIAGNOSIS — R279 Unspecified lack of coordination: Secondary | ICD-10-CM

## 2014-08-29 DIAGNOSIS — I252 Old myocardial infarction: Secondary | ICD-10-CM | POA: Insufficient documentation

## 2014-08-29 DIAGNOSIS — R251 Tremor, unspecified: Secondary | ICD-10-CM | POA: Diagnosis present

## 2014-08-29 DIAGNOSIS — E119 Type 2 diabetes mellitus without complications: Secondary | ICD-10-CM | POA: Diagnosis not present

## 2014-08-29 DIAGNOSIS — M2578 Osteophyte, vertebrae: Secondary | ICD-10-CM | POA: Insufficient documentation

## 2014-08-29 DIAGNOSIS — Z87891 Personal history of nicotine dependence: Secondary | ICD-10-CM | POA: Diagnosis not present

## 2014-08-29 DIAGNOSIS — E7800 Pure hypercholesterolemia, unspecified: Secondary | ICD-10-CM | POA: Diagnosis present

## 2014-08-29 DIAGNOSIS — J441 Chronic obstructive pulmonary disease with (acute) exacerbation: Secondary | ICD-10-CM | POA: Diagnosis not present

## 2014-08-29 DIAGNOSIS — I10 Essential (primary) hypertension: Secondary | ICD-10-CM | POA: Diagnosis not present

## 2014-08-29 DIAGNOSIS — I509 Heart failure, unspecified: Secondary | ICD-10-CM | POA: Diagnosis not present

## 2014-08-29 DIAGNOSIS — I4892 Unspecified atrial flutter: Secondary | ICD-10-CM | POA: Insufficient documentation

## 2014-08-29 DIAGNOSIS — Z951 Presence of aortocoronary bypass graft: Secondary | ICD-10-CM | POA: Diagnosis not present

## 2014-08-29 DIAGNOSIS — I482 Chronic atrial fibrillation: Secondary | ICD-10-CM | POA: Insufficient documentation

## 2014-08-29 DIAGNOSIS — I2510 Atherosclerotic heart disease of native coronary artery without angina pectoris: Secondary | ICD-10-CM | POA: Insufficient documentation

## 2014-08-29 DIAGNOSIS — F329 Major depressive disorder, single episode, unspecified: Secondary | ICD-10-CM | POA: Insufficient documentation

## 2014-08-29 DIAGNOSIS — M47892 Other spondylosis, cervical region: Secondary | ICD-10-CM | POA: Diagnosis not present

## 2014-08-29 LAB — COMPREHENSIVE METABOLIC PANEL
ALBUMIN: 3.9 g/dL (ref 3.5–5.2)
ALT: 16 U/L (ref 0–53)
AST: 27 U/L (ref 0–37)
Alkaline Phosphatase: 36 U/L — ABNORMAL LOW (ref 39–117)
Anion gap: 18 — ABNORMAL HIGH (ref 5–15)
BUN: 16 mg/dL (ref 6–23)
CALCIUM: 9.4 mg/dL (ref 8.4–10.5)
CO2: 21 meq/L (ref 19–32)
CREATININE: 0.98 mg/dL (ref 0.50–1.35)
Chloride: 96 mEq/L (ref 96–112)
GFR calc Af Amer: >90 mL/min (ref >90)
GFR, EST NON AFRICAN AMERICAN: 78 mL/min — AB (ref >90)
Glucose, Bld: 102 mg/dL — ABNORMAL HIGH (ref 70–99)
Potassium: 3.9 mEq/L (ref 3.7–5.3)
Sodium: 135 mEq/L — ABNORMAL LOW (ref 137–147)
Total Bilirubin: 0.6 mg/dL (ref 0.3–1.2)
Total Protein: 7.8 g/dL (ref 6.0–8.3)

## 2014-08-29 LAB — CBC
HEMATOCRIT: 45.5 % (ref 39.0–52.0)
Hemoglobin: 15.6 g/dL (ref 13.0–17.0)
MCH: 29.9 pg (ref 26.0–34.0)
MCHC: 34.3 g/dL (ref 30.0–36.0)
MCV: 87.3 fL (ref 78.0–100.0)
PLATELETS: 136 10*3/uL — AB (ref 150–400)
RBC: 5.21 MIL/uL (ref 4.22–5.81)
RDW: 15.5 % (ref 11.5–15.5)
WBC: 6.7 10*3/uL (ref 4.0–10.5)

## 2014-08-29 LAB — URINALYSIS, ROUTINE W REFLEX MICROSCOPIC
BILIRUBIN URINE: NEGATIVE
Glucose, UA: NEGATIVE mg/dL
Hgb urine dipstick: NEGATIVE
KETONES UR: NEGATIVE mg/dL
Leukocytes, UA: NEGATIVE
NITRITE: NEGATIVE
PH: 5.5 (ref 5.0–8.0)
Protein, ur: NEGATIVE mg/dL
Specific Gravity, Urine: 1.008 (ref 1.005–1.030)
Urobilinogen, UA: 0.2 mg/dL (ref 0.0–1.0)

## 2014-08-29 LAB — PRO B NATRIURETIC PEPTIDE
PRO B NATRI PEPTIDE: 1294 pg/mL — AB (ref 0–450)
Pro B Natriuretic peptide (BNP): 1356 pg/mL — ABNORMAL HIGH (ref 0–450)

## 2014-08-29 LAB — HEPATIC FUNCTION PANEL
ALK PHOS: 39 U/L (ref 39–117)
ALT: 17 U/L (ref 0–53)
AST: 24 U/L (ref 0–37)
Albumin: 4.2 g/dL (ref 3.5–5.2)
BILIRUBIN TOTAL: 0.6 mg/dL (ref 0.3–1.2)
Total Protein: 8.2 g/dL (ref 6.0–8.3)

## 2014-08-29 LAB — BASIC METABOLIC PANEL
Anion gap: 14 (ref 5–15)
BUN: 16 mg/dL (ref 6–23)
CO2: 26 mEq/L (ref 19–32)
CREATININE: 1.09 mg/dL (ref 0.50–1.35)
Calcium: 9.5 mg/dL (ref 8.4–10.5)
Chloride: 96 mEq/L (ref 96–112)
GFR, EST AFRICAN AMERICAN: 75 mL/min — AB (ref >90)
GFR, EST NON AFRICAN AMERICAN: 64 mL/min — AB (ref >90)
Glucose, Bld: 105 mg/dL — ABNORMAL HIGH (ref 70–99)
POTASSIUM: 3.9 meq/L (ref 3.7–5.3)
Sodium: 136 mEq/L — ABNORMAL LOW (ref 137–147)

## 2014-08-29 LAB — CBC WITH DIFFERENTIAL/PLATELET
BASOS ABS: 0 10*3/uL (ref 0.0–0.1)
BASOS PCT: 0 % (ref 0–1)
EOS ABS: 0 10*3/uL (ref 0.0–0.7)
EOS PCT: 0 % (ref 0–5)
HEMATOCRIT: 45.5 % (ref 39.0–52.0)
Hemoglobin: 15.7 g/dL (ref 13.0–17.0)
Lymphocytes Relative: 24 % (ref 12–46)
Lymphs Abs: 1.5 10*3/uL (ref 0.7–4.0)
MCH: 30.3 pg (ref 26.0–34.0)
MCHC: 34.5 g/dL (ref 30.0–36.0)
MCV: 87.7 fL (ref 78.0–100.0)
MONO ABS: 0.5 10*3/uL (ref 0.1–1.0)
Monocytes Relative: 7 % (ref 3–12)
Neutro Abs: 4.5 10*3/uL (ref 1.7–7.7)
Neutrophils Relative %: 69 % (ref 43–77)
Platelets: 130 10*3/uL — ABNORMAL LOW (ref 150–400)
RBC: 5.19 MIL/uL (ref 4.22–5.81)
RDW: 15.5 % (ref 11.5–15.5)
WBC: 6.5 10*3/uL (ref 4.0–10.5)

## 2014-08-29 LAB — GLUCOSE, CAPILLARY: GLUCOSE-CAPILLARY: 188 mg/dL — AB (ref 70–99)

## 2014-08-29 LAB — TSH
TSH: 1.4 u[IU]/mL (ref 0.350–4.500)
TSH: 1.69 u[IU]/mL (ref 0.350–4.500)
TSH: 0.993 u[IU]/mL (ref 0.350–4.500)

## 2014-08-29 LAB — I-STAT TROPONIN, ED
TROPONIN I, POC: 0 ng/mL (ref 0.00–0.08)
Troponin i, poc: 0.01 ng/mL (ref 0.00–0.08)

## 2014-08-29 LAB — AMMONIA: Ammonia: 53 umol/L (ref 11–60)

## 2014-08-29 LAB — MAGNESIUM: MAGNESIUM: 2 mg/dL (ref 1.5–2.5)

## 2014-08-29 LAB — CK: Total CK: 111 U/L (ref 7–232)

## 2014-08-29 MED ORDER — ONDANSETRON HCL 4 MG PO TABS: 4.0000 mg | ORAL_TABLET | Freq: Four times a day (QID) | ORAL | Status: DC | PRN

## 2014-08-29 MED ORDER — ENOXAPARIN SODIUM 40 MG/0.4ML ~~LOC~~ SOLN
40.0000 mg | SUBCUTANEOUS | Status: DC
  Administered 2014-08-29: 40 mg via SUBCUTANEOUS
  Filled 2014-08-29: qty 0.4

## 2014-08-29 MED ORDER — ACETAMINOPHEN 650 MG RE SUPP: 650.0000 mg | Freq: Four times a day (QID) | RECTAL | Status: DC | PRN

## 2014-08-29 MED ORDER — SODIUM CHLORIDE 0.9 % IJ SOLN
3.0000 mL | Freq: Two times a day (BID) | INTRAMUSCULAR | Status: DC
  Administered 2014-08-29: 3 mL via INTRAVENOUS

## 2014-08-29 MED ORDER — ONDANSETRON HCL 4 MG/2ML IJ SOLN: 4.0000 mg | Freq: Four times a day (QID) | INTRAMUSCULAR | Status: DC | PRN

## 2014-08-29 MED ORDER — IPRATROPIUM BROMIDE 0.02 % IN SOLN
0.5000 mg | RESPIRATORY_TRACT | Status: DC
  Administered 2014-08-29 – 2014-08-30 (×5): 0.5 mg via RESPIRATORY_TRACT
  Filled 2014-08-29 (×5): qty 2.5

## 2014-08-29 MED ORDER — ENOXAPARIN SODIUM 40 MG/0.4ML ~~LOC~~ SOLN: 40.0000 mg | SUBCUTANEOUS | Status: DC

## 2014-08-29 MED ORDER — LEVALBUTEROL HCL 0.63 MG/3ML IN NEBU
0.6300 mg | INHALATION_SOLUTION | RESPIRATORY_TRACT | Status: DC
  Administered 2014-08-29 – 2014-08-30 (×5): 0.63 mg via RESPIRATORY_TRACT
  Filled 2014-08-29 (×5): qty 3

## 2014-08-29 MED ORDER — HYDROMORPHONE HCL 1 MG/ML IJ SOLN: 1.0000 mg | INTRAMUSCULAR | Status: DC | PRN

## 2014-08-29 MED ORDER — FUROSEMIDE 10 MG/ML IJ SOLN
40.0000 mg | Freq: Two times a day (BID) | INTRAMUSCULAR | Status: DC
  Administered 2014-08-30: 40 mg via INTRAVENOUS
  Filled 2014-08-29: qty 4

## 2014-08-29 MED ORDER — FUROSEMIDE 10 MG/ML IJ SOLN
40.0000 mg | Freq: Once | INTRAMUSCULAR | Status: AC
Start: 2014-08-29 — End: 2014-08-29
  Administered 2014-08-29: 40 mg via INTRAVENOUS
  Filled 2014-08-29: qty 4

## 2014-08-29 MED ORDER — METHYLPREDNISOLONE SODIUM SUCC 125 MG IJ SOLR
125.0000 mg | Freq: Once | INTRAMUSCULAR | Status: AC
  Administered 2014-08-29: 125 mg via INTRAVENOUS
  Filled 2014-08-29: qty 2

## 2014-08-29 MED ORDER — LORAZEPAM 1 MG PO TABS
1.0000 mg | ORAL_TABLET | Freq: Once | ORAL | Status: AC
Start: 2014-08-29 — End: 2014-08-29
  Administered 2014-08-29: 1 mg via ORAL
  Filled 2014-08-29: qty 1

## 2014-08-29 MED ORDER — METHYLPREDNISOLONE SODIUM SUCC 125 MG IJ SOLR
60.0000 mg | Freq: Two times a day (BID) | INTRAMUSCULAR | Status: DC
  Administered 2014-08-30: 60 mg via INTRAVENOUS
  Filled 2014-08-29: qty 2

## 2014-08-29 MED ORDER — PRAVASTATIN SODIUM 40 MG PO TABS
40.0000 mg | ORAL_TABLET | Freq: Every day | ORAL | Status: DC
  Administered 2014-08-29: 40 mg via ORAL
  Filled 2014-08-29 (×2): qty 1

## 2014-08-29 MED ORDER — FLUTICASONE PROPIONATE HFA 110 MCG/ACT IN AERO
1.0000 | INHALATION_SPRAY | Freq: Two times a day (BID) | RESPIRATORY_TRACT | Status: DC
  Administered 2014-08-29 – 2014-08-30 (×2): 1 via RESPIRATORY_TRACT
  Filled 2014-08-29: qty 12

## 2014-08-29 MED ORDER — INSULIN ASPART 100 UNIT/ML ~~LOC~~ SOLN
0.0000 [IU] | Freq: Three times a day (TID) | SUBCUTANEOUS | Status: DC
  Administered 2014-08-30 (×2): 3 [IU] via SUBCUTANEOUS

## 2014-08-29 MED ORDER — INSULIN ASPART 100 UNIT/ML ~~LOC~~ SOLN: 0.0000 [IU] | Freq: Every day | SUBCUTANEOUS | Status: DC

## 2014-08-29 MED ORDER — HYDROCODONE-ACETAMINOPHEN 5-325 MG PO TABS: 1.0000 | ORAL_TABLET | ORAL | Status: DC | PRN

## 2014-08-29 MED ORDER — FENOFIBRATE 54 MG PO TABS
54.0000 mg | ORAL_TABLET | Freq: Every day | ORAL | Status: DC
  Administered 2014-08-30: 54 mg via ORAL
  Filled 2014-08-29: qty 1

## 2014-08-29 MED ORDER — ACETAMINOPHEN 325 MG PO TABS: 650.0000 mg | ORAL_TABLET | ORAL | Status: DC | PRN

## 2014-08-29 MED ORDER — GADOBENATE DIMEGLUMINE 529 MG/ML IV SOLN
20.0000 mL | Freq: Once | INTRAVENOUS | Status: AC | PRN
  Administered 2014-08-29: 20 mL via INTRAVENOUS

## 2014-08-29 MED ORDER — METOPROLOL TARTRATE 50 MG PO TABS
50.0000 mg | ORAL_TABLET | Freq: Two times a day (BID) | ORAL | Status: DC
Start: 2014-08-29 — End: 2014-08-30
  Administered 2014-08-29 – 2014-08-30 (×2): 50 mg via ORAL
  Filled 2014-08-29 (×2): qty 1

## 2014-08-29 MED ORDER — LEVOFLOXACIN IN D5W 750 MG/150ML IV SOLN
750.0000 mg | INTRAVENOUS | Status: DC
  Administered 2014-08-29: 750 mg via INTRAVENOUS
  Filled 2014-08-29 (×2): qty 150

## 2014-08-29 MED ORDER — SODIUM CHLORIDE 0.9 % IV SOLN: 250.0000 mL | INTRAVENOUS | Status: DC | PRN

## 2014-08-29 MED ORDER — DILTIAZEM HCL ER 120 MG PO CP24
120.0000 mg | ORAL_CAPSULE | Freq: Every day | ORAL | Status: DC
  Administered 2014-08-29: 120 mg via ORAL
  Filled 2014-08-29 (×2): qty 1

## 2014-08-29 MED ORDER — ACETAMINOPHEN 325 MG PO TABS
650.0000 mg | ORAL_TABLET | Freq: Four times a day (QID) | ORAL | Status: DC | PRN
Start: 2014-08-29 — End: 2014-08-30

## 2014-08-29 MED ORDER — ASPIRIN EC 81 MG PO TBEC
81.0000 mg | DELAYED_RELEASE_TABLET | Freq: Two times a day (BID) | ORAL | Status: DC
  Administered 2014-08-29 – 2014-08-30 (×2): 81 mg via ORAL
  Filled 2014-08-29 (×2): qty 1

## 2014-08-29 MED ORDER — LEVALBUTEROL HCL 0.63 MG/3ML IN NEBU
0.6300 mg | INHALATION_SOLUTION | Freq: Once | RESPIRATORY_TRACT | Status: AC
  Administered 2014-08-29: 0.63 mg via RESPIRATORY_TRACT
  Filled 2014-08-29: qty 3

## 2014-08-29 MED ORDER — SODIUM CHLORIDE 0.9 % IJ SOLN: 3.0000 mL | INTRAMUSCULAR | Status: DC | PRN

## 2014-08-29 MED ORDER — FUROSEMIDE 10 MG/ML IJ SOLN: 40.0000 mg | Freq: Two times a day (BID) | INTRAMUSCULAR | Status: DC

## 2014-08-29 MED ORDER — LISINOPRIL 2.5 MG PO TABS
2.5000 mg | ORAL_TABLET | Freq: Every day | ORAL | Status: DC
  Administered 2014-08-30: 2.5 mg via ORAL
  Filled 2014-08-29: qty 1

## 2014-08-29 NOTE — ED Notes (Signed)
Reports intermitent tremors since last night, seen at neuro clinic and sent for eval, is A/O with breif tremors in triage, no pain, no CP, no SOB, NAD

## 2014-08-29 NOTE — Patient Instructions (Signed)
I am referring you to go to the ER to have additional testing and expedited workup.

## 2014-08-29 NOTE — ED Provider Notes (Signed)
CSN: [id number]     Arrival date & time 08/29/14  1356 History   None    Chief Complaint  Patient presents with  . Tremors     (Consider location/radiation/quality/duration/timing/severity/associated sxs/prior Treatment) Patient is a 73 y.o. male presenting with neurologic complaint. The history is provided by the patient and a relative.  Neurologic Problem This is a new problem. The current episode started yesterday. The problem occurs constantly. The problem has been unchanged. Pertinent negatives include no abdominal pain, chest pain, diaphoresis, fever, headaches, myalgias, nausea, rash, sore throat, vomiting or weakness. Nothing aggravates the symptoms. He has tried nothing for the symptoms.    Past Medical History  Diagnosis Date  . CAD (coronary artery disease)   . Acute non-ST segment elevation myocardial infarction   . Left renal mass   . Diverticulitis   . COPD (chronic obstructive pulmonary disease)   . HTN (hypertension)   . Myocardial infarction 06/19/2011  . Ulcer 1960'S  . Renal neoplasm     LEFT  . Blood transfusion   . Diverticulitis 1996  . Depression    Past Surgical History  Procedure Laterality Date  . Coronary artery bypass grafting x4  06/28/11    Dr Servando Snare  . Hernia repair surgery x4.    . Coronary artery bypass graft  06/2011    4 VESSELS  . Colon resection  2005    FOR DIVERTICULITIS  . Robot assisted laparoscopic nephrectomy  11/29/2011    Procedure: ROBOTIC ASSISTED LAPAROSCOPIC NEPHRECTOMY;  Surgeon: Dutch Gray, MD;  Location: [facility];  Service: Urology;  Laterality: Left;  PARTIAL left nephrectomy    . Cardioversion  01/11/2012    Procedure: CARDIOVERSION;  Surgeon: Laverda Page, MD;  Location: [facility];  Service: Cardiovascular;  Laterality: N/A;  . Colonoscopy  02/02/2012    Procedure: COLONOSCOPY;  Surgeon: Jeryl Columbia, MD;  Location: [facility] ENDOSCOPY;  Service: Endoscopy;  Laterality: N/A;  magod /ja   Family History  Problem Relation  Age of Onset  . Heart disease Father   . Alzheimer's disease Mother   . Alcohol abuse Brother    History  Substance Use Topics  . Smoking status: Former Smoker    Types: Cigarettes    Quit date: 06/19/2011  . Smokeless tobacco: Never Used  . Alcohol Use: No    Review of Systems  Constitutional: Negative for fever, diaphoresis, activity change and appetite change.  HENT: Negative for facial swelling, sore throat, tinnitus, trouble swallowing and voice change.   Eyes: Negative for pain, redness and visual disturbance.  Respiratory: Negative for chest tightness, shortness of breath and wheezing.   Cardiovascular: Positive for leg swelling. Negative for chest pain and palpitations.  Gastrointestinal: Negative for nausea, vomiting, abdominal pain, diarrhea, constipation and abdominal distention.  Endocrine: Negative.   Genitourinary: Negative.  Negative for dysuria, decreased urine volume, scrotal swelling and testicular pain.  Musculoskeletal: Negative for myalgias, back pain and gait problem.  Skin: Negative.  Negative for rash.  Neurological: Positive for tremors. Negative for dizziness, weakness and headaches.  Psychiatric/Behavioral: Positive for sleep disturbance. Negative for suicidal ideas, hallucinations and self-injury. The patient is not nervous/anxious.       Allergies  Review of patient's allergies indicates no known allergies.  Home Medications   Prior to Admission medications   Medication Sig Start Date End Date Taking? Authorizing Provider  albuterol (PROVENTIL HFA;VENTOLIN HFA) 108 (90 BASE) MCG/ACT inhaler Inhale 2 puffs into the lungs every 4 (four) hours as needed  for wheezing or shortness of breath.   Yes Historical Provider, MD  aspirin EC 81 MG tablet Take 81 mg by mouth 2 (two) times daily.   Yes Historical Provider, MD  diltiazem (DILACOR XR) 120 MG 24 hr capsule Take 120 mg by mouth at bedtime.   Yes Historical Provider, MD  fenofibrate (TRICOR) 145 MG  tablet Take 145 mg by mouth daily after breakfast.    Yes Historical Provider, MD  fluticasone (FLONASE) 50 MCG/ACT nasal spray Place 1 spray into both nostrils daily.  06/19/14  Yes Historical Provider, MD  gabapentin (NEURONTIN) 300 MG capsule Take 1 capsule by mouth 2 (two) times daily. 07/03/14  Yes Historical Provider, MD  lisinopril (PRINIVIL,ZESTRIL) 2.5 MG tablet Take 2.5 mg by mouth daily after breakfast.    Yes Historical Provider, MD  metFORMIN (GLUCOPHAGE) 1000 MG tablet Take 1,000 mg by mouth 2 (two) times daily with a meal.   Yes Historical Provider, MD  metoprolol (LOPRESSOR) 50 MG tablet Take 50 mg by mouth 2 (two) times daily.  12/27/13  Yes Historical Provider, MD  nitroGLYCERIN (NITROSTAT) 0.4 MG SL tablet Place 0.4 mg under the tongue every 5 (five) minutes x 3 doses as needed for chest pain. For chest pain   Yes Historical Provider, MD  pravastatin (PRAVACHOL) 40 MG tablet Take 40 mg by mouth at bedtime.  11/27/13  Yes Historical Provider, MD  PRESCRIPTION MEDICATION Place 1 drop into both eyes 2 (two) times daily. Gen Teal eye drop   Yes Historical Provider, MD  torsemide (DEMADEX) 20 MG tablet Take 20 mg by mouth daily.   Yes Historical Provider, MD  traMADol (ULTRAM) 50 MG tablet Take 50 mg by mouth every 8 (eight) hours as needed for severe pain.  01/02/14  Yes Historical Provider, MD  triamcinolone cream (KENALOG) 0.1 % Apply 1 application topically daily. APPLY TO FEET 12/18/13  Yes Historical Provider, MD   BP 138/82 mmHg  Pulse 86  Temp(Src) 98 F (36.7 C) (Oral)  Resp 22  Ht 6' (1.829 m)  Wt 215 lb (97.523 kg)  BMI 29.15 kg/m2  SpO2 95% Physical Exam  Constitutional: He is oriented to person, place, and time. He appears well-developed and well-nourished. No distress.  HENT:  Head: Normocephalic and atraumatic.  Right Ear: External ear normal.  Left Ear: External ear normal.  Nose: Nose normal.  Mouth/Throat: Oropharynx is clear and moist.  Eyes: Conjunctivae and  EOM are normal. Pupils are equal, round, and reactive to light. No scleral icterus.  Neck: Normal range of motion. Neck supple. No JVD present. No tracheal deviation present. No thyromegaly present.  Cardiovascular: Normal rate and intact distal pulses.  Exam reveals no gallop and no friction rub.   No murmur heard. Pulmonary/Chest: Effort normal and breath sounds normal. No stridor. No respiratory distress. He has no wheezes. He has no rales.  Abdominal: Soft. He exhibits no distension. There is no tenderness. There is no rebound and no guarding.  Musculoskeletal: Normal range of motion. He exhibits no edema or tenderness.  Neurological: He is alert and oriented to person, place, and time.  intermittent myoclonic jerks of head, neck and bilateral arms at rest. + Asterixis. Normal speech. GCS 15.   Skin: Skin is warm and dry. No rash noted. He is not diaphoretic.  Psychiatric: He has a normal mood and affect. His behavior is normal.  Nursing note and vitals reviewed.   ED Course  Procedures (including critical care time) Labs Review Labs Reviewed  CBC - Abnormal; Notable for the following:    Platelets 136 (*)    All other components within normal limits  BASIC METABOLIC PANEL - Abnormal; Notable for the following:    Sodium 136 (*)    Glucose, Bld 105 (*)    GFR calc non Af Amer 64 (*)    GFR calc Af Amer 75 (*)    All other components within normal limits  CBC WITH DIFFERENTIAL - Abnormal; Notable for the following:    Platelets 130 (*)    All other components within normal limits  AMMONIA  TSH  URINALYSIS, ROUTINE W REFLEX MICROSCOPIC  COMPREHENSIVE METABOLIC PANEL  T4, FREE  MAGNESIUM  CK  BLOOD GAS, ARTERIAL  TSH  HEPATIC FUNCTION PANEL  VITAMIN B12  FOLATE  PRO B NATRIURETIC PEPTIDE  I-STAT TROPOININ, ED  I-STAT TROPOININ, ED    Imaging Review No results found.   EKG Interpretation   Date/Time:  Thursday August 29 2014 14:50:02 EST Ventricular Rate:   87 PR Interval:    QRS Duration: 164 QT Interval:  438 QTC Calculation: 527 R Axis:   104 Text Interpretation:  Atrial fibrillation Multiple ventricular premature  complexes RBBB and LPFB Inferior infarct, old Confirmed by RAY MD,  Andee Poles [phone]) on 08/29/2014 3:30:57 PM      MDM   Final diagnoses:  Tremor    The patient is a 73 y.o. M with hx of HTN and CHF who is sent in from neurology clinic for workup of tremors starting 24 hours ago. Patient AFVSS. Exam as above. No significant electrolyte abnormalities on initial lab work up. Patient given PO ativan with moderate improvement of tremors. Patient admitted to hospitalist service for further symptom control and follow up of MRI and remaining lab tests. Patient and family informed of this plan and express understanding and agreement.     Margaretann Loveless, MD 08/29/14 7425  Shaune Pollack, MD 08/31/14 [phone]

## 2014-08-29 NOTE — Progress Notes (Signed)
[facility]  PATIENT: [name] DOB: 06/07/1941  REFERRING CLINICIAN: Ganji HISTORY FROM: patient, wife, daughters x 2 REASON FOR VISIT: urgent consult   HISTORICAL  CHIEF COMPLAINT:  Chief Complaint  Patient presents with  . Tremors    HISTORY OF PRESENT ILLNESS:   73 year old male with hypertension, coronary artery disease status post CABG, atrial flutter with rapid ventricular response in 2013, chronic atrial fibrillation not on anticoagulation due to history of GI diverticular bleeding, congestive heart failure, left renal mass status post resection, here for evaluation of new onset tremor shaking movements since yesterday. Patient is accompanied by his family for this visit.  I was asked to see this patient urgently in neurology clinic from Dr. Einar Gip, who saw the patient yesterday for cardiac issues. Of note 2 weeks ago patient had bronchitis with reported recovery. However patient continued to have some shortness of breath as well as reported 20 pound weight gain over past few weeks. He followed up with cardiology and PCP, and felt to have acute on chronic diastolic heart failure. Patient's medications were adjusted to improve his diuresis.  Yesterday during the day he started to have intermittent involuntary shaking movements of his head, shoulders, neck, hands. Symptoms worse when he is laying down. He is not able to suppress these movements. If he is standing when these movements occur, he tends to lose his balance. Symptoms gradually increased in severity last night. Today episodes are quite frequent. Episodes last for 2 seconds at a time, multiple times a minute. He does not lose consciousness. He was unable to sleep last night due to severity of these involuntary movements.  Of note I previously saw patient in 2011 for a different issue, consisting of syncopal event and recurrent dizzy episodes. 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    REVIEW OF SYSTEMS: Full 14 system review of systems performed and notable only for frequent waking, tremor, shaking. Denies fevers or chills, anxiety, headache.  ALLERGIES: No Known Allergies  HOME MEDICATIONS: Outpatient Prescriptions Prior to Visit  Medication Sig Dispense Refill  . aspirin EC 81 MG tablet Take 81 mg by mouth 2 (two) times daily.    Marland Kitchen diltiazem (DILACOR XR) 120 MG 24 hr capsule Take 120 mg by mouth at bedtime.    . fenofibrate (TRICOR) 145 MG tablet Take 145 mg by mouth daily after breakfast.     . lisinopril (PRINIVIL,ZESTRIL) 2.5 MG tablet Take 2.5 mg by mouth daily after breakfast.     . metFORMIN (GLUCOPHAGE) 1000 MG tablet Take 1,000 mg by mouth 2 (two) times daily with a meal.    . metoprolol (LOPRESSOR) 50 MG tablet Take 50 mg by mouth 2 (two) times daily.     . nitroGLYCERIN (NITROSTAT) 0.4 MG SL tablet Place 0.4 mg under the tongue every 5 (five) minutes x 3 doses as needed for chest pain. For chest pain    . pravastatin (PRAVACHOL) 40 MG tablet Take 40 mg by mouth at bedtime.     . traMADol (ULTRAM) 50 MG tablet Take 50 mg by mouth every 8 (eight) hours as needed for severe pain.     Marland Kitchen triamcinolone cream (KENALOG) 0.1 % Apply 1 application topically daily. APPLY TO FEET    . fluticasone (FLONASE) 50 MCG/ACT nasal spray Place 1 spray into both nostrils every evening.     . Omega-3 Fatty Acids (FISH OIL) 1000 MG CAPS  Take 2,000 mg by mouth every morning.    . triamterene-hydrochlorothiazide (MAXZIDE-25) 37.5-25 MG per tablet Take 1 tablet by mouth daily.     No facility-administered medications prior to visit.    PAST MEDICAL HISTORY: Past Medical History  Diagnosis Date  . CAD (coronary artery disease)   . Acute non-ST segment elevation myocardial infarction   . Left renal mass   . Diverticulitis   . COPD (chronic obstructive pulmonary disease)   . HTN (hypertension)   . Myocardial infarction 06/19/2011   . Ulcer 1960'S  . Renal neoplasm     LEFT  . Blood transfusion   . Diverticulitis 1996  . Depression     PAST SURGICAL HISTORY: Past Surgical History  Procedure Laterality Date  . Coronary artery bypass grafting x4  06/28/11    Dr Servando Snare  . Hernia repair surgery x4.    . Coronary artery bypass graft  06/2011    4 VESSELS  . Colon resection  2005    FOR DIVERTICULITIS  . Robot assisted laparoscopic nephrectomy  11/29/2011    Procedure: ROBOTIC ASSISTED LAPAROSCOPIC NEPHRECTOMY;  Surgeon: Dutch Gray, MD;  Location: [facility];  Service: Urology;  Laterality: Left;  PARTIAL left nephrectomy    . Cardioversion  01/11/2012    Procedure: CARDIOVERSION;  Surgeon: Laverda Page, MD;  Location: [place];  Service: Cardiovascular;  Laterality: N/A;  . Colonoscopy  02/02/2012    Procedure: COLONOSCOPY;  Surgeon: Jeryl Columbia, MD;  Location: [facility];  Service: Endoscopy;  Laterality: N/A;  magod /ja    FAMILY HISTORY: Family History  Problem Relation Age of Onset  . Heart disease Father   . Alzheimer's disease Mother   . Alcohol abuse Brother     SOCIAL HISTORY:  History   Social History  . Marital Status: Married    Spouse Name: N/A    Number of Children: 3  . Years of Education: N/A   Occupational History  .     Social History Main Topics  . Smoking status: Former Smoker    Types: Cigarettes    Quit date: 06/19/2011  . Smokeless tobacco: Never Used  . Alcohol Use: No  . Drug Use: No  . Sexual Activity: Not on file   Other Topics Concern  . Not on file   Social History Narrative     PHYSICAL EXAM  Filed Vitals:   08/29/14 1301  BP: 129/87  Pulse: 82  Temp: 98 F (36.7 C)  TempSrc: Oral  Height: 6' (1.829 m)  Weight: 217 lb (98.431 kg)    Body mass index is 29.42 kg/(m^2).   Visual Acuity Screening   Right eye Left eye Both eyes  Without correction: 20/70 20/70   With correction:       No flowsheet data found.  GENERAL EXAM: PATIENT  APPEARS MILDLY ILL; SOFT SPOKEN. STOOPED POSTURE. Neck is supple  CARDIOVASCULAR: DISTANT HEAR SOUNDS Regular rate and rhythm, no murmurs, no carotid bruits  NEUROLOGIC: MENTAL STATUS: awake, alert, language fluent, comprehension intact, naming intact, fund of knowledge appropriate CRANIAL NERVE: no papilledema on fundoscopic exam, pupils equal and reactive to light, visual fields full to confrontation, extraocular muscles intact, no nystagmus, facial sensation and strength symmetric, hearing intact, palate elevates symmetrically, uvula midline, shoulder shrug symmetric, tongue midline. MOTOR: FREQUENT, INTERMITTENT MULTIFOCAL MYOCLONUS WITH INTERMIXED ASTERIXIS OF HEAD AND ARMS. MILD POSTURAL TREMOR IN BUE, WITH INTERMIXED INTERMITTENT LOSS OF MUSCLE TONE (I.E. ASTERIXIS). Normal bulk; BUE 4+; BLE 4. SENSORY:  normal and symmetric to light touch, pinprick, temperature, vibration and proprioception COORDINATION: finger-nose-finger, fine finger movements, heel-shin normal REFLEXES: deep tendon reflexes present and symmetric GAIT/STATION: STOOPED POSTURE, UNSTEADY; WHEN HE HAS A MYOCLONUS/ASTERIXIS ATTACK, HE ALMOST FALLS DOWN   DIAGNOSTIC DATA (LABS, IMAGING, TESTING) - I reviewed patient records, labs, notes, testing and imaging myself where available.  Lab Results  Component Value Date   WBC 10.3 01/14/2014   HGB 16.6 01/14/2014   HCT 47.5 01/14/2014   MCV 90.5 01/14/2014   PLT 118* 01/14/2014      Component Value Date/Time   NA 137 01/14/2014 0432   K 4.0 01/14/2014 0432   CL 99 01/14/2014 0432   CO2 25 01/14/2014 0432   GLUCOSE 112* 01/14/2014 0432   BUN 25* 01/14/2014 0432   CREATININE 1.03 01/14/2014 0432   CALCIUM 8.8 01/14/2014 0432   PROT 7.2 01/14/2014 0432   ALBUMIN 3.7 01/14/2014 0432   AST 19 01/14/2014 0432   ALT 19 01/14/2014 0432   ALKPHOS 33* 01/14/2014 0432   BILITOT 0.5 01/14/2014 0432   GFRNONAA 69* 01/14/2014 0432   GFRAA 80* 01/14/2014 0432   Lab  Results  Component Value Date   CHOL 132 06/20/2011   HDL 27* 06/20/2011   LDLCALC 76 06/20/2011   TRIG 147 06/20/2011   CHOLHDL 4.9 06/20/2011   Lab Results  Component Value Date   HGBA1C 5.8* 10/29/2011   No results found for: [id number] Lab Results  Component Value Date   TSH 2.012 02/02/2012    I reviewed images myself and agree with interpretation. -VRP  02/18/10 MRI brain - Age related atrophy and mild chronic small vessel disease. No acute or reversible pathology.  02/18/10 MRA head - No evidence of intracranial aneurysm. 80% or greater stenosis of the distal left vertebral artery just proximal to the basilar. Some atherosclerotic change in the more distal intracranial branch vessels, most notable in the PCA territories, right more than left.  02/18/10 MRA neck - Mild atherosclerotic disease of both carotid bifurcations but without measurable stenosis. Mild stenotic disease of both vertebral artery origins.    ASSESSMENT AND PLAN  73 y.o. year old male here with new onset tremor, asterixis and multifocal myoclonus since 08/28/14, progressively getting worse last night and today. Also with worsening CHF recently and recent bout of bronchitis.  Suspect a toxic/metabolic etiology (hyperammonemia, liver disease, renal disease, electrolytes) vs infection/sepsis vs hypoxia/stroke/vascular vs paraneoplastic cause.   PLAN: - needs expedited workup due to sudden onset and progressive symptoms. I spoke with charge nurse (Mali) at [facility] about his arrival.  - check MRI brain (w/wo), labs (CBC, CMP, ammonia, CK, TSH, free T4, UA, CXR); to be arranged through ER - may need admission pending results and symptoms   Return in about 1 month (around 09/29/2014).    Penni Bombard, MD 31/49/7026, 3:78 PM Certified in Neurology, Neurophysiology and Neuroimaging  [facility] [address] [place], [place] [address] [phone]

## 2014-08-29 NOTE — Progress Notes (Signed)
**Note De-Identified  Obfuscation** ABG not collected due to patient unavailability.

## 2014-08-29 NOTE — H&P (Signed)
History and Physical       Hospital Admission Note Date: 08/29/2014  Patient name: GAMBLE ENDERLE Medical record number: [id number] Date of birth: August 31, 1941 Age: 73 y.o. Gender: male  PCP: Donnie Coffin, MD    Chief Complaint:  Tremors, shortness of breath with wheezing  HPI: Patient is a 72 year old male with history of hypertension, CAD status post CABG, diabetes, atrial fibrillation, not on" due to prior history of GI bleed, CHF on Demadex and presented to ED with new onset of shaking movements of his hands and head since yesterday. Patient was seen by outpatient neurologist, Dr. Leta Baptist today afternoon in the clinic. Patient was sent to the ER for further workup. Per patient, he has been having shortness of breath with wheezing for the last 2 weeks. He was diagnosed as bronchitis and had been recovering. However patient also noted that he had 20 lbs weight gain in the past few weeks. He followed up with his cardiologist, Dr. Einar Gip who noticed the tremors and sent the patient to the neurologist, Dr. Leta Baptist. Patient reported that yesterday evening he noted intermittent shaking movements of his head, shoulders and neck and hand, involuntary. Patient's wife reported that he had similar symptoms in 2013 and no etiology was found. At that time the tremors resolved spontaneously. He denied any loss of consciousness. Patient's wife did notice any slurred speech or facial drooping or any other neurological deficits. In the ER, patient was also found to be wheezing and short of breath. Hospitalist service was requested for admission.  Review of Systems:  Constitutional: Denies fever, chills, diaphoresis, poor appetite and fatigue.  HEENT: Denies photophobia, eye pain, redness, hearing loss, ear pain, congestion, sore throat, rhinorrhea, sneezing, mouth sores, trouble swallowing, neck pain, neck stiffness and tinnitus.   Respiratory: Please  see history of present illness  Cardiovascular: Denies chest pain, palpitations and leg swelling.  Gastrointestinal: Denies nausea, vomiting, abdominal pain, diarrhea, constipation, blood in stool and abdominal distention.  Genitourinary: Denies dysuria, urgency, frequency, hematuria, flank pain and difficulty urinating.  Musculoskeletal: Denies myalgias, back pain, joint swelling, arthralgias and gait problem.  Skin: Denies pallor, rash and wound.  Neurological: Denies dizziness, seizures, syncope, weakness, light-headedness, numbness and headaches. + tremors Hematological: Denies adenopathy. Easy bruising, personal or family bleeding history  Psychiatric/Behavioral: Denies suicidal ideation, mood changes, confusion, nervousness, sleep disturbance and agitation  Past Medical History: Past Medical History  Diagnosis Date  . CAD (coronary artery disease)   . Acute non-ST segment elevation myocardial infarction   . Left renal mass   . Diverticulitis   . COPD (chronic obstructive pulmonary disease)   . HTN (hypertension)   . Myocardial infarction 06/19/2011  . Ulcer 1960'S  . Renal neoplasm     LEFT  . Blood transfusion   . Diverticulitis 1996  . Depression    Past Surgical History  Procedure Laterality Date  . Coronary artery bypass grafting x4  06/28/11    Dr Servando Snare  . Hernia repair surgery x4.    . Coronary artery bypass graft  06/2011    4 VESSELS  . Colon resection  2005    FOR DIVERTICULITIS  . Robot assisted laparoscopic nephrectomy  11/29/2011    Procedure: ROBOTIC ASSISTED LAPAROSCOPIC NEPHRECTOMY;  Surgeon: Dutch Gray, MD;  Location: [facility];  Service: Urology;  Laterality: Left;  PARTIAL left nephrectomy    . Cardioversion  01/11/2012    Procedure: CARDIOVERSION;  Surgeon: Laverda Page, MD;  Location: [place];  Service: Cardiovascular;  Laterality:  N/A;  . Colonoscopy  02/02/2012    Procedure: COLONOSCOPY;  Surgeon: Jeryl Columbia, MD;  Location[facility];   Service: Endoscopy;  Laterality: N/A;  magod /ja    Medications: Prior to Admission medications   Medication Sig Start Date End Date Taking? Authorizing Provider  albuterol (PROVENTIL HFA;VENTOLIN HFA) 108 (90 BASE) MCG/ACT inhaler Inhale 2 puffs into the lungs every 4 (four) hours as needed for wheezing or shortness of breath.   Yes Historical Provider, MD  aspirin EC 81 MG tablet Take 81 mg by mouth 2 (two) times daily.   Yes Historical Provider, MD  diltiazem (DILACOR XR) 120 MG 24 hr capsule Take 120 mg by mouth at bedtime.   Yes Historical Provider, MD  fenofibrate (TRICOR) 145 MG tablet Take 145 mg by mouth daily after breakfast.    Yes Historical Provider, MD  fluticasone (FLONASE) 50 MCG/ACT nasal spray Place 1 spray into both nostrils daily.  06/19/14  Yes Historical Provider, MD  gabapentin (NEURONTIN) 300 MG capsule Take 1 capsule by mouth 2 (two) times daily. 07/03/14  Yes Historical Provider, MD  lisinopril (PRINIVIL,ZESTRIL) 2.5 MG tablet Take 2.5 mg by mouth daily after breakfast.    Yes Historical Provider, MD  metFORMIN (GLUCOPHAGE) 1000 MG tablet Take 1,000 mg by mouth 2 (two) times daily with a meal.   Yes Historical Provider, MD  metoprolol (LOPRESSOR) 50 MG tablet Take 50 mg by mouth 2 (two) times daily.  12/27/13  Yes Historical Provider, MD  nitroGLYCERIN (NITROSTAT) 0.4 MG SL tablet Place 0.4 mg under the tongue every 5 (five) minutes x 3 doses as needed for chest pain. For chest pain   Yes Historical Provider, MD  pravastatin (PRAVACHOL) 40 MG tablet Take 40 mg by mouth at bedtime.  11/27/13  Yes Historical Provider, MD  PRESCRIPTION MEDICATION Place 1 drop into both eyes 2 (two) times daily. Gen Teal eye drop   Yes Historical Provider, MD  torsemide (DEMADEX) 20 MG tablet Take 20 mg by mouth daily.   Yes Historical Provider, MD  traMADol (ULTRAM) 50 MG tablet Take 50 mg by mouth every 8 (eight) hours as needed for severe pain.  01/02/14  Yes Historical Provider, MD   triamcinolone cream (KENALOG) 0.1 % Apply 1 application topically daily. APPLY TO FEET 12/18/13  Yes Historical Provider, MD    Allergies:  No Known Allergies  Social History:  reports that he quit smoking about 3 years ago. His smoking use included Cigarettes. He smoked 0.00 packs per day. He has never used smokeless tobacco. He reports that he does not drink alcohol or use illicit drugs.  Family History: Family History  Problem Relation Age of Onset  . Heart disease Father   . Alzheimer's disease Mother   . Alcohol abuse Brother     Physical Exam: Blood pressure 138/82, pulse 86, temperature 98 F (36.7 C), temperature source Oral, resp. rate 22, height 6' (1.829 m), weight 97.523 kg (215 lb), SpO2 95 %. General: Alert, awake, oriented x3, in no acute distress, no dysarthria, tremors of head, neck+. HEENT: normocephalic, atraumatic, anicteric sclera, pink conjunctiva, pupils equal and reactive to light and accomodation, oropharynx clear Neck: supple, no masses or lymphadenopathy, no goiter, no bruits  Heart: Regular rate and rhythm, without murmurs, rubs or gallops. Lungs: Diffuse mild expiratory wheezing bilaterally with bibasilar crackles Abdomen: Soft, nontender, nondistended, positive bowel sounds, no masses. Extremities: No clubbing, cyanosis, trace edema with positive pedal pulses. Neuro: Grossly intact, no focal neurological deficits,  strength 5/5 upper and lower extremities bilaterally, tremors but normal finger-to-nose test, gait not tested  Psych: alert and oriented x 3, normal mood and affect Skin: no rashes or lesions, warm and dry   LABS on Admission:  Basic Metabolic Panel:  Recent Labs Lab 08/29/14 1427  NA 136*  K 3.9  CL 96  CO2 26  GLUCOSE 105*  BUN 16  CREATININE 1.09  CALCIUM 9.5   Liver Function Tests: No results for input(s): AST, ALT, ALKPHOS, BILITOT, PROT, ALBUMIN in the last 168 hours. No results for input(s): LIPASE, AMYLASE in the last  168 hours. No results for input(s): AMMONIA in the last 168 hours. CBC:  Recent Labs Lab 08/29/14 1427 08/29/14 1539  WBC 6.7 6.5  NEUTROABS  --  4.5  HGB 15.6 15.7  HCT 45.5 45.5  MCV 87.3 87.7  PLT 136* 130*   Cardiac Enzymes: No results for input(s): CKTOTAL, CKMB, CKMBINDEX, TROPONINI in the last 168 hours. BNP: Invalid input(s): POCBNP CBG: No results for input(s): GLUCAP in the last 168 hours.   Radiological Exams on Admission: Dg Chest 2 View  08/26/2014   CLINICAL DATA:  Wheezing.  Shortness of breath.  EXAM: CHEST  2 VIEW  COMPARISON:  01/16/2014 .  FINDINGS: Mediastinum and hilar structures are normal. Prior CABG. Cardiomegaly with mild pulmonary vascular prominence and interstitial prominence. Small right pleural effusion cannot be excluded. These findings suggest mild congestive heart failure. No pneumothorax. No acute osseus abnormality.  IMPRESSION: Mild congestive heart failure with mild interstitial edema and small right pleural effusion. Mild interstitial pneumonitis cannot be excluded.   Electronically Signed   By: Marcello Moores  Register   On: 08/26/2014 13:00    Assessment/Plan Principal Problem:   Tremor: Unclear etiology - , Possibly toxic/metabolic etiology, rule out other causes infection/hypoxia or vascular etiology - Discussed with neurology service, will obtain MRI brain, C-spine, if MRI positive for stroke only then follow the stroke pathway - Obtain LFTs, ammonia level, ABG, TSH, B12, folate - Hold Neurontin and tramadol  Active Problems: Acute COPD exacerbation - Placed on IV Solu Medrol, scheduled Xopenex and Atrovent, Flovent, Levaquin, flutter valve  Acute on chronic CHF exacerbation: Chest x-ray with pulmonary vascular congestion, 20lbs weight gain in the last 2 weeks per patient, on Demadex at home - No echocardiogram on record, rule out ACS, obtain 2-D echo, placed on IV Lasix, check BNP - Strict I's and O's and daily weights, hold Demadex while  on IV Lasix    CAD (coronary artery disease) -Check 2-D echocardiogram, continue aspirin, diltiazem, TriCor, lisinopril, metoprolol, statin     Essential hypertension, benign - Currently stable, continue metoprolol, lisinopril, Cardizem  Atrial fibrillation - Currently rate controlled, not on any antiplatelet medications contacted to prior history of GI bleed, continue aspirin 81 mg daily    Pure hypercholesterolemia - Obtain lipid panel, continue statin     DiabetesType II, non-insulin-dependent - Obtain hemoglobin A1c, place on sliding scale insulin  DVT prophylaxis: Lovenox  CODE STATUS: Full CODE STATUS  Family Communication: Admission, patients condition and plan of care including tests being ordered have been discussed with the patient and wife who indicates understanding and agree with the plan and Code Status   Further plan will depend as patient's clinical course evolves and further radiologic and laboratory data become available.   Time Spent on Admission: 1 hour  RAI,RIPUDEEP M.D. [facility] 08/29/2014, 4:34 PM Pager: [phone]  If 7PM-7AM, please contact night-coverage [email or web address] Password [email or web address]

## 2014-08-29 NOTE — Consult Note (Signed)
NEURO HOSPITALIST CONSULT NOTE    Reason for Consult: tremor  HPI:                                                                                                                                          DEMAREA LOREY is an 73 y.o. male with hypertension, coronary artery disease status post CABG, atrial flutter with rapid ventricular response in 2013, chronic atrial fibrillation not on anticoagulation due to history of GI diverticular bleeding, congestive heart failure, left renal mass status post resection, seen by Dr. Leta Baptist  for evaluation of new onset tremor shaking movements since yesterday.   He was asked to see this patient urgently in neurology clinic from Dr. Einar Gip, who saw the patient yesterday for cardiac issues. Of note 2 weeks ago patient had bronchitis with reported recovery. However patient continued to have some shortness of breath as well as reported 20 pound weight gain over past few weeks. He followed up with cardiology and PCP, and felt to have acute on chronic diastolic heart failure. Patient's medications were adjusted to improve his diuresis.  Yesterday during the day he started to have intermittent involuntary shaking movements of his head, shoulders, neck, hands. Symptoms worse when he is laying down. He is not able to suppress these movements. If he is standing when these movements occur, he tends to lose his balance. Symptoms gradually increased in severity last night. Today episodes are quite frequent. Episodes last for 2 seconds at a time, multiple times a minute. He does not lose consciousness. He was unable to sleep last night due to severity of these involuntary movements.  Due to concern this may be toxic/metabolic etiology (hyperammonemia, liver disease, renal disease, electrolytes) vs infection/sepsis vs hypoxia/stroke/vascular he was sent to ED to expedite the work up.   Past Medical History  Diagnosis Date  . CAD (coronary artery disease)    . Acute non-ST segment elevation myocardial infarction   . Left renal mass   . Diverticulitis   . COPD (chronic obstructive pulmonary disease)   . HTN (hypertension)   . Myocardial infarction 06/19/2011  . Ulcer 1960'S  . Renal neoplasm     LEFT  . Blood transfusion   . Diverticulitis 1996  . Depression     Past Surgical History  Procedure Laterality Date  . Coronary artery bypass grafting x4  06/28/11    Dr Servando Snare  . Hernia repair surgery x4.    . Coronary artery bypass graft  06/2011    4 VESSELS  . Colon resection  2005    FOR DIVERTICULITIS  . Robot assisted laparoscopic nephrectomy  11/29/2011    Procedure: ROBOTIC ASSISTED LAPAROSCOPIC NEPHRECTOMY;  Surgeon: Dutch Gray, MD;  Location: [facility];  Service: Urology;  Laterality: Left;  PARTIAL  left nephrectomy    . Cardioversion  01/11/2012    Procedure: CARDIOVERSION;  Surgeon: Laverda Page, MD;  Location: [place];  Service: Cardiovascular;  Laterality: N/A;  . Colonoscopy  02/02/2012    Procedure: COLONOSCOPY;  Surgeon: Jeryl Columbia, MD;  Location: [facility];  Service: Endoscopy;  Laterality: N/A;  magod /ja    Family History  Problem Relation Age of Onset  . Heart disease Father   . Alzheimer's disease Mother   . Alcohol abuse Brother      Social History:  reports that he quit smoking about 3 years ago. His smoking use included Cigarettes. He smoked 0.00 packs per day. He has never used smokeless tobacco. He reports that he does not drink alcohol or use illicit drugs.  No Known Allergies  MEDICATIONS:                                                                                                                     No current facility-administered medications for this encounter.   Current Outpatient Prescriptions  Medication Sig Dispense Refill  . albuterol (PROVENTIL HFA;VENTOLIN HFA) 108 (90 BASE) MCG/ACT inhaler Inhale 2 puffs into the lungs every 4 (four) hours as needed for wheezing or shortness of  breath.    Marland Kitchen aspirin EC 81 MG tablet Take 81 mg by mouth 2 (two) times daily.    Marland Kitchen diltiazem (DILACOR XR) 120 MG 24 hr capsule Take 120 mg by mouth at bedtime.    . fenofibrate (TRICOR) 145 MG tablet Take 145 mg by mouth daily after breakfast.     . fluticasone (FLONASE) 50 MCG/ACT nasal spray Place 1 spray into both nostrils daily.   2  . gabapentin (NEURONTIN) 300 MG capsule Take 1 capsule by mouth 2 (two) times daily.  12  . lisinopril (PRINIVIL,ZESTRIL) 2.5 MG tablet Take 2.5 mg by mouth daily after breakfast.     . metFORMIN (GLUCOPHAGE) 1000 MG tablet Take 1,000 mg by mouth 2 (two) times daily with a meal.    . metoprolol (LOPRESSOR) 50 MG tablet Take 50 mg by mouth 2 (two) times daily.     . nitroGLYCERIN (NITROSTAT) 0.4 MG SL tablet Place 0.4 mg under the tongue every 5 (five) minutes x 3 doses as needed for chest pain. For chest pain    . pravastatin (PRAVACHOL) 40 MG tablet Take 40 mg by mouth at bedtime.     Marland Kitchen PRESCRIPTION MEDICATION Place 1 drop into both eyes 2 (two) times daily. Gen Teal eye drop    . torsemide (DEMADEX) 20 MG tablet Take 20 mg by mouth daily.    . traMADol (ULTRAM) 50 MG tablet Take 50 mg by mouth every 8 (eight) hours as needed for severe pain.     Marland Kitchen triamcinolone cream (KENALOG) 0.1 % Apply 1 application topically daily. APPLY TO FEET        ROS:  History obtained from the patient  General ROS: negative for - chills, fatigue, fever, night sweats, weight gain or weight loss Psychological ROS: negative for - behavioral disorder, hallucinations, memory difficulties, mood swings or suicidal ideation Ophthalmic ROS: negative for - blurry vision, double vision, eye pain or loss of vision ENT ROS: negative for - epistaxis, nasal discharge, oral lesions, sore throat, tinnitus or vertigo Allergy and Immunology ROS: negative for - hives  or itchy/watery eyes Hematological and Lymphatic ROS: negative for - bleeding problems, bruising or swollen lymph nodes Endocrine ROS: negative for - galactorrhea, hair pattern changes, polydipsia/polyuria or temperature intolerance Respiratory ROS: negative for - cough, hemoptysis, shortness of breath or wheezing Cardiovascular ROS: negative for - chest pain, dyspnea on exertion, edema or irregular heartbeat Gastrointestinal ROS: negative for - abdominal pain, diarrhea, hematemesis, nausea/vomiting or stool incontinence Genito-Urinary ROS: negative for - dysuria, hematuria, incontinence or urinary frequency/urgency Musculoskeletal ROS: negative for - joint swelling or muscular weakness Neurological ROS: as noted in HPI Dermatological ROS: negative for rash and skin lesion changes   Blood pressure 138/82, pulse 86, temperature 98 F (36.7 C), temperature source Oral, resp. rate 22, height 6' (1.829 m), weight 97.523 kg (215 lb), SpO2 95 %.   Neurologic Examination:                                                                                                      HEENT-  Normocephalic, no lesions, without obvious abnormality.  Normal external eye and conjunctiva.  Normal TM's bilaterally.  Normal auditory canals and external ears. Normal external nose, mucus membranes and septum.  Normal pharynx. Cardiovascular- irregularly irregular rhythm, pulses palpable throughout   Lungs- mild expiratory wheezing heard both upper lobes,  Abdomen- soft, non-tender; bowel sounds normal; no masses,  no organomegaly Extremities- less then 2 second capillary refill Lymph-no adenopathy palpable Musculoskeletal-no joint tenderness, deformity or swelling Skin-warm and dry, no hyperpigmentation, vitiligo, or suspicious lesions  Neurological Examination Mental Status: Alert, oriented, thought content appropriate.  Speech fluent without evidence of aphasia.  Able to follow 3 step commands without  difficulty. Cranial Nerves: II: Discs flat bilaterally; Visual fields grossly normal, pupils equal, round, reactive to light and accommodation III,IV, VI: ptosis not present, extra-ocular motions intact bilaterally V,VII: smile symmetric, facial light touch sensation normal bilaterally VIII: hearing normal bilaterally IX,X: gag reflex present XI: bilateral shoulder shrug XII: midline tongue extension Motor: Right : Upper extremity   5/5    Left:     Upper extremity   5/5  Lower extremity   5/5     Lower extremity   5/5 --bilateral multifocal myoclonus which is intermittent.  He also has a "no no " tremor" affecting his neck.  Tone and bulk:normal tone throughout; no atrophy noted Sensory: Pinprick and light touch intact throughout, bilaterally Deep Tendon Reflexes: 2+ and symmetric throughout Plantars: Right: downgoing   Left: downgoing Cerebellar: normal finger-to-nose, normal heel-to-shin test Gait: normal gait and station      Lab Results: Basic Metabolic Panel:  Recent Labs Lab 08/29/14 1427  NA 136*  K  3.9  CL 96  CO2 26  GLUCOSE 105*  BUN 16  CREATININE 1.09  CALCIUM 9.5    Liver Function Tests: No results for input(s): AST, ALT, ALKPHOS, BILITOT, PROT, ALBUMIN in the last 168 hours. No results for input(s): LIPASE, AMYLASE in the last 168 hours. No results for input(s): AMMONIA in the last 168 hours.  CBC:  Recent Labs Lab 08/29/14 1427  WBC 6.7  HGB 15.6  HCT 45.5  MCV 87.3  PLT 136*    Cardiac Enzymes: No results for input(s): CKTOTAL, CKMB, CKMBINDEX, TROPONINI in the last 168 hours.  Lipid Panel: No results for input(s): CHOL, TRIG, HDL, CHOLHDL, VLDL, LDLCALC in the last 168 hours.  CBG: No results for input(s): GLUCAP in the last 168 hours.  Microbiology: Results for orders placed or performed during the hospital encounter of 11/23/11  Surgical pcr screen     Status: None   Collection Time: 11/23/11 11:11 AM  Result Value Ref Range  Status   MRSA, PCR NEGATIVE NEGATIVE Final   Staphylococcus aureus NEGATIVE NEGATIVE Final    Comment:        The Xpert SA Assay (FDA approved for NASAL specimens only), is one component of a comprehensive surveillance program.  It is not intended to diagnose infection nor to guide or monitor treatment.    Coagulation Studies: No results for input(s): LABPROT, INR in the last 72 hours.  Imaging: No results found.   Assessment and plan per attending neurologist  Etta Quill PA-C [facility] [phone]  08/29/2014, 4:17 PM   Assessment/Plan:  73 y.o. year old male here with new onset tremor, asterixis and multifocal myoclonus since 08/28/14, progressively getting worse last night and today. Also with worsening CHF recently and recent bout of bronchitis.  Suspect a toxic/metabolic etiology (hyperammonemia, liver disease, renal disease, electrolytes) vs infection/sepsis vs hypoxia/stroke/vascular etiology  Recommend: 1) MRI brain and C-spine 2) ABG 3) TSH 4) metabolic work up including LFT, CMET, CBC     Patient seen and evaluated. Agree with above assessment and plan. In brief he is a 73y/o gentleman presenting with new onset tremor, asterixis and multifocal myoclonus. Suspect likely related to a toxic/metabolic etiology. Will admit for further workup.  Jim Like, DO [facility] [phone]  If 7pm- 7am, please page neurology on call as listed in [place].

## 2014-08-29 NOTE — Progress Notes (Signed)
Pt admitted to room 4N 27. Pt is alert and oriented and in no distress.  Cardiac monitor placed. Pt oriented to room.  Will continue to monitor.  Fredrich Romans, RN 08/29/2014 8:46 PM

## 2014-08-29 NOTE — ED Provider Notes (Signed)
73 y.o. Male with tremors began yesterday.  Patient with recent change in medications.  PE Filed Vitals:   08/29/14 1601  BP: 138/82  Pulse:   Temp:   Resp: 22   WDWN male nad Intermittent clonic movement all extremities and neck.  Patient awake and alert.   EKG Interpretation  Date/Time:  Thursday August 29 2014 14:50:02 EST Ventricular Rate:  87 PR Interval:    QRS Duration: 164 QT Interval:  438 QTC Calculation: 527 R Axis:   104 Text Interpretation:  Atrial fibrillation Multiple ventricular premature complexes RBBB and LPFB Inferior infarct, old Confirmed by Chelsie Burel MD, Andee Poles [phone]) on 08/29/2014 3:30:57 PM      Patient sent from neurology clinic with new onset of clonic movement.  Electrolytes normal here, magnesium pending.   Neurohospitalist consulted and hospitalist will admit for mr of brain.  I saw and evaluated the patient, reviewed the resident's note and I agree with the findings and plan.   EKG Interpretation   Date/Time:  Thursday August 29 2014 14:50:02 EST Ventricular Rate:  87 PR Interval:    QRS Duration: 164 QT Interval:  438 QTC Calculation: 527 R Axis:   104 Text Interpretation:  Atrial fibrillation Multiple ventricular premature  complexes RBBB and LPFB Inferior infarct, old Confirmed by Michelyn Scullin MD,  Lakesha Levinson [phone]) on 08/29/2014 3:30:57 PM        Shaune Pollack, MD 08/29/14 1626

## 2014-08-30 ENCOUNTER — Encounter (HOSPITAL_COMMUNITY): Payer: Self-pay | Admitting: General Practice

## 2014-08-30 DIAGNOSIS — R251 Tremor, unspecified: Secondary | ICD-10-CM

## 2014-08-30 DIAGNOSIS — J209 Acute bronchitis, unspecified: Secondary | ICD-10-CM

## 2014-08-30 LAB — BASIC METABOLIC PANEL
Anion gap: 15 (ref 5–15)
BUN: 20 mg/dL (ref 6–23)
CO2: 25 mEq/L (ref 19–32)
CREATININE: 1.02 mg/dL (ref 0.50–1.35)
Calcium: 9.4 mg/dL (ref 8.4–10.5)
Chloride: 97 mEq/L (ref 96–112)
GFR calc non Af Amer: 70 mL/min — ABNORMAL LOW (ref >90)
GFR, EST AFRICAN AMERICAN: 81 mL/min — AB (ref >90)
Glucose, Bld: 180 mg/dL — ABNORMAL HIGH (ref 70–99)
POTASSIUM: 3.6 meq/L — AB (ref 3.7–5.3)
Sodium: 137 mEq/L (ref 137–147)

## 2014-08-30 LAB — GLUCOSE, CAPILLARY
GLUCOSE-CAPILLARY: 170 mg/dL — AB (ref 70–99)
Glucose-Capillary: 193 mg/dL — ABNORMAL HIGH (ref 70–99)

## 2014-08-30 LAB — CBC
HCT: 47.6 % (ref 39.0–52.0)
Hemoglobin: 16.5 g/dL (ref 13.0–17.0)
MCH: 30.6 pg (ref 26.0–34.0)
MCHC: 34.7 g/dL (ref 30.0–36.0)
MCV: 88.3 fL (ref 78.0–100.0)
PLATELETS: 140 10*3/uL — AB (ref 150–400)
RBC: 5.39 MIL/uL (ref 4.22–5.81)
RDW: 15.1 % (ref 11.5–15.5)
WBC: 5.1 10*3/uL (ref 4.0–10.5)

## 2014-08-30 LAB — T4, FREE: FREE T4: 1.57 ng/dL (ref 0.80–1.80)

## 2014-08-30 LAB — POCT I-STAT TROPONIN I: TROPONIN I, POC: 0.01 ng/mL (ref 0.00–0.08)

## 2014-08-30 LAB — VITAMIN B12: Vitamin B-12: 379 pg/mL (ref 211–911)

## 2014-08-30 LAB — FOLATE: FOLATE: 19.7 ng/mL

## 2014-08-30 LAB — HEMOGLOBIN A1C
Hgb A1c MFr Bld: 6.9 % — ABNORMAL HIGH (ref <5.7)
Mean Plasma Glucose: 151 mg/dL — ABNORMAL HIGH (ref <117)

## 2014-08-30 MED ORDER — FLUTICASONE PROPIONATE HFA 110 MCG/ACT IN AERO: 1.0000 | INHALATION_SPRAY | Freq: Two times a day (BID) | RESPIRATORY_TRACT | Status: AC

## 2014-08-30 MED ORDER — TORSEMIDE 20 MG PO TABS: 20.0000 mg | ORAL_TABLET | Freq: Every day | ORAL | Status: DC

## 2014-08-30 MED ORDER — TORSEMIDE 20 MG PO TABS
20.0000 mg | ORAL_TABLET | Freq: Every day | ORAL | Status: DC
  Administered 2014-08-30: 20 mg via ORAL
  Filled 2014-08-30: qty 1

## 2014-08-30 MED ORDER — LEVOFLOXACIN 500 MG PO TABS: 500.0000 mg | ORAL_TABLET | Freq: Every day | ORAL | Status: DC

## 2014-08-30 MED ORDER — PREDNISONE 20 MG PO TABS: ORAL_TABLET | ORAL | Status: DC

## 2014-08-30 MED ORDER — LEVOFLOXACIN 500 MG PO TABS
500.0000 mg | ORAL_TABLET | Freq: Every day | ORAL | Status: DC
  Administered 2014-08-30: 500 mg via ORAL
  Filled 2014-08-30: qty 1

## 2014-08-30 MED ORDER — PREDNISONE 20 MG PO TABS
60.0000 mg | ORAL_TABLET | Freq: Every day | ORAL | Status: DC
  Administered 2014-08-30: 60 mg via ORAL
  Filled 2014-08-30: qty 3

## 2014-08-30 MED ORDER — POTASSIUM CHLORIDE CRYS ER 20 MEQ PO TBCR
40.0000 meq | EXTENDED_RELEASE_TABLET | Freq: Once | ORAL | Status: AC
Start: 2014-08-30 — End: 2014-08-30
  Administered 2014-08-30: 40 meq via ORAL
  Filled 2014-08-30: qty 2

## 2014-08-30 MED ORDER — PNEUMOCOCCAL VAC POLYVALENT 25 MCG/0.5ML IJ INJ: 0.5000 mL | INJECTION | INTRAMUSCULAR | Status: DC

## 2014-08-30 NOTE — Progress Notes (Signed)
Occupational Therapy Evaluation and Discharge Patient Details Name: [name] MRN: [id number] DOB: [birth year] Today's Date: 08/30/2014    History of Present Illness 73 yo admitted due to onset of tremor shaking. Pt with similar symptoms in 2013 with idiopathic workup. Acute COPD exacerbation, acute CHF exacerbation and further workup underway for source of tremor. pmh: HTN, CAD, CABG, Atrial flutter with rapid ventricular response 2013, diverticulitis, CHF, L renal mass s/p resection,depression   Clinical Impression   PTA pt lived at home and was independent with ADLs. Pt requires min guard for safety with functional mobility and ADLs. Educated pt and family on fall prevention and safety with ADLs at home. No UE tremor noted. No further acute OT needs.     Follow Up Recommendations  No OT follow up;Supervision/Assistance - 24 hour    Equipment Recommendations  None recommended by OT    Recommendations for Other Services       Precautions / Restrictions Precautions Precautions: Fall Restrictions Weight Bearing Restrictions: No      Mobility Bed Mobility Overal bed mobility: Independent                Transfers Overall transfer level: Needs assistance Equipment used: None Transfers: Sit to/from Stand Sit to Stand: Min guard         General transfer comment: Min guard for safety and balance    Balance Overall balance assessment: Needs assistance Sitting-balance support: No upper extremity supported;Feet supported Sitting balance-Leahy Scale: Good     Standing balance support: No upper extremity supported Standing balance-Leahy Scale: Fair                          ADL Overall ADL's : Needs assistance/impaired                                       General ADL Comments: Min guard for ADLs due to safety. No UE tremor noted at this time. UE strength WFL. Educated pt and family on fall prevention and safety with ADLs at home.       Vision  Pt reports no change from baseline.                    Perception Perception Perception Tested?: No   Praxis Praxis Praxis tested?: Within functional limits    Pertinent Vitals/Pain Pain Assessment: No/denies pain     Hand Dominance Right   Extremity/Trunk Assessment Upper Extremity Assessment Upper Extremity Assessment: Overall WFL for tasks assessed (no tremor noted; strength WFL)   Lower Extremity Assessment Lower Extremity Assessment: Overall WFL for tasks assessed   Cervical / Trunk Assessment Cervical / Trunk Assessment: Normal   Communication Communication Communication: No difficulties   Cognition Arousal/Alertness: Awake/alert Behavior During Therapy: WFL for tasks assessed/performed;Impulsive Overall Cognitive Status: Within Functional Limits for tasks assessed                                Home Living Family/patient expects to be discharged to:: Private residence Living Arrangements: Spouse/significant other Available Help at Discharge: Family;Available 24 hours/day Type of Home: House Home Access: Stairs to enter [employer] of Steps: 2   Home Layout: One level               Home Equipment: Walker - 2 wheels;Cane -  single point;Shower seat          Prior Functioning/Environment Level of Independence: Independent             OT Diagnosis: Generalized weakness                          End of Session  Activity Tolerance: Patient tolerated treatment well Patient left: in bed;with call bell/phone within reach;with family/visitor present   Time: 1335-1346 OT Time Calculation (min): 11 min Charges:  OT General Charges $OT Visit: 1 Procedure OT Evaluation $Initial OT Evaluation Tier I: 1 Procedure G-Codes: OT G-codes **NOT FOR INPATIENT CLASS** Functional Assessment Tool Used: clinical judgement Functional Limitation: Self care Self Care Current Status (O3606): At least 1 percent  but less than 20 percent impaired, limited or restricted Self Care Goal Status (V7034): At least 1 percent but less than 20 percent impaired, limited or restricted Self Care Discharge Status [phone]): At least 1 percent but less than 20 percent impaired, limited or restricted  Juluis Rainier 08/30/2014, 1:53 PM  Cyndie Chime, OTR/L Occupational Therapist [phone] (pager)

## 2014-08-30 NOTE — Progress Notes (Signed)
*  PRELIMINARY RESULTS* Echocardiogram 2D Echocardiogram has been performed.  Leavy Cella 08/30/2014, 9:26 AM

## 2014-08-30 NOTE — Progress Notes (Signed)
Pt discharging with wife and daughters at this time. Pt is alert, verbal with no noted distress. He denies pain or discomfort. Tolerated all meds and nebulizer txs without difficulty. IV discontinued applied dry dressing. Discharge instructions and education provided along with prescriptions given to his daughter to fill at the pharmacy with verbal understanding. Pt and family are aware of follow up appts arranged and to be made. Pt stated that he has assistive devices at home. Home health to follow up.

## 2014-08-30 NOTE — Progress Notes (Signed)
Subjective:   Objective: Current vital signs: BP 123/68 mmHg  Pulse 81  Temp(Src) 97.5 F (36.4 C) (Oral)  Resp 20  Ht 6\' 2"  (1.88 m)  Wt 96.299 kg (212 lb 4.8 oz)  BMI 27.25 kg/m2  SpO2 93% Vital signs in last 24 hours: Temp:  [97.5 F (36.4 C)-98.4 F (36.9 C)] 97.5 F (36.4 C) (12/18 1350) Pulse Rate:  [79-114] 81 (12/18 1350) Resp:  [13-24] 20 (12/18 1350) BP: (97-155)/(54-93) 123/68 mmHg (12/18 1350) SpO2:  [90 %-96 %] 93 % (12/18 1350) Weight:  [95.528 kg (210 lb 9.6 oz)-97.523 kg (215 lb)] 96.299 kg (212 lb 4.8 oz) (12/18 0500)  Intake/Output from previous day: 12/17 0701 - 12/18 0700 In: -  Out: 1100 [Urine:1100] Intake/Output this shift: Total I/O In: -  Out: 475 [Urine:475] Nutritional status: Diet heart healthy/carb modified Diet Carb Modified  Neurologic Exam: Mental Status: Alert, oriented, thought content appropriate. Speech fluent without evidence of aphasia. Able to follow 3 step commands without difficulty. Cranial Nerves: II: Discs flat bilaterally; Visual fields grossly normal, pupils equal, round, reactive to light and accommodation III,IV, VI: ptosis not present, extra-ocular motions intact bilaterally V,VII: smile symmetric, facial light touch sensation normal bilaterally VIII: hearing normal bilaterally IX,X: gag reflex present XI: bilateral shoulder shrug XII: midline tongue extension Motor: Right :Upper extremity 5/5Left: Upper extremity 5/5 Lower extremity 5/5Lower extremity 5/5 No head tremor noted.  No hand tremor or asterixis noted.   Sensory: Pinprick and light touch intact throughout, bilaterally Deep Tendon Reflexes: 2+ and symmetric throughout Plantars: Right: downgoingLeft: downgoing  Lab Results: Basic Metabolic Panel:  Recent Labs Lab 08/29/14 1427 08/29/14 1539  08/30/14 0556  NA 136* 135* 137  K 3.9 3.9 3.6*  CL 96 96 97  CO2 26 21 25   GLUCOSE 105* 102* 180*  BUN 16 16 20   CREATININE 1.09 0.98 1.02  CALCIUM 9.5 9.4 9.4  MG  --  2.0  --     Liver Function Tests:  Recent Labs Lab 08/29/14 1539 08/29/14 1808  AST 27 24  ALT 16 17  ALKPHOS 36* 39  BILITOT 0.6 0.6  PROT 7.8 8.2  ALBUMIN 3.9 4.2   No results for input(s): LIPASE, AMYLASE in the last 168 hours.  Recent Labs Lab 08/29/14 1540  AMMONIA 53    CBC:  Recent Labs Lab 08/29/14 1427 08/29/14 1539 08/30/14 0556  WBC 6.7 6.5 5.1  NEUTROABS  --  4.5  --   HGB 15.6 15.7 16.5  HCT 45.5 45.5 47.6  MCV 87.3 87.7 88.3  PLT 136* 130* 140*    Cardiac Enzymes:  Recent Labs Lab 08/29/14 1539  CKTOTAL 111    Lipid Panel: No results for input(s): CHOL, TRIG, HDL, CHOLHDL, VLDL, LDLCALC in the last 168 hours.  CBG:  Recent Labs Lab 08/29/14 2213 08/30/14 0635 08/30/14 1130  GLUCAP 188* 170* 193*    Microbiology: Results for orders placed or performed during the hospital encounter of 11/23/11  Surgical pcr screen     Status: None   Collection Time: 11/23/11 11:11 AM  Result Value Ref Range Status   MRSA, PCR NEGATIVE NEGATIVE Final   Staphylococcus aureus NEGATIVE NEGATIVE Final    Comment:        The Xpert SA Assay (FDA approved for NASAL specimens only), is one component of a comprehensive surveillance program.  It is not intended to diagnose infection nor to guide or monitor treatment.    Coagulation Studies: No results for input(s): LABPROT,  INR in the last 72 hours.  Imaging: Dg Chest 2 View  08/29/2014   CLINICAL DATA:  Shaking for 2 days.  EXAM: CHEST  2 VIEW  COMPARISON:  08/26/2014  FINDINGS: Sternotomy wires unchanged. Lungs are adequately inflated with minimal prominence of the perihilar markings. No focal consolidation or effusion. Mild stable cardiomegaly. Remainder of the exam is unchanged.  IMPRESSION: Possible mild vascular  congestion.  Mild stable cardiomegaly.   Electronically Signed   By: Marin Olp M.D.   On: 08/29/2014 17:35   Mr Jeri Cos LG Contrast  08/29/2014   CLINICAL DATA:  Tremors. New onset of shaking movements in his hands and head beginning yesterday.  EXAM: MRI HEAD WITHOUT AND WITH CONTRAST  MRI CERVICAL SPINE WITHOUT CONTRAST  TECHNIQUE: Multiplanar, multiecho pulse sequences of the brain and surrounding structures, and cervical spine, to include the craniocervical junction and cervicothoracic junction, were obtained without intravenous contrast.  CONTRAST:  41mL MULTIHANCE GADOBENATE DIMEGLUMINE 529 MG/ML IV SOLN  COMPARISON:  CT head without contrast 10/19/2013. MRI brain 02/18/2010.  FINDINGS: MRI HEAD FINDINGS  The study is moderately degraded throughout by patient motion. Moderate generalized atrophy is present. Mild periventricular and subcortical white matter changes are similar to the prior study. No acute infarct, hemorrhage, or mass lesion is present. The ventricles are proportionate to the degree of atrophy. There is no significant extra-axial fluid collection. The basal ganglia and brainstem is stable.  Flow is present in the major intracranial arteries. The patient is status post bilateral lens replacements. The globes and orbits are otherwise intact.  The postcontrast images demonstrate no pathologic enhancement.  MRI CERVICAL SPINE FINDINGS  Normal signal is present in the cervical and upper thoracic spinal cord to the lowest imaged level, T4-5. Marrow signal is slightly heterogeneous without focal lesions. Vertebral body heights and alignment are maintained. The craniocervical junction is within normal limits.  Study is moderately degraded by patient motion.  C2-3: Mild foraminal narrowing bilaterally is due to uncovertebral disease.  C3-4: A broad-based disc osteophyte complex partially effaces the ventral CSF. Mild foraminal narrowing bilaterally is worse on the right.  C4-5: A broad-based disc  osteophyte complex partially effaces the ventral CSF. Uncovertebral and facet disease is noted bilaterally. There is mild foraminal narrowing bilaterally.  C5-6: A broad-based disc osteophyte complex is present. Mild foraminal narrowing is present bilaterally due to uncovertebral disease.  C6-7: A mild disc osteophyte complex is present without significant stenosis.  C7-T1:  Negative.  IMPRESSION: 1. The study is moderately degraded by patient motion. 2. Mild atrophy and white matter disease is similar to the prior study. 3. No acute or focal lesion to explain the patient's symptoms. 4. Multilevel spondylosis of cervical spine with mild foraminal narrowing bilaterally at C3-4, C4-5, and at C5-6 due to uncovertebral spurring bilaterally. 5. No focal cord signal abnormality or significant central canal stenosis.   Electronically Signed   By: Lawrence Santiago M.D.   On: 08/29/2014 20:45   Mr Cervical Spine Wo Contrast  08/29/2014   CLINICAL DATA:  Tremors. New onset of shaking movements in his hands and head beginning yesterday.  EXAM: MRI HEAD WITHOUT AND WITH CONTRAST  MRI CERVICAL SPINE WITHOUT CONTRAST  TECHNIQUE: Multiplanar, multiecho pulse sequences of the brain and surrounding structures, and cervical spine, to include the craniocervical junction and cervicothoracic junction, were obtained without intravenous contrast.  CONTRAST:  55mL MULTIHANCE GADOBENATE DIMEGLUMINE 529 MG/ML IV SOLN  COMPARISON:  CT head without contrast 10/19/2013. MRI brain 02/18/2010.  FINDINGS: MRI HEAD FINDINGS  The study is moderately degraded throughout by patient motion. Moderate generalized atrophy is present. Mild periventricular and subcortical white matter changes are similar to the prior study. No acute infarct, hemorrhage, or mass lesion is present. The ventricles are proportionate to the degree of atrophy. There is no significant extra-axial fluid collection. The basal ganglia and brainstem is stable.  Flow is present in  the major intracranial arteries. The patient is status post bilateral lens replacements. The globes and orbits are otherwise intact.  The postcontrast images demonstrate no pathologic enhancement.  MRI CERVICAL SPINE FINDINGS  Normal signal is present in the cervical and upper thoracic spinal cord to the lowest imaged level, T4-5. Marrow signal is slightly heterogeneous without focal lesions. Vertebral body heights and alignment are maintained. The craniocervical junction is within normal limits.  Study is moderately degraded by patient motion.  C2-3: Mild foraminal narrowing bilaterally is due to uncovertebral disease.  C3-4: A broad-based disc osteophyte complex partially effaces the ventral CSF. Mild foraminal narrowing bilaterally is worse on the right.  C4-5: A broad-based disc osteophyte complex partially effaces the ventral CSF. Uncovertebral and facet disease is noted bilaterally. There is mild foraminal narrowing bilaterally.  C5-6: A broad-based disc osteophyte complex is present. Mild foraminal narrowing is present bilaterally due to uncovertebral disease.  C6-7: A mild disc osteophyte complex is present without significant stenosis.  C7-T1:  Negative.  IMPRESSION: 1. The study is moderately degraded by patient motion. 2. Mild atrophy and white matter disease is similar to the prior study. 3. No acute or focal lesion to explain the patient's symptoms. 4. Multilevel spondylosis of cervical spine with mild foraminal narrowing bilaterally at C3-4, C4-5, and at C5-6 due to uncovertebral spurring bilaterally. 5. No focal cord signal abnormality or significant central canal stenosis.   Electronically Signed   By: Lawrence Santiago M.D.   On: 08/29/2014 20:45    Medications:  I have reviewed the patient's current medications. Scheduled: . aspirin EC  81 mg Oral BID  . diltiazem  120 mg Oral QHS  . enoxaparin (LOVENOX) injection  40 mg Subcutaneous Q24H  . fenofibrate  54 mg Oral Daily  . fluticasone  1  puff Inhalation BID  . insulin aspart  0-15 Units Subcutaneous TID WC  . insulin aspart  0-5 Units Subcutaneous QHS  . levalbuterol  0.63 mg Nebulization 6 times per day   And  . ipratropium  0.5 mg Nebulization Q4H  . levofloxacin  500 mg Oral Daily  . lisinopril  2.5 mg Oral QPC breakfast  . metoprolol  50 mg Oral BID  . [START ON 08/31/2014] pneumococcal 23 valent vaccine  0.5 mL Intramuscular Tomorrow-1000  . pravastatin  40 mg Oral QHS  . predniSONE  60 mg Oral Q breakfast  . sodium chloride  3 mL Intravenous Q12H  . torsemide  20 mg Oral Daily    Assessment/Plan: Tremor resolved.  Etiology remains unclear.  TSH, ammonia, LFT's, CMET and CBC unremarkable.  MRI of the brain shows no acute changes.  MRI of the cervical spine shows some degenerative changes but no cord abnormalities.    Recommendations: 1.  May follow up on an outpatient basis.  No further inpatient work up recommended.     LOS: 1 day   Alexis Goodell, MD [facility] [phone] 08/30/2014  2:20 PM

## 2014-08-30 NOTE — Evaluation (Addendum)
Physical Therapy Evaluation Patient Details Name: [name] MRN: [id number] DOB: 14-Sep-1940 Today's Date: 08/30/2014   History of Present Illness  73 yo admitted due to onset of tremor shaking. Pt with similar symptoms in 2013 with idiopathic workup. Acute COPD exacerbation, acute CHF exacerbation and further workup underway for source of tremor. pmh: HTN, CAD, CABG, Atrial flutter with rapid ventricular response 2013, diverticulitis, CHF, L renal mass s/p resection,depression  Clinical Impression  Patient presents with unsteadiness during mobility.  Performed the BERG balance test with patient.  He scored a 41/56 which puts him in the significant fall risk category.  Patient did much better using RW.  I discussed with patient and daughter to use RW at home and recommend HHPT for progressive ambulation and to assist in returning to baseline.  Feel patient safe to return home with use of RW and supervision for mobility.      Follow Up Recommendations Home health PT    Equipment Recommendations  None recommended by PT    Recommendations for Other Services       Precautions / Restrictions Precautions Precautions: Fall Restrictions Weight Bearing Restrictions: No      Mobility  Bed Mobility Overal bed mobility: Independent                Transfers Overall transfer level: Needs assistance Equipment used: None Transfers: Sit to/from Stand Sit to Stand: Min guard         General transfer comment: attempted without using hands and required 2 tries; with hands/rail - required min-guard for safety; slightly unsteady when first standing.  Ambulation/Gait Ambulation/Gait assistance: Supervision Ambulation Distance (Feet): 150 Feet Assistive device: Rolling walker (2 wheeled) Gait Pattern/deviations: Step-through pattern     General Gait Details: attempted ambulation without device -required min assist, some staggering during gait due to decreased balance.  much more  steady with RW.  Stairs            Wheelchair Mobility    Modified Rankin (Stroke Patients Only) Modified Rankin (Stroke Patients Only) Pre-Morbid Rankin Score: No symptoms Modified Rankin: Moderate disability     Balance Overall balance assessment: Needs assistance Sitting-balance support: No upper extremity supported;Feet supported Sitting balance-Leahy Scale: Good     Standing balance support: No upper extremity supported Standing balance-Leahy Scale: Fair                   Standardized Balance Assessment Standardized Balance Assessment : Berg Balance Test Berg Balance Test Sit to Stand: Able to stand  independently using hands Standing Unsupported: Able to stand 2 minutes with supervision Sitting with Back Unsupported but Feet Supported on Floor or Stool: Able to sit safely and securely 2 minutes Stand to Sit: Sits safely with minimal use of hands Transfers: Able to transfer safely, definite need of hands Standing Unsupported with Eyes Closed: Able to stand 10 seconds with supervision Standing Ubsupported with Feet Together: Able to place feet together independently and stand for 1 minute with supervision From Standing, Reach Forward with Outstretched Arm: Can reach forward >12 cm safely (5") From Standing Position, Pick up Object from Floor: Able to pick up shoe, needs supervision From Standing Position, Turn to Look Behind Over each Shoulder: Looks behind from both sides and weight shifts well Turn 360 Degrees: Able to turn 360 degrees safely but slowly Standing Unsupported, Alternately Place Feet on Step/Stool: Able to stand independently and complete 8 steps >20 seconds Standing Unsupported, One Foot in Front: Needs help to step  but can hold 15 seconds Standing on One Leg: Able to lift leg independently and hold equal to or more than 3 seconds Total Score: 41         Pertinent Vitals/Pain Pain Assessment: No/denies pain    Home Living  Family/patient expects to be discharged to:: Private residence Living Arrangements: Spouse/significant other Available Help at Discharge: Family;Available 24 hours/day Type of Home: House Home Access: Stairs to enter   [employer] of Steps: 2 Home Layout: One level Home Equipment: Walker - 2 wheels;Cane - single point;Shower seat      Prior Function Level of Independence: Independent               Hand Dominance        Extremity/Trunk Assessment   Upper Extremity Assessment: Defer to OT evaluation           Lower Extremity Assessment: Overall WFL for tasks assessed         Communication   Communication: No difficulties  Cognition Arousal/Alertness: Awake/alert Behavior During Therapy: WFL for tasks assessed/performed Overall Cognitive Status: Within Functional Limits for tasks assessed                      General Comments      Exercises        Assessment/Plan    PT Assessment Patient needs continued PT services  PT Diagnosis Difficulty walking   PT Problem List Decreased balance;Decreased mobility;Decreased safety awareness  PT Treatment Interventions DME instruction;Gait training;Functional mobility training;Therapeutic activities   PT Goals (Current goals can be found in the Care Plan section) Acute Rehab PT Goals Patient Stated Goal: go home by lunchtime PT Goal Formulation: With patient/family Time For Goal Achievement: 09/02/14 Potential to Achieve Goals: Good    Frequency Min 4X/week   Barriers to discharge        Co-evaluation               End of Session Equipment Utilized During Treatment: Gait belt Activity Tolerance: Patient tolerated treatment well Patient left: in chair;with call bell/phone within reach;with family/visitor present      Functional Assessment Tool Used: clinical judgement, BERG Functional Limitation: Mobility: Walking and moving around Mobility: Walking and Moving Around Current  Status [phone]): At least 1 percent but less than 20 percent impaired, limited or restricted Mobility: Walking and Moving Around Goal Status [phone]): 0 percent impaired, limited or restricted    Time: 1015-1059 PT Time Calculation (min) (ACUTE ONLY): 44 min   Charges:   PT Evaluation $Initial PT Evaluation Tier I: 1 Procedure PT Treatments $Therapeutic Activity: 8-22 mins   PT G Codes:   Functional Assessment Tool Used: clinical judgement, BERG Functional Limitation: Mobility: Walking and moving around    Shanna Cisco 08/30/2014, 11:28 AM  08/30/2014 [place], [place]

## 2014-08-30 NOTE — Progress Notes (Signed)
08/30/2014 1450 NCM contacted pt to arrange [place]. No answer. Will make attempt again to arrange [facility] PT. Jonnie Finner RN CCM Case Mgmt phone [phone]

## 2014-08-30 NOTE — Discharge Instructions (Signed)

## 2014-08-30 NOTE — Discharge Summary (Signed)
[facility]  Physician Discharge Summary   Patient ID: [name] MRN: [id number] DOB/AGE: [birth year] 73 y.o.  Admit date: 08/29/2014 Discharge date: 08/30/2014  PCP: Donnie Coffin, MD  DISCHARGE DIAGNOSES:  Principal Problem:   Tremor Active Problems:   CAD (coronary artery disease)   Essential hypertension, benign   Pure hypercholesterolemia   Diabetes   COPD with acute exacerbation   RECOMMENDATIONS FOR OUTPATIENT FOLLOW UP: 1. Echocardiogram report is pending 2. Home health will be arranged  DISCHARGE CONDITION: fair  Diet recommendation: Modified carbohydrate  Filed Weights   08/29/14 1424 08/29/14 2046 08/30/14 0500  Weight: 97.523 kg (215 lb) 95.528 kg (210 lb 9.6 oz) 96.299 kg (212 lb 4.8 oz)    INITIAL HISTORY: 73 year old Caucasian male was admitted with tremors with shortness of breath and wheezing. He was thought to have acute bronchitis. Tremors were thought to be due to his acute illness rather than a neurological process. Was admitted for further evaluation.  Consultations: Neurology  Procedures: Echocardiogram report is pending  HOSPITAL COURSE:   Tremor These have resolved. Etiology is unclear. Possibly toxic/metabolic etiology. MRI brain did not show any acute stroke. MRI of the cervical spine shows multilevel spondylosis without any other concerning findings. Patient's medications were reviewed. Neurontin can cause tremors, so he has been asked to hold this medicine for now. LFTs and ammonia level was normal. B-12, folate level and TSH were also normal.  Acute bronchitis versus COPD exacerbation He was placed on steroids and nebulizer treatments along with antibiotics. He was also prescribed inhaled steroids. His symptoms have significantly improved. He has ambulated without any shortness of breath. He really wants to go home. He has been asked to stop smoking. He may benefit from outpatient pulmonary function tests. He should discuss  this further with his PCP.  History of congestive heart failure, unknown type  Previous echocardiogram and cardiac catheterization reports have shown normal systolic function. Echocardiogram has been done today, but the report is still pending. Chest x-ray did not show any interstitial edema. His examination is also not consistent with acute CHF. He was given IV Lasix. He has diuresed some. He will be transitioned back to his oral Demadex. He tells me that he was started on this medicine by his cardiologist but hasn't started taking it yet. He was instructed to start that today. And he needs to follow-up with his cardiologist with whom he has an appointment in the next 2 weeks.   History of CAD (coronary artery disease) Stable. Denies any chest pain. Continue current medications. Follow up on report of echocardiogram.  Essential hypertension, benign Currently stable, continue metoprolol, lisinopril, Cardizem  History of chronic Atrial fibrillation Currently rate controlled. Only on aspirin. Not on anticoagulation due to history of GI bleed. He can follow up with his cardiologist for same.  DiabetesType II, non-insulin-dependent He may resume his home medication regimen. His HbA1c is 6.9.  Home health will be arranged for the patient. He is very keen to go home today itself. He is completely stable at this time. His tremors have resolved. His breathing is much improved. He can be discharged today. Echocardiogram report can be followed up as an outpatient.   PERTINENT LABS:  The results of significant diagnostics from this hospitalization (including imaging, microbiology, ancillary and laboratory) are listed below for reference.    Labs: Basic Metabolic Panel:  Recent Labs Lab 08/29/14 1427 08/29/14 1539 08/30/14 0556  NA 136* 135* 137  K 3.9 3.9 3.6*  CL 96  96 97  CO2 26 21 25   GLUCOSE 105* 102* 180*  BUN 16 16 20   CREATININE 1.09 0.98 1.02  CALCIUM 9.5 9.4 9.4  MG  --  2.0  --     Liver Function Tests:  Recent Labs Lab 08/29/14 1539 08/29/14 1808  AST 27 24  ALT 16 17  ALKPHOS 36* 39  BILITOT 0.6 0.6  PROT 7.8 8.2  ALBUMIN 3.9 4.2    Recent Labs Lab 08/29/14 1540  AMMONIA 53   CBC:  Recent Labs Lab 08/29/14 1427 08/29/14 1539 08/30/14 0556  WBC 6.7 6.5 5.1  NEUTROABS  --  4.5  --   HGB 15.6 15.7 16.5  HCT 45.5 45.5 47.6  MCV 87.3 87.7 88.3  PLT 136* 130* 140*   Cardiac Enzymes:  Recent Labs Lab 08/29/14 1539  CKTOTAL 111   BNP: BNP (last 3 results)  Recent Labs  08/29/14 1803 08/29/14 2140  PROBNP 1356.0* 1294.0*   CBG:  Recent Labs Lab 08/29/14 2213 08/30/14 0635 08/30/14 1130  GLUCAP 188* 170* 193*     IMAGING STUDIES Dg Chest 2 View  08/29/2014   CLINICAL DATA:  Shaking for 2 days.  EXAM: CHEST  2 VIEW  COMPARISON:  08/26/2014  FINDINGS: Sternotomy wires unchanged. Lungs are adequately inflated with minimal prominence of the perihilar markings. No focal consolidation or effusion. Mild stable cardiomegaly. Remainder of the exam is unchanged.  IMPRESSION: Possible mild vascular congestion.  Mild stable cardiomegaly.   Electronically Signed   By: Marin Olp M.D.   On: 08/29/2014 17:35   Dg Chest 2 View  08/26/2014   CLINICAL DATA:  Wheezing.  Shortness of breath.  EXAM: CHEST  2 VIEW  COMPARISON:  01/16/2014 .  FINDINGS: Mediastinum and hilar structures are normal. Prior CABG. Cardiomegaly with mild pulmonary vascular prominence and interstitial prominence. Small right pleural effusion cannot be excluded. These findings suggest mild congestive heart failure. No pneumothorax. No acute osseus abnormality.  IMPRESSION: Mild congestive heart failure with mild interstitial edema and small right pleural effusion. Mild interstitial pneumonitis cannot be excluded.   Electronically Signed   By: Marcello Moores  Register   On: 08/26/2014 13:00   Mr Jeri Cos Wo Contrast  08/29/2014   CLINICAL DATA:  Tremors. New onset of shaking  movements in his hands and head beginning yesterday.  EXAM: MRI HEAD WITHOUT AND WITH CONTRAST  MRI CERVICAL SPINE WITHOUT CONTRAST  TECHNIQUE: Multiplanar, multiecho pulse sequences of the brain and surrounding structures, and cervical spine, to include the craniocervical junction and cervicothoracic junction, were obtained without intravenous contrast.  CONTRAST:  17mL MULTIHANCE GADOBENATE DIMEGLUMINE 529 MG/ML IV SOLN  COMPARISON:  CT head without contrast 10/19/2013. MRI brain 02/18/2010.  FINDINGS: MRI HEAD FINDINGS  The study is moderately degraded throughout by patient motion. Moderate generalized atrophy is present. Mild periventricular and subcortical white matter changes are similar to the prior study. No acute infarct, hemorrhage, or mass lesion is present. The ventricles are proportionate to the degree of atrophy. There is no significant extra-axial fluid collection. The basal ganglia and brainstem is stable.  Flow is present in the major intracranial arteries. The patient is status post bilateral lens replacements. The globes and orbits are otherwise intact.  The postcontrast images demonstrate no pathologic enhancement.  MRI CERVICAL SPINE FINDINGS  Normal signal is present in the cervical and upper thoracic spinal cord to the lowest imaged level, T4-5. Marrow signal is slightly heterogeneous without focal lesions. Vertebral body heights and alignment are  maintained. The craniocervical junction is within normal limits.  Study is moderately degraded by patient motion.  C2-3: Mild foraminal narrowing bilaterally is due to uncovertebral disease.  C3-4: A broad-based disc osteophyte complex partially effaces the ventral CSF. Mild foraminal narrowing bilaterally is worse on the right.  C4-5: A broad-based disc osteophyte complex partially effaces the ventral CSF. Uncovertebral and facet disease is noted bilaterally. There is mild foraminal narrowing bilaterally.  C5-6: A broad-based disc osteophyte complex  is present. Mild foraminal narrowing is present bilaterally due to uncovertebral disease.  C6-7: A mild disc osteophyte complex is present without significant stenosis.  C7-T1:  Negative.  IMPRESSION: 1. The study is moderately degraded by patient motion. 2. Mild atrophy and white matter disease is similar to the prior study. 3. No acute or focal lesion to explain the patient's symptoms. 4. Multilevel spondylosis of cervical spine with mild foraminal narrowing bilaterally at C3-4, C4-5, and at C5-6 due to uncovertebral spurring bilaterally. 5. No focal cord signal abnormality or significant central canal stenosis.   Electronically Signed   By: Lawrence Santiago M.D.   On: 08/29/2014 20:45   Mr Cervical Spine Wo Contrast  08/29/2014   CLINICAL DATA:  Tremors. New onset of shaking movements in his hands and head beginning yesterday.  EXAM: MRI HEAD WITHOUT AND WITH CONTRAST  MRI CERVICAL SPINE WITHOUT CONTRAST  TECHNIQUE: Multiplanar, multiecho pulse sequences of the brain and surrounding structures, and cervical spine, to include the craniocervical junction and cervicothoracic junction, were obtained without intravenous contrast.  CONTRAST:  69mL MULTIHANCE GADOBENATE DIMEGLUMINE 529 MG/ML IV SOLN  COMPARISON:  CT head without contrast 10/19/2013. MRI brain 02/18/2010.  FINDINGS: MRI HEAD FINDINGS  The study is moderately degraded throughout by patient motion. Moderate generalized atrophy is present. Mild periventricular and subcortical white matter changes are similar to the prior study. No acute infarct, hemorrhage, or mass lesion is present. The ventricles are proportionate to the degree of atrophy. There is no significant extra-axial fluid collection. The basal ganglia and brainstem is stable.  Flow is present in the major intracranial arteries. The patient is status post bilateral lens replacements. The globes and orbits are otherwise intact.  The postcontrast images demonstrate no pathologic enhancement.  MRI  CERVICAL SPINE FINDINGS  Normal signal is present in the cervical and upper thoracic spinal cord to the lowest imaged level, T4-5. Marrow signal is slightly heterogeneous without focal lesions. Vertebral body heights and alignment are maintained. The craniocervical junction is within normal limits.  Study is moderately degraded by patient motion.  C2-3: Mild foraminal narrowing bilaterally is due to uncovertebral disease.  C3-4: A broad-based disc osteophyte complex partially effaces the ventral CSF. Mild foraminal narrowing bilaterally is worse on the right.  C4-5: A broad-based disc osteophyte complex partially effaces the ventral CSF. Uncovertebral and facet disease is noted bilaterally. There is mild foraminal narrowing bilaterally.  C5-6: A broad-based disc osteophyte complex is present. Mild foraminal narrowing is present bilaterally due to uncovertebral disease.  C6-7: A mild disc osteophyte complex is present without significant stenosis.  C7-T1:  Negative.  IMPRESSION: 1. The study is moderately degraded by patient motion. 2. Mild atrophy and white matter disease is similar to the prior study. 3. No acute or focal lesion to explain the patient's symptoms. 4. Multilevel spondylosis of cervical spine with mild foraminal narrowing bilaterally at C3-4, C4-5, and at C5-6 due to uncovertebral spurring bilaterally. 5. No focal cord signal abnormality or significant central canal stenosis.   Electronically Signed  By: Lawrence Santiago M.D.   On: 08/29/2014 20:45    DISCHARGE EXAMINATION: Filed Vitals:   08/30/14 0522 08/30/14 0819 08/30/14 0838 08/30/14 1028  BP: 130/76  134/81 155/92  Pulse:   86 92  Temp:    97.8 F (36.6 C)  TempSrc:    Oral  Resp:   18 20  Height:      Weight:      SpO2:  94%  93%   General appearance: alert, cooperative, appears stated age and no distress Resp: clear to auscultation bilaterally. No wheezing. No crackles. Cardio: regular rate and rhythm, S1, S2 normal, no  murmur, click, rub or gallop GI: soft, non-tender; bowel sounds normal; no masses,  no organomegaly Extremities: edema minimal  DISPOSITION: Home with home health  Discharge Instructions    Call MD for:  difficulty breathing, headache or visual disturbances    Complete by:  As directed      Call MD for:  extreme fatigue    Complete by:  As directed      Call MD for:  persistant dizziness or light-headedness    Complete by:  As directed      Call MD for:  severe uncontrolled pain    Complete by:  As directed      Call MD for:  temperature >100.4    Complete by:  As directed      Diet Carb Modified    Complete by:  As directed      Discharge instructions    Complete by:  As directed   Neurontin has been stopped as it can cause tremors occasionally. Please talk to your doctor before resuming. Talk to your doctor about further testing for possible lung disease.     Discharge instructions    Complete by:  As directed   Please follow up with Dr. Einar Gip. ECHO report is not available as yet. Dr. Einar Gip can follow up on it. Please stop smoking.     Increase activity slowly    Complete by:  As directed            ALLERGIES: No Known Allergies  Current Discharge Medication List    START taking these medications   Details  fluticasone (FLOVENT HFA) 110 MCG/ACT inhaler Inhale 1 puff into the lungs 2 (two) times daily. Qty: 1 Inhaler, Refills: 3    levofloxacin (LEVAQUIN) 500 MG tablet Take 1 tablet (500 mg total) by mouth daily. For 6 more days Qty: 6 tablet, Refills: 0    predniSONE (DELTASONE) 20 MG tablet Take 3 tablets once daily for 4 days, then take 2 tablets once daily for 4 days, then take 1 tablet once daily for 4 days, then STOP. Qty: 24 tablet, Refills: 0      CONTINUE these medications which have CHANGED   Details  torsemide (DEMADEX) 20 MG tablet Take 1 tablet (20 mg total) by mouth daily. Qty: 30 tablet, Refills: 0      CONTINUE these medications which have NOT  CHANGED   Details  albuterol (PROVENTIL HFA;VENTOLIN HFA) 108 (90 BASE) MCG/ACT inhaler Inhale 2 puffs into the lungs every 4 (four) hours as needed for wheezing or shortness of breath.    aspirin EC 81 MG tablet Take 81 mg by mouth 2 (two) times daily.    diltiazem (DILACOR XR) 120 MG 24 hr capsule Take 120 mg by mouth at bedtime.    fenofibrate (TRICOR) 145 MG tablet Take 145 mg by mouth daily after breakfast.  lisinopril (PRINIVIL,ZESTRIL) 2.5 MG tablet Take 2.5 mg by mouth daily after breakfast.     metFORMIN (GLUCOPHAGE) 1000 MG tablet Take 1,000 mg by mouth 2 (two) times daily with a meal.    metoprolol (LOPRESSOR) 50 MG tablet Take 50 mg by mouth 2 (two) times daily.     nitroGLYCERIN (NITROSTAT) 0.4 MG SL tablet Place 0.4 mg under the tongue every 5 (five) minutes x 3 doses as needed for chest pain. For chest pain    pravastatin (PRAVACHOL) 40 MG tablet Take 40 mg by mouth at bedtime.     PRESCRIPTION MEDICATION Place 1 drop into both eyes 2 (two) times daily. Gen Teal eye drop    traMADol (ULTRAM) 50 MG tablet Take 50 mg by mouth every 8 (eight) hours as needed for severe pain.     triamcinolone cream (KENALOG) 0.1 % Apply 1 application topically daily. APPLY TO FEET      STOP taking these medications     fluticasone (FLONASE) 50 MCG/ACT nasal spray      gabapentin (NEURONTIN) 300 MG capsule        Follow-up Information    Follow up with Donnie Coffin, MD. Schedule an appointment as soon as possible for a visit in 1 week.   Specialty:  Family Medicine   Why:  post hospitalization follow up   Contact information:   [address] [place] [address] [phone]       Follow up with Laverda Page, MD.   Specialty:  Cardiology   Why:  as scheduled and for report of Echocardiogram.   Contact information:   [address] [place] [address] [phone]       TOTAL DISCHARGE TIME: 35 mins  [facility] Pager [phone]  08/30/2014, 12:02 PM

## 2014-08-30 NOTE — Progress Notes (Signed)
UR completed 

## 2014-08-31 LAB — URINE CULTURE
Colony Count: NO GROWTH
Culture: NO GROWTH

## 2014-09-01 NOTE — Progress Notes (Signed)
CARE MANAGEMENT NOTE 09/01/2014  Patient:  JAIMERE, FEUTZ   Account Number:  [id number]  Date Initiated:  08/30/2014  Documentation initiated by:  [facility]  Subjective/Objective Assessment:   tremor     Action/Plan:   Anticipated DC Date:  08/30/2014   Anticipated DC Plan:  [place]  CM consult      Choice offered to / List presented to:             Status of service:  Completed, signed off Medicare Important Message given?  NA - LOS <3 / Initial given by admissions (If response is "NO", the following Medicare IM given date fields will be blank) Date Medicare IM given:   Medicare IM given by:   Date Additional Medicare IM given:   Additional Medicare IM given by:    Discharge Disposition:  [place]  Per UR Regulation:    If discussed at Long Length of Stay Meetings, dates discussed:    Comments:  09/01/2014 1645 NCM spoke to pt and states he does not want HH at this time. States he has RW and cane and getting around ok at home. Jonnie Finner RN CCM Case Mgmt phone [phone]  08/30/2014 1450 NCM contacted pt to arrange [facility]. No answer. Will make attempt again to arrange [facility] PT. Jonnie Finner RN CCM Case Mgmt phone [phone]

## 2014-09-16 ENCOUNTER — Encounter (HOSPITAL_COMMUNITY): Payer: Self-pay | Admitting: General Practice

## 2014-09-16 ENCOUNTER — Inpatient Hospital Stay (HOSPITAL_COMMUNITY)
Admission: AD | Admit: 2014-09-16 | Discharge: 2014-09-19 | DRG: 293 | Disposition: A | Discharge status: Home / Self Care | Payer: Medicare Other | Source: Ambulatory Visit | Attending: Cardiology | Admitting: Cardiology

## 2014-09-16 DIAGNOSIS — M1990 Unspecified osteoarthritis, unspecified site: Secondary | ICD-10-CM | POA: Diagnosis present

## 2014-09-16 DIAGNOSIS — E780 Pure hypercholesterolemia: Secondary | ICD-10-CM | POA: Diagnosis present

## 2014-09-16 DIAGNOSIS — J449 Chronic obstructive pulmonary disease, unspecified: Secondary | ICD-10-CM | POA: Diagnosis present

## 2014-09-16 DIAGNOSIS — E1122 Type 2 diabetes mellitus with diabetic chronic kidney disease: Secondary | ICD-10-CM | POA: Diagnosis present

## 2014-09-16 DIAGNOSIS — E119 Type 2 diabetes mellitus without complications: Secondary | ICD-10-CM | POA: Diagnosis present

## 2014-09-16 DIAGNOSIS — Z7982 Long term (current) use of aspirin: Secondary | ICD-10-CM

## 2014-09-16 DIAGNOSIS — I482 Chronic atrial fibrillation: Secondary | ICD-10-CM | POA: Diagnosis present

## 2014-09-16 DIAGNOSIS — I2781 Cor pulmonale (chronic): Secondary | ICD-10-CM | POA: Diagnosis present

## 2014-09-16 DIAGNOSIS — I5033 Acute on chronic diastolic (congestive) heart failure: Secondary | ICD-10-CM | POA: Diagnosis present

## 2014-09-16 DIAGNOSIS — I2510 Atherosclerotic heart disease of native coronary artery without angina pectoris: Secondary | ICD-10-CM | POA: Diagnosis present

## 2014-09-16 DIAGNOSIS — I252 Old myocardial infarction: Secondary | ICD-10-CM

## 2014-09-16 DIAGNOSIS — E785 Hyperlipidemia, unspecified: Secondary | ICD-10-CM | POA: Diagnosis present

## 2014-09-16 DIAGNOSIS — Z951 Presence of aortocoronary bypass graft: Secondary | ICD-10-CM | POA: Diagnosis not present

## 2014-09-16 DIAGNOSIS — I129 Hypertensive chronic kidney disease with stage 1 through stage 4 chronic kidney disease, or unspecified chronic kidney disease: Secondary | ICD-10-CM | POA: Diagnosis present

## 2014-09-16 DIAGNOSIS — F17210 Nicotine dependence, cigarettes, uncomplicated: Secondary | ICD-10-CM | POA: Diagnosis present

## 2014-09-16 DIAGNOSIS — R0602 Shortness of breath: Secondary | ICD-10-CM

## 2014-09-16 DIAGNOSIS — N183 Chronic kidney disease, stage 3 (moderate): Secondary | ICD-10-CM | POA: Diagnosis present

## 2014-09-16 DIAGNOSIS — R0600 Dyspnea, unspecified: Secondary | ICD-10-CM | POA: Diagnosis present

## 2014-09-16 HISTORY — DX: Type 2 diabetes mellitus without complications: E11.9

## 2014-09-16 HISTORY — DX: Pure hypercholesterolemia, unspecified: E78.00

## 2014-09-16 HISTORY — DX: Heart failure, unspecified: I50.9

## 2014-09-16 HISTORY — DX: Unspecified osteoarthritis, unspecified site: M19.90

## 2014-09-16 LAB — CBC WITH DIFFERENTIAL/PLATELET
BASOS PCT: 0 % (ref 0–1)
Basophils Absolute: 0 10*3/uL (ref 0.0–0.1)
EOS ABS: 0 10*3/uL (ref 0.0–0.7)
Eosinophils Relative: 0 % (ref 0–5)
HCT: 47.1 % (ref 39.0–52.0)
HEMOGLOBIN: 16.4 g/dL (ref 13.0–17.0)
LYMPHS ABS: 1.2 10*3/uL (ref 0.7–4.0)
Lymphocytes Relative: 17 % (ref 12–46)
MCH: 31.2 pg (ref 26.0–34.0)
MCHC: 34.8 g/dL (ref 30.0–36.0)
MCV: 89.5 fL (ref 78.0–100.0)
Monocytes Absolute: 0.4 10*3/uL (ref 0.1–1.0)
Monocytes Relative: 6 % (ref 3–12)
NEUTROS PCT: 77 % (ref 43–77)
Neutro Abs: 5.6 10*3/uL (ref 1.7–7.7)
Platelets: UNDETERMINED 10*3/uL (ref 150–400)
RBC: 5.26 MIL/uL (ref 4.22–5.81)
RDW: 15.6 % — ABNORMAL HIGH (ref 11.5–15.5)
WBC: 7.2 10*3/uL (ref 4.0–10.5)

## 2014-09-16 LAB — GLUCOSE, CAPILLARY
Glucose-Capillary: 140 mg/dL — ABNORMAL HIGH (ref 70–99)
Glucose-Capillary: 127 mg/dL — ABNORMAL HIGH (ref 70–99)
Glucose-Capillary: 95 mg/dL (ref 70–99)

## 2014-09-16 LAB — COMPREHENSIVE METABOLIC PANEL
ALK PHOS: 29 U/L — AB (ref 39–117)
ALT: 19 U/L (ref 0–53)
AST: 19 U/L (ref 0–37)
Albumin: 3.4 g/dL — ABNORMAL LOW (ref 3.5–5.2)
Anion gap: 9 (ref 5–15)
BUN: 16 mg/dL (ref 6–23)
CO2: 24 mmol/L (ref 19–32)
Calcium: 8.7 mg/dL (ref 8.4–10.5)
Chloride: 101 mEq/L (ref 96–112)
Creatinine, Ser: 1.1 mg/dL (ref 0.50–1.35)
GFR, EST AFRICAN AMERICAN: 74 mL/min — AB (ref >90)
GFR, EST NON AFRICAN AMERICAN: 64 mL/min — AB (ref >90)
GLUCOSE: 170 mg/dL — AB (ref 70–99)
POTASSIUM: 3.9 mmol/L (ref 3.5–5.1)
SODIUM: 134 mmol/L — AB (ref 135–145)
Total Bilirubin: 0.9 mg/dL (ref 0.3–1.2)
Total Protein: 6.3 g/dL (ref 6.0–8.3)

## 2014-09-16 LAB — TSH: TSH: 1.466 u[IU]/mL (ref 0.350–4.500)

## 2014-09-16 MED ORDER — METOPROLOL TARTRATE 50 MG PO TABS
50.0000 mg | ORAL_TABLET | Freq: Two times a day (BID) | ORAL | Status: DC
  Administered 2014-09-16 – 2014-09-19 (×7): 50 mg via ORAL
  Filled 2014-09-16 (×8): qty 1

## 2014-09-16 MED ORDER — SODIUM CHLORIDE 0.9 % IJ SOLN
3.0000 mL | Freq: Two times a day (BID) | INTRAMUSCULAR | Status: DC
  Administered 2014-09-16 – 2014-09-18 (×5): 3 mL via INTRAVENOUS

## 2014-09-16 MED ORDER — ONDANSETRON HCL 4 MG/2ML IJ SOLN
4.0000 mg | Freq: Four times a day (QID) | INTRAMUSCULAR | Status: DC | PRN
Start: 2014-09-16 — End: 2014-09-19

## 2014-09-16 MED ORDER — FLUTICASONE PROPIONATE HFA 110 MCG/ACT IN AERO
1.0000 | INHALATION_SPRAY | Freq: Two times a day (BID) | RESPIRATORY_TRACT | Status: DC
  Administered 2014-09-16 – 2014-09-19 (×6): 1 via RESPIRATORY_TRACT
  Filled 2014-09-16: qty 12

## 2014-09-16 MED ORDER — SODIUM CHLORIDE 0.9 % IV SOLN: 250.0000 mL | INTRAVENOUS | Status: DC | PRN

## 2014-09-16 MED ORDER — NITROGLYCERIN 0.4 MG SL SUBL
0.4000 mg | SUBLINGUAL_TABLET | SUBLINGUAL | Status: DC | PRN
Start: 2014-09-16 — End: 2014-09-19

## 2014-09-16 MED ORDER — INSULIN ASPART 100 UNIT/ML ~~LOC~~ SOLN: 0.0000 [IU] | Freq: Three times a day (TID) | SUBCUTANEOUS | Status: DC

## 2014-09-16 MED ORDER — ALBUTEROL SULFATE (2.5 MG/3ML) 0.083% IN NEBU: 2.5000 mg | INHALATION_SOLUTION | RESPIRATORY_TRACT | Status: DC | PRN

## 2014-09-16 MED ORDER — ALBUTEROL SULFATE HFA 108 (90 BASE) MCG/ACT IN AERS: 2.0000 | INHALATION_SPRAY | RESPIRATORY_TRACT | Status: DC | PRN

## 2014-09-16 MED ORDER — ASPIRIN EC 81 MG PO TBEC
81.0000 mg | DELAYED_RELEASE_TABLET | Freq: Two times a day (BID) | ORAL | Status: DC
  Administered 2014-09-16 – 2014-09-19 (×7): 81 mg via ORAL
  Filled 2014-09-16 (×8): qty 1

## 2014-09-16 MED ORDER — METFORMIN HCL 500 MG PO TABS
1000.0000 mg | ORAL_TABLET | Freq: Two times a day (BID) | ORAL | Status: DC
  Administered 2014-09-16 – 2014-09-19 (×6): 1000 mg via ORAL
  Filled 2014-09-16 (×8): qty 2

## 2014-09-16 MED ORDER — FUROSEMIDE 10 MG/ML IJ SOLN
80.0000 mg | Freq: Two times a day (BID) | INTRAMUSCULAR | Status: DC
  Administered 2014-09-16 – 2014-09-17 (×2): 80 mg via INTRAVENOUS
  Filled 2014-09-16 (×4): qty 8

## 2014-09-16 MED ORDER — POTASSIUM CHLORIDE CRYS ER 10 MEQ PO TBCR
10.0000 meq | EXTENDED_RELEASE_TABLET | Freq: Three times a day (TID) | ORAL | Status: DC
  Administered 2014-09-16 – 2014-09-19 (×10): 10 meq via ORAL
  Filled 2014-09-16 (×12): qty 1

## 2014-09-16 MED ORDER — TRAMADOL HCL 50 MG PO TABS
50.0000 mg | ORAL_TABLET | Freq: Three times a day (TID) | ORAL | Status: DC | PRN
  Filled 2014-09-16: qty 1

## 2014-09-16 MED ORDER — HEPARIN SODIUM (PORCINE) 5000 UNIT/ML IJ SOLN
5000.0000 [IU] | Freq: Three times a day (TID) | INTRAMUSCULAR | Status: DC
  Administered 2014-09-16 – 2014-09-19 (×9): 5000 [IU] via SUBCUTANEOUS
  Filled 2014-09-16 (×10): qty 1

## 2014-09-16 MED ORDER — ACETAMINOPHEN 325 MG PO TABS: 650.0000 mg | ORAL_TABLET | ORAL | Status: DC | PRN

## 2014-09-16 MED ORDER — PRAVASTATIN SODIUM 40 MG PO TABS
40.0000 mg | ORAL_TABLET | Freq: Every day | ORAL | Status: DC
  Administered 2014-09-16 – 2014-09-18 (×3): 40 mg via ORAL
  Filled 2014-09-16 (×4): qty 1

## 2014-09-16 MED ORDER — FENOFIBRATE 54 MG PO TABS
54.0000 mg | ORAL_TABLET | Freq: Every day | ORAL | Status: DC
  Administered 2014-09-16 – 2014-09-19 (×4): 54 mg via ORAL
  Filled 2014-09-16 (×4): qty 1

## 2014-09-16 MED ORDER — LISINOPRIL 2.5 MG PO TABS
2.5000 mg | ORAL_TABLET | Freq: Every day | ORAL | Status: DC
  Administered 2014-09-17 – 2014-09-19 (×3): 2.5 mg via ORAL
  Filled 2014-09-16 (×4): qty 1

## 2014-09-16 MED ORDER — DILTIAZEM HCL ER 120 MG PO CP24
120.0000 mg | ORAL_CAPSULE | Freq: Every day | ORAL | Status: DC
  Administered 2014-09-16 – 2014-09-18 (×3): 120 mg via ORAL
  Filled 2014-09-16 (×4): qty 1

## 2014-09-16 MED ORDER — SODIUM CHLORIDE 0.9 % IJ SOLN: 3.0000 mL | INTRAMUSCULAR | Status: DC | PRN

## 2014-09-16 NOTE — Progress Notes (Signed)
Pt BLE weeping; abd pads and compression wrap use to wrap both legs. Will continue to monitor quietly. Family remains at bedside and call light within reach. Francis Gaines Kysen Wetherington RN.

## 2014-09-16 NOTE — Progress Notes (Signed)
Pt arrived to the unit as a direct admit from MD office at 1115. Pt A&O x4; new IV inserted to left forearm; pt oriented to the unit and room; family remains at bedside; pain assessed; vitals taken; telemetry applied and called in to [place]; pt in bed comfortably with call light within reach. Will continue to monitor pt quietly. Francis Gaines Chaunce Winkels RN.

## 2014-09-16 NOTE — H&P (Signed)
[name] is an 74 y.o. male.   Chief Complaint:  Leg edema and dyspnea HPI:  [name] is a 74 year old Caucasian male with history of HTN, hyperlipidemia, diabetes with CKD stage III, permanent atrial fibrillation, and known coronary artery disease and myocardial infarction, s/p CABG status. He was seen last week on 09/11/2014 for SOB, edema, and weight gain. He was started on Torsemide Inspra, patient presents to me stating that his leg edema has gotten worse, his right foot has started to weep, wanted to be evaluated on an urgent basis.  He was seen in the office today this morning and I recommended hospital admission.  He reports continued SOB and edema. He has gained 15 more lbs. He has resumed smoking. He admits to eating and drinking what ever he wanted over the holidays without regard to sodium or fluid intake.   He denies any palpitations, chest pain, PND or orthopnea. He could not tolerate any anticoagulation due to recurrent GI bleed and was recommended aspirin only by his gastroenterologist Dr. Lizbeth Bark. He was hospitalized in May 2015 for bleeding diverticula. This has since resolved and he denies any GI bleeding since discharge from hospital withouot recurrence. He does not want to be on anticoagulation due to fear of bleeding.  Past Medical History  Diagnosis Date  . CAD (coronary artery disease)   . Acute non-ST segment elevation myocardial infarction   . Left renal mass   . Diverticulitis   . HTN (hypertension)   . Myocardial infarction 06/19/2011  . Ulcer 1960's    "stomach"  . Renal neoplasm     LEFT  . Blood transfusion     "related to surgeries"  . Diverticulitis 1996  . CHF (congestive heart failure)   . High cholesterol   . Type II diabetes mellitus   . Arthritis     "hands" (09/16/2014)  . COPD (chronic obstructive pulmonary disease)     Past Surgical History  Procedure Laterality Date  . Coronary artery bypass graft  06/2011    4 VESSELS  .  Colon resection  2005    FOR DIVERTICULITIS  . Robot assisted laparoscopic nephrectomy  11/29/2011    Procedure: ROBOTIC ASSISTED LAPAROSCOPIC NEPHRECTOMY;  Surgeon: Dutch Gray, MD;  Location: [facility];  Service: Urology;  Laterality: Left;  PARTIAL left nephrectomy    . Cardioversion  01/11/2012    Procedure: CARDIOVERSION;  Surgeon: Laverda Page, MD;  Location: [place];  Service: Cardiovascular;  Laterality: N/A;  . Colonoscopy  02/02/2012    Procedure: COLONOSCOPY;  Surgeon: Jeryl Columbia, MD;  Location: [facility];  Service: Endoscopy;  Laterality: N/A;  magod /ja  . Cholecystectomy    . Hernia repair Bilateral X 4    "twice on each side"  . Colon surgery    . Cataract extraction w/ intraocular lens  implant, bilateral Bilateral     Family History  Problem Relation Age of Onset  . Heart disease Father   . Alzheimer's disease Mother   . Alcohol abuse Brother    Social History:  reports that he quit smoking about 3 years ago. His smoking use included Cigarettes. He has a 26 pack-year smoking history. He has never used smokeless tobacco. He reports that he does not drink alcohol or use illicit drugs.  Allergies: No Known Allergies  Medications Prior to Admission  Medication Sig Dispense Refill  . albuterol (PROVENTIL HFA;VENTOLIN HFA) 108 (90 BASE) MCG/ACT inhaler Inhale 2 puffs into the lungs every  4 (four) hours as needed for wheezing or shortness of breath.    Marland Kitchen aspirin EC 81 MG tablet Take 81 mg by mouth 2 (two) times daily.    Marland Kitchen diltiazem (DILACOR XR) 120 MG 24 hr capsule Take 120 mg by mouth at bedtime.    . fenofibrate (TRICOR) 145 MG tablet Take 145 mg by mouth daily after breakfast.     . fluticasone (FLOVENT HFA) 110 MCG/ACT inhaler Inhale 1 puff into the lungs 2 (two) times daily. 1 Inhaler 3  . levofloxacin (LEVAQUIN) 500 MG tablet Take 1 tablet (500 mg total) by mouth daily. For 6 more days 6 tablet 0  . lisinopril (PRINIVIL,ZESTRIL) 2.5 MG tablet Take 2.5 mg by mouth  daily after breakfast.     . metFORMIN (GLUCOPHAGE) 1000 MG tablet Take 1,000 mg by mouth 2 (two) times daily with a meal.    . metoprolol (LOPRESSOR) 50 MG tablet Take 50 mg by mouth 2 (two) times daily.     . nitroGLYCERIN (NITROSTAT) 0.4 MG SL tablet Place 0.4 mg under the tongue every 5 (five) minutes x 3 doses as needed for chest pain. For chest pain    . pravastatin (PRAVACHOL) 40 MG tablet Take 40 mg by mouth at bedtime.     . predniSONE (DELTASONE) 20 MG tablet Take 3 tablets once daily for 4 days, then take 2 tablets once daily for 4 days, then take 1 tablet once daily for 4 days, then STOP. 24 tablet 0  . PRESCRIPTION MEDICATION Place 1 drop into both eyes 2 (two) times daily. Gen Teal eye drop    . torsemide (DEMADEX) 20 MG tablet Take 1 tablet (20 mg total) by mouth daily. 30 tablet 0  . traMADol (ULTRAM) 50 MG tablet Take 50 mg by mouth every 8 (eight) hours as needed for severe pain.     Marland Kitchen triamcinolone cream (KENALOG) 0.1 % Apply 1 application topically daily. APPLY TO FEET        Review of Systems - weight gain of greater than 15 pounds in 2 weeks, diabetes well controlled, chronic shortness of breath slightly worse, no neurologic deficits, no chest pain or palpitations.  No history to suggest GI bleed.  Other systems negative.  Blood pressure 129/79, pulse 49, temperature 98.1 F (36.7 C), temperature source Oral, resp. rate 18, weight 102.331 kg (225 lb 9.6 oz), SpO2 95 %.   General: Well built and overweight body habitus who is in no acute distress. Appears stated age. Alert Ox3.   There is no cyanosis. HEENT: normal limits. JVD present.   CARDIAC EXAM: S1 Variable and split, S2 normal, no gallop present. No murmur.   CHEST EXAM: No tenderness of chest wall. LUNGS: Scattered rhonchi bilateral  ABDOMEN: Mild hepatomegaly and mild H-J reflux. BS normal in all 4 quadrants. Abdomen is non-tender.   EXTREMITY: Full range of movements, 4+ pitting bilateral below  knee leg edema, left foot blister noted. MUSCULOSKELETAL EXAM: Intact with full range of motion in all 4 extremities.   NEUROLOGIC EXAM: Grossly intact without any focal deficits. Alert O x 3.   VASCULAR EXAM: No skin breakdown. Carotids normal. Extremities: Femoral pulse distant. Popliteal pulse could not be felt; Pedal pulse could not be felt. Otherwise No obvious prominent pulse felt in the abdomen. No varicose veins  Results for orders placed or performed during the hospital encounter of 09/16/14 (from the past 48 hour(s))  Glucose, capillary     Status: Abnormal   Collection Time:  09/16/14 11:33 AM  Result Value Ref Range   Glucose-Capillary 140 (H) 70 - 99 mg/dL   Comment 1 Notify RN    Comment 2 Documented in Chart    No results found.  Labs:   Lab Results  Component Value Date   WBC 5.1 08/30/2014   HGB 16.5 08/30/2014   HCT 47.6 08/30/2014   MCV 88.3 08/30/2014   PLT 140* 08/30/2014   No results for input(s): NA, K, CL, CO2, BUN, CREATININE, CALCIUM, PROT, BILITOT, ALKPHOS, ALT, AST, GLUCOSE in the last 168 hours.  Invalid input(s): LABALBU Lab Results  Component Value Date   CKTOTAL 111 08/29/2014   CKMB 3.7 10/27/2011   TROPONINI <0.30 10/27/2011    Lipid Panel     Component Value Date/Time   CHOL 132 06/20/2011 0530   TRIG 147 06/20/2011 0530   HDL 27* 06/20/2011 0530   CHOLHDL 4.9 06/20/2011 0530   VLDL 29 06/20/2011 0530   LDLCALC 76 06/20/2011 0530    EKG 08/29/2014: Atrial fibrillation with controlled ventricular response at the rate of 87 bpm, rightward axis, right bundle branch block.  Cannot exclude inferior infarct old.  Echocardiogram 61/95/0932: LV systolic dysfunction mild to moderately reduced at 40-45% with inferior akinesis. Left atrium moderate to severely dilated, right atrium mildly dilated, poor echo window. IVC was dilated with blunted respiratory rate response. Suggests elevated right-sided pressure. No change  10/11/2013.  Outpatient Lexiscan stress 01/17/13; 1. Resting EKG shows A. Fibrillation with RVR @ 107/min. RBBB. Non specific ST-T change. PVC. Stress EKG was non diagnostic for ischemia. No ST-T changes of ischemia noted with pharmacologic stress testing. 2. The perfusion study demonstrated a large sized inferior and inferolateral scar from the base to the apex. There was no significant peri-infarct ischemia. Left ventricular ejection fraction was estimated to be 19%. However the EF estimation may be erroneous due to difficulty in QGS gating due to A. Fibrillation. This represents a low risk study. Clinical correlation recommended.   Assessment/Plan 1. Acute on chronic diastolic heart failure.  ProBNP 07/30/2014: 1294.  2. Atherosclerosis of native coronary artery of native heart without angina pectoris   3. Chronic atrial fibrillation  Not on anticogulation due to recurrent GI Bleed from AV malformations and colonic diverticula. Patient's choice.  4. Cor pulmonale (chronic) COPD and echo revealing RV dilatation.  5. Other hypervolemia   6. Diabetes mellitus with stage 2 chronic kidney disease.  Recommendation: Patient has 4+ leg edema, fluid overload state, patient is presently in acute decompensated heart failure and not responding therapy.  I will admit the patient for IV diuretics, make further recommendations after this.  Please see orders.  Laverda Page, MD 09/16/2014, 1:36 PM [facility] [place] Pager: [phone] Office: [phone] If no answer: Cell:  [phone]

## 2014-09-17 ENCOUNTER — Inpatient Hospital Stay (HOSPITAL_COMMUNITY): Payer: Medicare Other

## 2014-09-17 DIAGNOSIS — R609 Edema, unspecified: Secondary | ICD-10-CM

## 2014-09-17 LAB — GLUCOSE, CAPILLARY
GLUCOSE-CAPILLARY: 114 mg/dL — AB (ref 70–99)
Glucose-Capillary: 105 mg/dL — ABNORMAL HIGH (ref 70–99)
Glucose-Capillary: 111 mg/dL — ABNORMAL HIGH (ref 70–99)
Glucose-Capillary: 104 mg/dL — ABNORMAL HIGH (ref 70–99)

## 2014-09-17 LAB — BASIC METABOLIC PANEL
Anion gap: 14 (ref 5–15)
BUN: 14 mg/dL (ref 6–23)
CHLORIDE: 103 meq/L (ref 96–112)
CO2: 21 mmol/L (ref 19–32)
CREATININE: 1.13 mg/dL (ref 0.50–1.35)
Calcium: 8.8 mg/dL (ref 8.4–10.5)
GFR calc Af Amer: 71 mL/min — ABNORMAL LOW (ref >90)
GFR calc non Af Amer: 62 mL/min — ABNORMAL LOW (ref >90)
Glucose, Bld: 109 mg/dL — ABNORMAL HIGH (ref 70–99)
Potassium: 3.8 mmol/L (ref 3.5–5.1)
Sodium: 138 mmol/L (ref 135–145)

## 2014-09-17 LAB — BRAIN NATRIURETIC PEPTIDE: B NATRIURETIC PEPTIDE 5: 113.1 pg/mL — AB (ref 0.0–100.0)

## 2014-09-17 MED ORDER — FUROSEMIDE 10 MG/ML IJ SOLN
80.0000 mg | Freq: Three times a day (TID) | INTRAMUSCULAR | Status: DC
  Administered 2014-09-17 (×2): 80 mg via INTRAVENOUS
  Filled 2014-09-17 (×3): qty 8

## 2014-09-17 MED ORDER — SPIRONOLACTONE 50 MG PO TABS
50.0000 mg | ORAL_TABLET | Freq: Every day | ORAL | Status: DC
  Administered 2014-09-17 – 2014-09-18 (×2): 50 mg via ORAL
  Filled 2014-09-17 (×3): qty 1

## 2014-09-17 NOTE — Progress Notes (Signed)
Utilization review completed. Vanya Carberry, RN, BSN. 

## 2014-09-17 NOTE — Progress Notes (Addendum)
Subjective:  [name] is improving some.  Legs are wrapped and elevated due to weeping. He has lost ~16lbs in fluid since admission.  Daughter at bedside.      Objective:  Vital Signs in the last 24 hours: Temp:  [97.3 F (36.3 C)-98.1 F (36.7 C)] 97.3 F (36.3 C) (01/05 1029) Pulse Rate:  [49-106] 89 (01/05 1029) Resp:  [18] 18 (01/05 1029) BP: (110-129)/(71-79) 129/71 mmHg (01/05 1029) SpO2:  [95 %-99 %] 99 % (01/05 1029) Weight:  [95.074 kg (209 lb 9.6 oz)-102.331 kg (225 lb 9.6 oz)] 95.074 kg (209 lb 9.6 oz) (01/05 0500)  Intake/Output from previous day: 01/04 0701 - 01/05 0700 In: 240 [P.O.:240] Out: 1450 [Urine:1450]  Physical Exam:  General: Well built and overweight body habitus who isin no acute distress. Appearsstated age. Alert Ox3.   There is no cyanosis. HEENT: normal limits. JVD present.   CARDIAC EXAM: S1 Variable and split, S2 normal, no gallop present. No murmur.   CHEST EXAM: No tenderness of chest wall. LUNGS: Scattered rhonchi bilateral  ABDOMEN: Mild hepatomegaly and mild H-J reflux. BS normal in all 4 quadrants. Abdomen is non-tender.   EXTREMITY: Full range of movements, 3+ pitting bilateral below knee leg [name], legs wrapped. Right leg appears larger than left, right foot reddened. MUSCULOSKELETAL EXAM: Intact with full range of motion in all 4 extremities.   NEUROLOGIC EXAM: Grossly intact without any focal deficits. Alert O x 3.   VASCULAR EXAM: No skin breakdown. Carotids normal. Extremities: Femoral pulse distant. Popliteal pulse could not be felt; Pedal pulse could not be felt. Otherwise No obvious prominent pulse felt in the abdomen. No varicose veins    Lab Results: BMP  Recent Labs  08/30/14 0556 09/16/14 1250 09/17/14 0549  NA 137 134* 138  K 3.6* 3.9 3.8  CL 97 101 103  CO2 25 24 21   GLUCOSE 180* 170* 109*  BUN 20 16 14   CREATININE 1.02 1.10 1.13  CALCIUM 9.4 8.7 8.8  GFRNONAA 70* 64* 62*  GFRAA 81* 74* 71*     CBC  Recent Labs Lab 09/16/14 1250  WBC 7.2  RBC 5.26  HGB 16.4  HCT 47.1  PLT PLATELET CLUMPS NOTED ON SMEAR, UNABLE TO ESTIMATE  MCV 89.5  MCH 31.2  MCHC 34.8  RDW 15.6*  LYMPHSABS 1.2  MONOABS 0.4  EOSABS 0.0  BASOSABS 0.0    HEMOGLOBIN A1C Lab Results  Component Value Date   HGBA1C 6.9* 08/29/2014   MPG 151* 08/29/2014    Cardiac Panel (last 3 results)  Recent Labs  08/29/14 1539  CKTOTAL 111    BNP (last 3 results)  Recent Labs  08/29/14 1803 08/29/14 2140  PROBNP 1356.0* 1294.0*    TSH  Recent Labs  08/29/14 1803 08/29/14 2140 09/16/14 1250  TSH 1.690 0.993 1.466    CHOLESTEROL No results for input(s): CHOL in the last 8760 hours.  Hepatic Function Panel  Recent Labs  08/29/14 1539 08/29/14 1808 09/16/14 1250  PROT 7.8 8.2 6.3  ALBUMIN 3.9 4.2 3.4*  AST 27 24 19   ALT 16 17 19   ALKPHOS 36* 39 29*  BILITOT 0.6 0.6 0.9  BILIDIR  --  <0.2  --   IBILI  --  NOT CALCULATED  --      CXR COMPARISON: 08/29/2014. 01/16/2014: IMPRESSION: 1. Prior CABG. Stable cardiomegaly. 2. No pulmonary venous congestion and today's exam. 3. Right pleural scarring.  Cardiac Studies:  EKG 08/29/2014: Atrial fibrillation with controlled ventricular response at  the rate of 87 bpm, rightward axis, right bundle branch block. Cannot exclude inferior infarct old.  Echocardiogram 16/06/9603: LV systolic dysfunction mild to moderately reduced at 40-45% with inferior akinesis. Left atrium moderate to severely dilated, right atrium mildly dilated, poor echo window. IVC was dilated with blunted respiratory rate response. Suggests elevated right-sided pressure. No change 10/11/2013.  Outpatient Lexiscan stress 01/17/13; 1. Resting EKG shows A. Fibrillation with RVR @ 107/min. RBBB. Non specific ST-T change. PVC. Stress EKG was non diagnostic for ischemia. No ST-T changes of ischemia noted with pharmacologic stress testing. 2. The perfusion study  demonstrated a large sized inferior and inferolateral scar from the base to the apex. There was no significant peri-infarct ischemia. Left ventricular ejection fraction was estimated to be 19%. However the EF estimation may be erroneous due to difficulty in QGS gating due to A. Fibrillation. This represents a low risk study. Clinical correlation recommended.  Assessment/Plan:  1. Acute on chronic diastolic heart failure: BNP 09/17/2014  113.1. Continue present therapy.  2. Atherosclerosis of native coronary artery of native heart without angina pectoris   3. Chronic atrial fibrillation: Not on anticogulation due to recurrent GI Bleed from AV malformations and colonic diverticula. Patient's choice. CHA2DS2-VASCScore: Risk Score  6,  Yearly risk of stroke  9.8. Recommendation: ASA No/Anticoagulation Yes  [place] Has Bled: Score 2.  Estimated risk of major bleeding at 1 year with [place] 1.88 to 3.2. Patient prefers ASA only.   4. Cor pulmonale (acute on chronic) COPD and echo revealing RV dilatation.  5. Other hypervolemia   6. Diabetes mellitus with stage 2 chronic kidney disease: would benefit from wound care consult due to risk for poor wound healing on legs.   Rachel Bo, NP-C 09/17/2014, 10:59 AM [facility] Pager: [phone] Office: [phone]  I have personally reviewed the patient's record and performed physical exam and agree with the assessment and plan of Ms. Neldon Labella, NP-C.  Laverda Page, MD [facility] [place] Pager: [phone].   Office: [phone] If no answer: Mobile: [phone]

## 2014-09-17 NOTE — Progress Notes (Signed)
VASCULAR LAB PRELIMINARY  PRELIMINARY  PRELIMINARY  PRELIMINARY  Bilateral lower extremity venous duplex  completed.    Preliminary report:  Bilateral:  No evidence of DVT, superficial thrombosis, or Baker's Cyst.  Calves not imaged due to bandages.    Kimberli Winne, RVT 09/17/2014, 4:10 PM

## 2014-09-18 LAB — GLUCOSE, CAPILLARY
Glucose-Capillary: 104 mg/dL — ABNORMAL HIGH (ref 70–99)
Glucose-Capillary: 122 mg/dL — ABNORMAL HIGH (ref 70–99)
Glucose-Capillary: 96 mg/dL (ref 70–99)

## 2014-09-18 LAB — BASIC METABOLIC PANEL
ANION GAP: 15 (ref 5–15)
BUN: 17 mg/dL (ref 6–23)
CO2: 25 mmol/L (ref 19–32)
Calcium: 9 mg/dL (ref 8.4–10.5)
Chloride: 99 mEq/L (ref 96–112)
Creatinine, Ser: 1.36 mg/dL — ABNORMAL HIGH (ref 0.50–1.35)
GFR calc Af Amer: 57 mL/min — ABNORMAL LOW (ref >90)
GFR calc non Af Amer: 49 mL/min — ABNORMAL LOW (ref >90)
Glucose, Bld: 106 mg/dL — ABNORMAL HIGH (ref 70–99)
POTASSIUM: 3.7 mmol/L (ref 3.5–5.1)
SODIUM: 139 mmol/L (ref 135–145)

## 2014-09-18 LAB — BRAIN NATRIURETIC PEPTIDE: B Natriuretic Peptide: 88.4 pg/mL (ref 0.0–100.0)

## 2014-09-18 MED ORDER — TORSEMIDE 20 MG PO TABS
40.0000 mg | ORAL_TABLET | Freq: Every day | ORAL | Status: DC
  Filled 2014-09-18: qty 2

## 2014-09-18 MED ORDER — TORSEMIDE 100 MG PO TABS
100.0000 mg | ORAL_TABLET | Freq: Every day | ORAL | Status: DC
  Administered 2014-09-18: 100 mg via ORAL
  Filled 2014-09-18: qty 1

## 2014-09-18 MED ORDER — TRIAMCINOLONE ACETONIDE 0.1 % EX CREA
TOPICAL_CREAM | Freq: Two times a day (BID) | CUTANEOUS | Status: DC
  Administered 2014-09-18: 1 via TOPICAL
  Administered 2014-09-18: 10:00:00 via TOPICAL
  Filled 2014-09-18: qty 15

## 2014-09-18 NOTE — Progress Notes (Signed)
Subjective:  Edema continues to improve.  Legs are wrapped due to weeping. Pt states someone came by to assess wounds briefly yesterday.  Weight down 6.5 more lbs. No SOB or chest pain. He states he is ready to go home. Daughter at bedside.      Objective:  Vital Signs in the last 24 hours: Temp:  [97.3 F (36.3 C)-98.1 F (36.7 C)] 98.1 F (36.7 C) (01/06 0358) Pulse Rate:  [73-93] 73 (01/06 0358) Resp:  [18] 18 (01/06 0358) BP: (113-129)/(59-71) 124/59 mmHg (01/06 0358) SpO2:  [96 %-99 %] 96 % (01/06 0358)  Intake/Output from previous day: 01/05 0701 - 01/06 0700 In: 600 [P.O.:600] Out: 2450 [Urine:2450]  Physical Exam:  General: Well built and overweight body habitus who isin no acute distress. Appearsstated age. Alert Ox3.   There is no cyanosis. HEENT: normal limits. JVD present.   CARDIAC EXAM: S1 Variable and split, S2 normal, no gallop present. No murmur.   CHEST EXAM: No tenderness of chest wall. LUNGS: Scattered rhonchi bilateral  ABDOMEN: BS normal in all 4 quadrants. Abdomen is non-tender.   EXTREMITY: Full range of movements, 2+ pitting bilateral below knee leg edema (improved from 4plus). Right leg appears larger than left, right foot reddened. No open weeping wound. MUSCULOSKELETAL EXAM: Intact with full range of motion in all 4 extremities.   NEUROLOGIC EXAM: Grossly intact without any focal deficits. Alert O x 3.   VASCULAR EXAM: No skin breakdown. Carotids normal. Extremities: Femoral pulse distant. Popliteal pulse could not be felt; Pedal pulse could not be felt. Otherwise No obvious prominent pulse felt in the abdomen. No varicose veins    Lab Results: BMP  Recent Labs  09/16/14 1250 09/17/14 0549 09/18/14 0500  NA 134* 138 139  K 3.9 3.8 3.7  CL 101 103 99  CO2 24 21 25   GLUCOSE 170* 109* 106*  BUN 16 14 17   CREATININE 1.10 1.13 1.36*  CALCIUM 8.7 8.8 9.0  GFRNONAA 64* 62* 49*  GFRAA 74* 71* 57*    CBC  Recent Labs Lab  09/16/14 1250  WBC 7.2  RBC 5.26  HGB 16.4  HCT 47.1  PLT PLATELET CLUMPS NOTED ON SMEAR, UNABLE TO ESTIMATE  MCV 89.5  MCH 31.2  MCHC 34.8  RDW 15.6*  LYMPHSABS 1.2  MONOABS 0.4  EOSABS 0.0  BASOSABS 0.0    HEMOGLOBIN A1C Lab Results  Component Value Date   HGBA1C 6.9* 08/29/2014   MPG 151* 08/29/2014    Cardiac Panel (last 3 results)  Recent Labs  08/29/14 1539  CKTOTAL 111    BNP (last 3 results)  Recent Labs  08/29/14 1803 08/29/14 2140  PROBNP 1356.0* 1294.0*    TSH  Recent Labs  08/29/14 1803 08/29/14 2140 09/16/14 1250  TSH 1.690 0.993 1.466    CHOLESTEROL No results for input(s): CHOL in the last 8760 hours.  Hepatic Function Panel  Recent Labs  08/29/14 1539 08/29/14 1808 09/16/14 1250  PROT 7.8 8.2 6.3  ALBUMIN 3.9 4.2 3.4*  AST 27 24 19   ALT 16 17 19   ALKPHOS 36* 39 29*  BILITOT 0.6 0.6 0.9  BILIDIR  --  <0.2  --   IBILI  --  NOT CALCULATED  --      CXR COMPARISON: 08/29/2014. 01/16/2014: IMPRESSION: 1. Prior CABG. Stable cardiomegaly. 2. No pulmonary venous congestion and today's exam. 3. Right pleural scarring.  Cardiac Studies:  EKG 08/29/2014: Atrial fibrillation with controlled ventricular response at the rate of 87  bpm, rightward axis, right bundle branch block. Cannot exclude inferior infarct old.  Echocardiogram 93/81/0175: LV systolic dysfunction mild to moderately reduced at 40-45% with inferior akinesis. Left atrium moderate to severely dilated, right atrium mildly dilated, poor echo window. IVC was dilated with blunted respiratory rate response. Suggests elevated right-sided pressure. No change 10/11/2013.  Outpatient Lexiscan stress 01/17/13; 1. Resting EKG shows A. Fibrillation with RVR @ 107/min. RBBB. Non specific ST-T change. PVC. Stress EKG was non diagnostic for ischemia. No ST-T changes of ischemia noted with pharmacologic stress testing. 2. The perfusion study demonstrated a large sized inferior and  inferolateral scar from the base to the apex. There was no significant peri-infarct ischemia. Left ventricular ejection fraction was estimated to be 19%. However the EF estimation may be erroneous due to difficulty in QGS gating due to A. Fibrillation. This represents a low risk study. Clinical correlation recommended.  LE venous duplex 09/17/2014: No DVT.  Assessment/Plan:  1. Acute on chronic diastolic heart failure: BNP 09/17/2014  113.1.  Edema improving. Change IV Lasix to PO demadex to ensure continued diuresis on PO medications before discharge.   2. Atherosclerosis of native coronary artery of native heart without angina pectoris   3. Chronic atrial fibrillation: Not on anticogulation due to recurrent GI Bleed from AV malformations and colonic diverticula. Patient's choice. CHA2DS2-VASCScore: Risk Score  6,  Yearly risk of stroke  9.8. Recommendation: ASA No/Anticoagulation Yes  [place] Has Bled: Score 2.  Estimated risk of major bleeding at 1 year with [place] 1.88 to 3.2. Patient prefers ASA only.   4. Cor pulmonale (acute on chronic) COPD and echo revealing RV dilatation.  5. Other hypervolemia   6. Diabetes mellitus with stage 2 chronic kidney disease: wound care consulted due to risk for poor wound healing on legs. Creatinine trending up, currently 1.36, will continue to monitor.  Rec: Plan for discharge home in the morning if pt and labs remain stable.   Rachel Bo, NP-C 09/18/2014, 8:21 AM [facility] Pager: [phone] Office: [phone] I have personally reviewed the patient's record and performed physical exam and agree with the assessment and plan of Ms. Neldon Labella, NP-C.  Laverda Page, MD [facility] [place] Pager: [phone].   Office: [phone] If no answer: Mobile: [phone]

## 2014-09-18 NOTE — Consult Note (Signed)
WOC wound consult note Reason for Consult: weeping legs.  Pts daughter in the room, pt very sleepy did not really participate in our conversation. Daughter reports weeping and "cracking" of the legs when he had much more edema. This is a problem for him.  Based on the appearance of the legs and feet the edema does seem to be improved. Palpable pulses bilaterally. No open wounds, some small scabbed areas.  No active blistering.  Hemosiderin staining bilaterally.  Wound type: no open wounds or active weeping at the time of my assessment.  He does have his legs elevated and has not been up out of bed yet this am.  Dressing procedure/placement/frequency: Cardiology has added  triamcinolone cream (KENALOG) 0.1 % for the legs. Continue kerlix and ACE wraps to manage any weeping.   I explained the need for long term compression stockings to manage his LE edema and the daughter verbalized understanding of the need to have him fitted for stockings. I have suggested [facility] since they can measure this patient and fit patient appropriately.    If medical/cardiology team agrees with plan for long term compression stockings to manage this patient's LE edema would request patient be provided a script for 20-30 mmHG (low level) compression stockings at the time of DC.  Discussed POC with patient and bedside nurse.  Re consult if needed, will not follow at this time. Thanks  Kamyah Wilhelmsen [place], [place] [phone])

## 2014-09-19 LAB — BASIC METABOLIC PANEL
Anion gap: 10 (ref 5–15)
BUN: 22 mg/dL (ref 6–23)
CO2: 24 mmol/L (ref 19–32)
Calcium: 8.8 mg/dL (ref 8.4–10.5)
Chloride: 102 mEq/L (ref 96–112)
Creatinine, Ser: 1.46 mg/dL — ABNORMAL HIGH (ref 0.50–1.35)
GFR calc Af Amer: 52 mL/min — ABNORMAL LOW (ref >90)
GFR, EST NON AFRICAN AMERICAN: 45 mL/min — AB (ref >90)
Glucose, Bld: 94 mg/dL (ref 70–99)
Potassium: 3.9 mmol/L (ref 3.5–5.1)
Sodium: 136 mmol/L (ref 135–145)

## 2014-09-19 LAB — GLUCOSE, CAPILLARY
GLUCOSE-CAPILLARY: 92 mg/dL (ref 70–99)
Glucose-Capillary: 104 mg/dL — ABNORMAL HIGH (ref 70–99)

## 2014-09-19 LAB — BRAIN NATRIURETIC PEPTIDE: B NATRIURETIC PEPTIDE 5: 75.9 pg/mL (ref 0.0–100.0)

## 2014-09-19 MED ORDER — TRAZODONE 25 MG HALF TABLET
25.0000 mg | ORAL_TABLET | Freq: Once | ORAL | Status: AC
  Administered 2014-09-19: 25 mg via ORAL
  Filled 2014-09-19: qty 1

## 2014-09-19 NOTE — Discharge Summary (Signed)
Physician Discharge Summary  Patient ID: [name] MRN: [id number] DOB/AGE: July 15, 1941 74 y.o.  Admit date: 09/16/2014 Discharge date: 09/19/2014  Primary Discharge Diagnosis: Acute on chronic diastolic HF. Secondary Discharge Diagnosis:  2. Atherosclerosis of native coronary artery of native heart without angina pectoris S/P CABG:  H/O  NSTEMI S/P CABGs on 06/28/2011.   LIMA to LAD, SVG to circumflex, sequential SVG to PDA and PL of RCA. Ceasar Mons, MD   3. 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 Patient's choice.   4. Cor pulmonale (chronic) COPD and echo revealing RV dilatation.   5. Other hypervolemia   6. Diabetes mellitus with stage 3 chronic kidney disease.  7. Acute renal faiilure, due to aggressive diuresis.  Significant Diagnostic Studies:  Lower extremity venous duplex 09/17/2014: No evidence of DVT involving the visualized veins of bilateral lower extremities.   Echocardiogram 35/00/9381: LV systolic dysfunction mild to moderately reduced at 40-45% with inferior akinesis. Left atrium moderate to severely dilated, right atrium mildly dilated, poor echo window. IVC was dilated with blunted respiratory rate response. Suggests elevated right-sided pressure. No change 10/11/2013.  Out Patient studies:   EKG 08/29/2014: Atrial fibrillation with controlled ventricular response at the rate of 87 bpm, rightward axis, right bundle branch block. Cannot exclude inferior infarct old.  Lexiscan stress 01/17/13; 1. Resting EKG shows A. Fibrillation with RVR @ 107/min. RBBB. Non specific ST-T change. PVC. Stress EKG was non diagnostic for ischemia. No ST-T changes of ischemia noted with pharmacologic stress testing. 2. The perfusion study demonstrated a large sized inferior and inferolateral scar from the base to the apex. There was no significant peri-infarct ischemia. Left ventricular ejection fraction  was estimated to be 19%. However the EF estimation may be erroneous due to difficulty in QGS gating due to A. Fibrillation. This represents a low risk study. Clinical correlation recommended.  Consults: Wound care consulted due to loss of skin integrity on lower extremities due to edema and risk for impaired wound healing secondary to diabetes and decreased renal function.  Hospital Course: Patient is a 74 year old Caucasian male with a history of hypertension, hyperlipidemia, diabetes with CKD stage III, permanent atrial fibrillation, and coronary artery disease. He had presented to the office multiple times for shortness of breath, lower extremity edema, and weight gain.  He had failed outpatient treatment and was thus admitted to the hospital on 09/16/2014 for IV diuretics.  He had lower extremity venous duplex due to increased swelling and redness in the right leg which was negative for DVT.  He responded very well to IV diuretics and strict control of diet while in the hospital.  He remained stable today and lower extremity edema has significantly improved and only trace edema is left.  He has lost a total of 25 lbs in fluid since admission.     Recommendations on discharge: He was determined to be stable for discharge home with strict instructions to limit sodium and fluid intake.  Order for compression stockings faxed to [facility].  He is scheduled for outpatient follow-up with Dr. Einar Gip next week with repeat BNP and BMP the day before his follow up.  Smoking cessation again stressed. Daughter present at bedside. Suspect S. Cr will improve on the outpatient basis and acute worsening is due to aggressive diuresis.  Discharge Exam: Filed Vitals:   09/19/14 0428  BP: 122/70  Pulse: 99  Temp: 98.5 F (36.9 C)  Resp:  18   Wt Readings from Last 3 Encounters:  09/19/14 90.81 kg (200 lb 3.2 oz)  08/30/14 96.299 kg (212 lb 4.8 oz)  08/29/14 98.431 kg (217 lb)     General appearance: alert,  cooperative and no distress Neck: no adenopathy, no carotid bruit, no JVD, supple, symmetrical, trachea midline and thyroid not enlarged, symmetric, no tenderness/mass/nodules Resp: clear to auscultation bilaterally Cardio: irregularly irregular rhythm, S1: variable, S2: normal, no S3 or S4, no click and no rub GI: soft, non-tender; bowel sounds normal; no masses,  no organomegaly and no hepatomegaly, H-J reflux no longer present Extremities: edema trace bilaterally, dark pigmentation noted to bilateral lower legs, skin intact Pulses: 2+ and symmetric   Labs:   Lab Results  Component Value Date   WBC 7.2 09/16/2014   HGB 16.4 09/16/2014   HCT 47.1 09/16/2014   MCV 89.5 09/16/2014   PLT PLATELET CLUMPS NOTED ON SMEAR, UNABLE TO ESTIMATE 09/16/2014    Recent Labs Lab 09/16/14 1250  09/19/14 0438  NA 134*  < > 136  K 3.9  < > 3.9  CL 101  < > 102  CO2 24  < > 24  BUN 16  < > 22  CREATININE 1.10  < > 1.46*  CALCIUM 8.7  < > 8.8  PROT 6.3  --   --   BILITOT 0.9  --   --   ALKPHOS 29*  --   --   ALT 19  --   --   AST 19  --   --   GLUCOSE 170*  < > 94  < > = values in this interval not displayed. Lab Results  Component Value Date   CKTOTAL 111 08/29/2014   CKMB 3.7 10/27/2011   TROPONINI <0.30 10/27/2011    Lipid Panel     Component Value Date/Time   CHOL 132 06/20/2011 0530   TRIG 147 06/20/2011 0530   HDL 27* 06/20/2011 0530   CHOLHDL 4.9 06/20/2011 0530   VLDL 29 06/20/2011 0530   LDLCALC 76 06/20/2011 0530    EKG: Telemetry monitor reveals a.fib with moderate VR, no change from previous.    Radiology: Dg Chest 2 View  09/17/2014   CLINICAL DATA:  Shortness of breath.  EXAM: CHEST  2 VIEW  COMPARISON:  08/29/2014.  01/16/2014.  FINDINGS: Mediastinum and hilar structures are normal. Stable cardiomegaly. Prior CABG. No pulmonary venous congestion. No focal pulmonary infiltrate. Right pleural scarring. No pneumothorax. No acute osseus abnormality.  IMPRESSION: 1.  Prior CABG.  Stable cardiomegaly. 2. No pulmonary venous congestion and today's exam. 3. Right pleural scarring.   Electronically Signed   By: Marcello Moores  Register   On: 09/17/2014 07:46   Dg Chest 2 View  08/29/2014   CLINICAL DATA:  Shaking for 2 days.  EXAM: CHEST  2 VIEW  COMPARISON:  08/26/2014  FINDINGS: Sternotomy wires unchanged. Lungs are adequately inflated with minimal prominence of the perihilar markings. No focal consolidation or effusion. Mild stable cardiomegaly. Remainder of the exam is unchanged.  IMPRESSION: Possible mild vascular congestion.  Mild stable cardiomegaly.   Electronically Signed   By: Marin Olp M.D.   On: 08/29/2014 17:35   Dg Chest 2 View  08/26/2014   CLINICAL DATA:  Wheezing.  Shortness of breath.  EXAM: CHEST  2 VIEW  COMPARISON:  01/16/2014 .  FINDINGS: Mediastinum and hilar structures are normal. Prior CABG. Cardiomegaly with mild pulmonary vascular prominence and interstitial prominence. Small right pleural effusion cannot  be excluded. These findings suggest mild congestive heart failure. No pneumothorax. No acute osseus abnormality.  IMPRESSION: Mild congestive heart failure with mild interstitial edema and small right pleural effusion. Mild interstitial pneumonitis cannot be excluded.   Electronically Signed   By: Marcello Moores  Register   On: 08/26/2014 13:00   Mr Jeri Cos Wo Contrast  08/29/2014   CLINICAL DATA:  Tremors. New onset of shaking movements in his hands and head beginning yesterday.  EXAM: MRI HEAD WITHOUT AND WITH CONTRAST  MRI CERVICAL SPINE WITHOUT CONTRAST  TECHNIQUE: Multiplanar, multiecho pulse sequences of the brain and surrounding structures, and cervical spine, to include the craniocervical junction and cervicothoracic junction, were obtained without intravenous contrast.  CONTRAST:  64mL MULTIHANCE GADOBENATE DIMEGLUMINE 529 MG/ML IV SOLN  COMPARISON:  CT head without contrast 10/19/2013. MRI brain 02/18/2010.  FINDINGS: MRI HEAD FINDINGS  The  study is moderately degraded throughout by patient motion. Moderate generalized atrophy is present. Mild periventricular and subcortical white matter changes are similar to the prior study. No acute infarct, hemorrhage, or mass lesion is present. The ventricles are proportionate to the degree of atrophy. There is no significant extra-axial fluid collection. The basal ganglia and brainstem is stable.  Flow is present in the major intracranial arteries. The patient is status post bilateral lens replacements. The globes and orbits are otherwise intact.  The postcontrast images demonstrate no pathologic enhancement.  MRI CERVICAL SPINE FINDINGS  Normal signal is present in the cervical and upper thoracic spinal cord to the lowest imaged level, T4-5. Marrow signal is slightly heterogeneous without focal lesions. Vertebral body heights and alignment are maintained. The craniocervical junction is within normal limits.  Study is moderately degraded by patient motion.  C2-3: Mild foraminal narrowing bilaterally is due to uncovertebral disease.  C3-4: A broad-based disc osteophyte complex partially effaces the ventral CSF. Mild foraminal narrowing bilaterally is worse on the right.  C4-5: A broad-based disc osteophyte complex partially effaces the ventral CSF. Uncovertebral and facet disease is noted bilaterally. There is mild foraminal narrowing bilaterally.  C5-6: A broad-based disc osteophyte complex is present. Mild foraminal narrowing is present bilaterally due to uncovertebral disease.  C6-7: A mild disc osteophyte complex is present without significant stenosis.  C7-T1:  Negative.  IMPRESSION: 1. The study is moderately degraded by patient motion. 2. Mild atrophy and white matter disease is similar to the prior study. 3. No acute or focal lesion to explain the patient's symptoms. 4. Multilevel spondylosis of cervical spine with mild foraminal narrowing bilaterally at C3-4, C4-5, and at C5-6 due to uncovertebral  spurring bilaterally. 5. No focal cord signal abnormality or significant central canal stenosis.   Electronically Signed   By: Lawrence Santiago M.D.   On: 08/29/2014 20:45   Mr Cervical Spine Wo Contrast  08/29/2014   CLINICAL DATA:  Tremors. New onset of shaking movements in his hands and head beginning yesterday.  EXAM: MRI HEAD WITHOUT AND WITH CONTRAST  MRI CERVICAL SPINE WITHOUT CONTRAST  TECHNIQUE: Multiplanar, multiecho pulse sequences of the brain and surrounding structures, and cervical spine, to include the craniocervical junction and cervicothoracic junction, were obtained without intravenous contrast.  CONTRAST:  2mL MULTIHANCE GADOBENATE DIMEGLUMINE 529 MG/ML IV SOLN  COMPARISON:  CT head without contrast 10/19/2013. MRI brain 02/18/2010.  FINDINGS: MRI HEAD FINDINGS  The study is moderately degraded throughout by patient motion. Moderate generalized atrophy is present. Mild periventricular and subcortical white matter changes are similar to the prior study. No acute infarct, hemorrhage, or  mass lesion is present. The ventricles are proportionate to the degree of atrophy. There is no significant extra-axial fluid collection. The basal ganglia and brainstem is stable.  Flow is present in the major intracranial arteries. The patient is status post bilateral lens replacements. The globes and orbits are otherwise intact.  The postcontrast images demonstrate no pathologic enhancement.  MRI CERVICAL SPINE FINDINGS  Normal signal is present in the cervical and upper thoracic spinal cord to the lowest imaged level, T4-5. Marrow signal is slightly heterogeneous without focal lesions. Vertebral body heights and alignment are maintained. The craniocervical junction is within normal limits.  Study is moderately degraded by patient motion.  C2-3: Mild foraminal narrowing bilaterally is due to uncovertebral disease.  C3-4: A broad-based disc osteophyte complex partially effaces the ventral CSF. Mild foraminal  narrowing bilaterally is worse on the right.  C4-5: A broad-based disc osteophyte complex partially effaces the ventral CSF. Uncovertebral and facet disease is noted bilaterally. There is mild foraminal narrowing bilaterally.  C5-6: A broad-based disc osteophyte complex is present. Mild foraminal narrowing is present bilaterally due to uncovertebral disease.  C6-7: A mild disc osteophyte complex is present without significant stenosis.  C7-T1:  Negative.  IMPRESSION: 1. The study is moderately degraded by patient motion. 2. Mild atrophy and white matter disease is similar to the prior study. 3. No acute or focal lesion to explain the patient's symptoms. 4. Multilevel spondylosis of cervical spine with mild foraminal narrowing bilaterally at C3-4, C4-5, and at C5-6 due to uncovertebral spurring bilaterally. 5. No focal cord signal abnormality or significant central canal stenosis.   Electronically Signed   By: Lawrence Santiago M.D.   On: 08/29/2014 20:45      FOLLOW UP PLANS AND APPOINTMENTS Discharge Instructions    Discharge patient    Complete by:  As directed             Medication List    STOP taking these medications        diltiazem 120 MG 24 hr capsule  Commonly known as:  DILACOR XR     predniSONE 20 MG tablet  Commonly known as:  DELTASONE      TAKE these medications        albuterol 108 (90 BASE) MCG/ACT inhaler  Commonly known as:  PROVENTIL HFA;VENTOLIN HFA  Inhale 2 puffs into the lungs every 4 (four) hours as needed for wheezing or shortness of breath.     aspirin EC 81 MG tablet  Take 81 mg by mouth 2 (two) times daily.     digoxin 0.125 MG tablet  Commonly known as:  LANOXIN  Take 0.125 mg by mouth daily.     eplerenone 50 MG tablet  Commonly known as:  INSPRA  Take 50 mg by mouth daily.     fenofibrate 145 MG tablet  Commonly known as:  TRICOR  Take 145 mg by mouth daily after breakfast.     fluticasone 110 MCG/ACT inhaler  Commonly known as:  FLOVENT HFA   Inhale 1 puff into the lungs 2 (two) times daily.     fluticasone 50 MCG/ACT nasal spray  Commonly known as:  FLONASE  Place 1 spray into both nostrils daily as needed for allergies.     GENTEAL OP  Place 1 drop into both eyes 2 (two) times daily as needed (dry eye).     levofloxacin 500 MG tablet  Commonly known as:  LEVAQUIN  Take 1 tablet (500 mg total) by mouth  daily. For 6 more days     lisinopril 2.5 MG tablet  Commonly known as:  PRINIVIL,ZESTRIL  Take 2.5 mg by mouth daily after breakfast.     metFORMIN 1000 MG tablet  Commonly known as:  GLUCOPHAGE  Take 1,000 mg by mouth 2 (two) times daily with a meal.     metoprolol 50 MG tablet  Commonly known as:  LOPRESSOR  Take 50 mg by mouth 2 (two) times daily.     nitroGLYCERIN 0.4 MG SL tablet  Commonly known as:  NITROSTAT  Place 0.4 mg under the tongue every 5 (five) minutes x 3 doses as needed for chest pain. For chest pain     pravastatin 40 MG tablet  Commonly known as:  PRAVACHOL  Take 40 mg by mouth at bedtime.     torsemide 20 MG tablet  Commonly known as:  DEMADEX  Take 1 tablet (20 mg total) by mouth daily.     traMADol 50 MG tablet  Commonly known as:  ULTRAM  Take 50 mg by mouth every 8 (eight) hours as needed for severe pain.     triamcinolone cream 0.1 %  Commonly known as:  KENALOG  Apply 1 application topically daily as needed. APPLY TO FEET           Follow-up Information    Follow up with Laverda Page, MD. Go on 09/26/2014.   Specialty:  Cardiology   Why:  at 11:00am   Contact information:   [address] [place] [address] [phone]        Rachel Bo, NP-C 09/19/2014, 8:27 AM  Pager: [phone] Office: [phone]  I have personally reviewed the patient's record and performed physical exam and agree with the assessment and plan of Ms. Neldon Labella, NP-C.  Laverda Page, MD [facility] Pager: [phone].    Office: [phone] If no answer: Mobile: [phone]

## 2014-09-19 NOTE — Progress Notes (Signed)
D/c instructions reviewed with pt and his daughter, copy of instructions given to pt. Discussed daily weights, documenting and when to call MD, handouts given also on CHF and diet. Pt d/c'd via wheelchair with belongings, escorted by hospital volunteer, and with daughter.

## 2014-09-19 NOTE — Care Management Note (Signed)
    Page 1 of 1   09/19/2014     11:06:15 AM CARE MANAGEMENT NOTE 09/19/2014  Patient:  [name], [name]   Account Number:  [id number]  Date Initiated:  09/19/2014  Documentation initiated by:  Marvetta Gibbons  Subjective/Objective Assessment:   Pt admitted with CHF     Action/Plan:   PTA pt lived at home- independent- plan to return home- no anticipated needs for [facility] f/u   Anticipated DC Date:  09/19/2014   Anticipated DC Plan:  HOME/SELF CARE         Choice offered to / List presented to:             Status of service:  Completed, signed off Medicare Important Message given?  YES (If response is "NO", the following Medicare IM given date fields will be blank) Date Medicare IM given:  09/19/2014 Medicare IM given by:  Marvetta Gibbons Date Additional Medicare IM given:   Additional Medicare IM given by:    Discharge Disposition:  HOME/SELF CARE  Per UR Regulation:  Reviewed for med. necessity/level of care/duration of stay  If discussed at [place] of Stay Meetings, dates discussed:    Comments:

## 2014-10-01 ENCOUNTER — Ambulatory Visit (INDEPENDENT_AMBULATORY_CARE_PROVIDER_SITE_OTHER): Payer: Medicare Other | Admitting: Diagnostic Neuroimaging

## 2014-10-01 ENCOUNTER — Encounter: Payer: Self-pay | Admitting: Diagnostic Neuroimaging

## 2014-10-01 VITALS — BP 106/62 | HR 52 | Temp 97.0°F | Ht 72.0 in | Wt 203.4 lb

## 2014-10-01 DIAGNOSIS — R251 Tremor, unspecified: Secondary | ICD-10-CM

## 2014-10-01 NOTE — Patient Instructions (Signed)
Follow up as needed

## 2014-10-01 NOTE — Progress Notes (Signed)
[facility]  PATIENT: [name] DOB: 1941-06-27  REFERRING CLINICIAN: Ganji HISTORY FROM: patient and daughters REASON FOR VISIT: follow up   HISTORICAL  CHIEF COMPLAINT:  Chief Complaint  Patient presents with  . Follow-up    1 month follow up on myoclonus and tremors     HISTORY OF PRESENT ILLNESS:   UPDATE 10/01/14: Since last visit, doing well. After last visit, was admitted to hospital, worked up with MRI, lab work, and then tremors spontaneously resolved within 24 hours. Of note he was noted to have wheezing, SOB and acute bronchitis vs COPD exacerbation, which also improved with steroids and nebulizer and abx. Since then, no recurrence of myoclonus or tremors.  PRIOR HPI (08/29/14):  74 year old male with hypertension, coronary artery disease status post CABG, atrial flutter with rapid ventricular response in 2013, chronic atrial fibrillation not on anticoagulation due to history of GI diverticular bleeding, congestive heart failure, left renal mass status post resection, here for evaluation of new onset tremor shaking movements since yesterday. Patient is accompanied by his family for this visit. I was asked to see this patient urgently in neurology clinic from Dr. Einar Gip, who saw the patient yesterday for cardiac issues. Of note 2 weeks ago patient had bronchitis with reported recovery. However patient continued to have some shortness of breath as well as reported 20 pound weight gain over past few weeks. He followed up with cardiology and PCP, and felt to have acute on chronic diastolic heart failure. Patient's medications were adjusted to improve his diuresis. Yesterday during the day he started to have intermittent involuntary shaking movements of his head, shoulders, neck, hands. Symptoms worse when he is laying down. He is not able to suppress these movements. If he is standing when these movements occur, he tends to lose his balance. Symptoms gradually  increased in severity last night. Today episodes are quite frequent. Episodes last for 2 seconds at a time, multiple times a minute. He does not lose consciousness. He was unable to sleep last night due to severity of these involuntary movements. Of note I previously saw patient in 2011 for a different issue, consisting of syncopal event and recurrent dizzy episodes. 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   REVIEW OF SYSTEMS: Full 14 system review of systems performed and notable only for leg swelling joint pain restless legs.   ALLERGIES: No Known Allergies  HOME MEDICATIONS: Outpatient Prescriptions Prior to Visit  Medication Sig Dispense Refill  . aspirin EC 81 MG tablet Take 81 mg by mouth 2 (two) times daily.    . Carboxymethylcell-Hypromellose (GENTEAL OP) Place 1 drop into both eyes 2 (two) times daily as needed (dry eye).    Marland Kitchen digoxin (LANOXIN) 0.125 MG tablet Take 0.125 mg by mouth daily.    Marland Kitchen eplerenone (INSPRA) 50 MG tablet Take 50 mg by mouth daily.    . fenofibrate (TRICOR) 145 MG tablet Take 145 mg by mouth daily after breakfast.     . fluticasone (FLOVENT HFA) 110 MCG/ACT inhaler Inhale 1 puff into the lungs 2 (two) times daily. 1 Inhaler 3  . lisinopril (PRINIVIL,ZESTRIL) 2.5 MG tablet Take 2.5 mg by mouth daily after breakfast.     . metFORMIN (GLUCOPHAGE) 1000 MG tablet Take 1,000 mg by mouth 2 (two) times daily with a meal.    . metoprolol (LOPRESSOR) 50 MG tablet Take 50 mg by mouth 2 (two) times daily.     Marland Kitchen  nitroGLYCERIN (NITROSTAT) 0.4 MG SL tablet Place 0.4 mg under the tongue every 5 (five) minutes x 3 doses as needed for chest pain. For chest pain    . pravastatin (PRAVACHOL) 40 MG tablet Take 40 mg by mouth at bedtime.     . torsemide (DEMADEX) 20 MG tablet Take 1 tablet (20 mg total) by mouth daily. 30 tablet 0  . traMADol (ULTRAM) 50 MG tablet Take 50 mg by mouth every 8 (eight) hours as needed for  severe pain.     Marland Kitchen triamcinolone cream (KENALOG) 0.1 % Apply 1 application topically daily as needed. APPLY TO FEET    . albuterol (PROVENTIL HFA;VENTOLIN HFA) 108 (90 BASE) MCG/ACT inhaler Inhale 2 puffs into the lungs every 4 (four) hours as needed for wheezing or shortness of breath.    . fluticasone (FLONASE) 50 MCG/ACT nasal spray Place 1 spray into both nostrils daily as needed for allergies.   2  . levofloxacin (LEVAQUIN) 500 MG tablet Take 1 tablet (500 mg total) by mouth daily. For 6 more days (Patient not taking: Reported on 09/16/2014) 6 tablet 0   No facility-administered medications prior to visit.    PAST MEDICAL HISTORY: Past Medical History  Diagnosis Date  . CAD (coronary artery disease)   . Acute non-ST segment elevation myocardial infarction   . Left renal mass   . Diverticulitis   . HTN (hypertension)   . Myocardial infarction 06/19/2011  . Ulcer 1960's    "stomach"  . Renal neoplasm     LEFT  . Blood transfusion     "related to surgeries"  . Diverticulitis 1996  . CHF (congestive heart failure)   . High cholesterol   . Type II diabetes mellitus   . Arthritis     "hands" (09/16/2014)  . COPD (chronic obstructive pulmonary disease)     PAST SURGICAL HISTORY: Past Surgical History  Procedure Laterality Date  . Coronary artery bypass graft  06/2011    4 VESSELS  . Colon resection  2005    FOR DIVERTICULITIS  . Robot assisted laparoscopic nephrectomy  11/29/2011    Procedure: ROBOTIC ASSISTED LAPAROSCOPIC NEPHRECTOMY;  Surgeon: Dutch Gray, MD;  Location: [facility];  Service: Urology;  Laterality: Left;  PARTIAL left nephrectomy    . Cardioversion  01/11/2012    Procedure: CARDIOVERSION;  Surgeon: Laverda Page, MD;  Location: [facility];  Service: Cardiovascular;  Laterality: N/A;  . Colonoscopy  02/02/2012    Procedure: COLONOSCOPY;  Surgeon: Jeryl Columbia, MD;  Location: [facility] ENDOSCOPY;  Service: Endoscopy;  Laterality: N/A;  magod /ja  . Cholecystectomy    .  Hernia repair Bilateral X 4    "twice on each side"  . Colon surgery    . Cataract extraction w/ intraocular lens  implant, bilateral Bilateral     FAMILY HISTORY: Family History  Problem Relation Age of Onset  . Heart disease Father   . Alzheimer's disease Mother   . Alcohol abuse Brother     SOCIAL HISTORY:  History   Social History  . Marital Status: Married    Spouse Name: N/A    Number of Children: 3  . Years of Education: N/A   Occupational History  .     Social History Main Topics  . Smoking status: Former Smoker -- 0.50 packs/day for 52 years    Types: Cigarettes    Quit date: 06/19/2011  . Smokeless tobacco: Never Used  . Alcohol Use: No  . Drug Use:  No  . Sexual Activity: Not Currently   Other Topics Concern  . Not on file   Social History Narrative     PHYSICAL EXAM  Filed Vitals:   10/01/14 1352  BP: 106/62  Pulse: 52  Temp: 97 F (36.1 C)  TempSrc: Oral  Height: 6' (1.829 m)  Weight: 203 lb 6.4 oz (92.262 kg)    Body mass index is 27.58 kg/(m^2).   Visual Acuity Screening   Right eye Left eye Both eyes  Without correction: 20/70 20/40   With correction:       No flowsheet data found.  GENERAL EXAM: NO DISTRESS; SOFT SPOKEN. STOOPED POSTURE. Neck is supple  CARDIOVASCULAR: DISTANT HEART SOUNDS Regular rate and rhythm, no murmurs, no carotid bruits  NEUROLOGIC: MENTAL STATUS: awake, alert, language fluent, comprehension intact, naming intact, fund of knowledge appropriate CRANIAL NERVE: pupils equal and reactive to light, visual fields full to confrontation, extraocular muscles intact, no nystagmus, facial sensation and strength symmetric, hearing intact, palate elevates symmetrically, uvula midline, shoulder shrug symmetric, tongue midline. MOTOR: MILD POSTURAL TREMOR IN BUE, normal bulk; BUE 5, BLE 5 SENSORY: normal and symmetric to light touch, temperature, vibration COORDINATION: finger-nose-finger, fine finger movements,  normal REFLEXES: deep tendon reflexes present and symmetric GAIT/STATION: STOOPED POSTURE   DIAGNOSTIC DATA (LABS, IMAGING, TESTING) - I reviewed patient records, labs, notes, testing and imaging myself where available.  Lab Results  Component Value Date   WBC 7.2 09/16/2014   HGB 16.4 09/16/2014   HCT 47.1 09/16/2014   MCV 89.5 09/16/2014   PLT PLATELET CLUMPS NOTED ON SMEAR, UNABLE TO ESTIMATE 09/16/2014      Component Value Date/Time   NA 136 09/19/2014 0438   K 3.9 09/19/2014 0438   CL 102 09/19/2014 0438   CO2 24 09/19/2014 0438   GLUCOSE 94 09/19/2014 0438   BUN 22 09/19/2014 0438   CREATININE 1.46* 09/19/2014 0438   CALCIUM 8.8 09/19/2014 0438   PROT 6.3 09/16/2014 1250   ALBUMIN 3.4* 09/16/2014 1250   AST 19 09/16/2014 1250   ALT 19 09/16/2014 1250   ALKPHOS 29* 09/16/2014 1250   BILITOT 0.9 09/16/2014 1250   GFRNONAA 45* 09/19/2014 0438   GFRAA 52* 09/19/2014 0438   Lab Results  Component Value Date   CHOL 132 06/20/2011   HDL 27* 06/20/2011   LDLCALC 76 06/20/2011   TRIG 147 06/20/2011   CHOLHDL 4.9 06/20/2011   Lab Results  Component Value Date   HGBA1C 6.9* 08/29/2014   Lab Results  Component Value Date   VITAMINB12 379 08/29/2014   Lab Results  Component Value Date   TSH 1.466 09/16/2014    I reviewed images myself and agree with interpretation. -VRP  02/18/10 MRI brain - Age related atrophy and mild chronic small vessel disease. No acute or reversible pathology.  02/18/10 MRA head - No evidence of intracranial aneurysm. 80% or greater stenosis of the distal left vertebral artery just proximal to the basilar. Some atherosclerotic change in the more distal intracranial branch vessels, most notable in the PCA territories, right more than left.  02/18/10 MRA neck - Mild atherosclerotic disease of both carotid bifurcations but without measurable stenosis. Mild stenotic disease of both vertebral artery origins.  08/29/14 MRI brain and cervical  spine 1. The study is moderately degraded by patient motion. 2. Mild atrophy and white matter disease is similar to the prior study. 3. No acute or focal lesion to explain the patient's symptoms. 4. Multilevel spondylosis of cervical  spine with mild foraminal narrowing bilaterally at C3-4, C4-5, and at C5-6 due to uncovertebral spurring bilaterally. 5. No focal cord signal abnormality or significant central canal stenosis.  08/29/14 Labs: TSH, folate, B12, ammonia --> normal; pBNP 1356 (H)    ASSESSMENT AND PLAN  74 y.o. year old male here with new onset tremor, asterixis and multifocal myoclonus on 08/28/14, with worsening CHF recently and recent bout of bronchitis. Suspect tremor/myoclonus was related to metabolic etiology (i.e. acute bronchitis/COPD and hypoxia). Tremor/myoclonus has resolved.   PLAN: - follow up as needed  Return if symptoms worsen or fail to improve, for return to PCP.    Penni Bombard, MD 12/15/5246, 1:85 PM Certified in Neurology, Neurophysiology and Neuroimaging  [facility] [address], [place], [place] [address] [phone]

## 2015-01-22 ENCOUNTER — Other Ambulatory Visit (HOSPITAL_COMMUNITY): Payer: Self-pay | Admitting: Urology

## 2015-01-22 ENCOUNTER — Ambulatory Visit (HOSPITAL_COMMUNITY)
Admission: RE | Admit: 2015-01-22 | Discharge: 2015-01-22 | Disposition: A | Discharge status: Home / Self Care | Payer: Medicare Other | Source: Ambulatory Visit | Attending: Urology | Admitting: Urology

## 2015-01-22 DIAGNOSIS — C649 Malignant neoplasm of unspecified kidney, except renal pelvis: Secondary | ICD-10-CM | POA: Diagnosis not present

## 2015-03-12 ENCOUNTER — Encounter (HOSPITAL_COMMUNITY): Payer: Self-pay

## 2015-03-12 ENCOUNTER — Emergency Department (HOSPITAL_COMMUNITY): Payer: Medicare Other

## 2015-03-12 ENCOUNTER — Observation Stay (HOSPITAL_COMMUNITY)
Admission: EM | Admit: 2015-03-12 | Discharge: 2015-03-13 | Disposition: A | Discharge status: Home / Self Care | Payer: Medicare Other | Attending: Cardiology | Admitting: Cardiology

## 2015-03-12 DIAGNOSIS — M19041 Primary osteoarthritis, right hand: Secondary | ICD-10-CM | POA: Diagnosis not present

## 2015-03-12 DIAGNOSIS — J449 Chronic obstructive pulmonary disease, unspecified: Secondary | ICD-10-CM | POA: Diagnosis not present

## 2015-03-12 DIAGNOSIS — R079 Chest pain, unspecified: Principal | ICD-10-CM | POA: Diagnosis present

## 2015-03-12 DIAGNOSIS — I2510 Atherosclerotic heart disease of native coronary artery without angina pectoris: Secondary | ICD-10-CM | POA: Insufficient documentation

## 2015-03-12 DIAGNOSIS — E785 Hyperlipidemia, unspecified: Secondary | ICD-10-CM | POA: Insufficient documentation

## 2015-03-12 DIAGNOSIS — Z951 Presence of aortocoronary bypass graft: Secondary | ICD-10-CM | POA: Diagnosis not present

## 2015-03-12 DIAGNOSIS — Z87891 Personal history of nicotine dependence: Secondary | ICD-10-CM | POA: Diagnosis not present

## 2015-03-12 DIAGNOSIS — I4891 Unspecified atrial fibrillation: Secondary | ICD-10-CM | POA: Diagnosis present

## 2015-03-12 DIAGNOSIS — E119 Type 2 diabetes mellitus without complications: Secondary | ICD-10-CM | POA: Insufficient documentation

## 2015-03-12 DIAGNOSIS — N183 Chronic kidney disease, stage 3 (moderate): Secondary | ICD-10-CM | POA: Diagnosis not present

## 2015-03-12 DIAGNOSIS — Z79899 Other long term (current) drug therapy: Secondary | ICD-10-CM | POA: Insufficient documentation

## 2015-03-12 DIAGNOSIS — I129 Hypertensive chronic kidney disease with stage 1 through stage 4 chronic kidney disease, or unspecified chronic kidney disease: Secondary | ICD-10-CM | POA: Insufficient documentation

## 2015-03-12 DIAGNOSIS — Z7982 Long term (current) use of aspirin: Secondary | ICD-10-CM | POA: Insufficient documentation

## 2015-03-12 DIAGNOSIS — I252 Old myocardial infarction: Secondary | ICD-10-CM | POA: Insufficient documentation

## 2015-03-12 DIAGNOSIS — I5032 Chronic diastolic (congestive) heart failure: Secondary | ICD-10-CM | POA: Insufficient documentation

## 2015-03-12 DIAGNOSIS — M19042 Primary osteoarthritis, left hand: Secondary | ICD-10-CM | POA: Insufficient documentation

## 2015-03-12 MED ORDER — ASPIRIN 81 MG PO CHEW
324.0000 mg | CHEWABLE_TABLET | Freq: Once | ORAL | Status: AC
  Administered 2015-03-13: 324 mg via ORAL
  Filled 2015-03-12: qty 4

## 2015-03-12 MED ORDER — METOPROLOL TARTRATE 1 MG/ML IV SOLN
5.0000 mg | Freq: Once | INTRAVENOUS | Status: AC
  Administered 2015-03-13: 5 mg via INTRAVENOUS
  Filled 2015-03-12: qty 5

## 2015-03-12 NOTE — ED Provider Notes (Signed)
CSN: [id number]     Arrival date & time 03/12/15  2315 History  This chart was scribed for Alan Rice, MD by Sima Matas, ED Scribe. This patient was seen in room A03C/A03C and the patient's care was started at 11:27 PM.    Chief Complaint  Patient presents with  . Chest Pain  . Atrial Fibrillation  . Shortness of Breath    The history is provided by the patient and the EMS personnel. No language interpreter was used.   HPI Comments: [name] is a 74 y.o. male with a PMHx of MI, CAD, HTN, CHF, who presents to the Emergency Department complaining of constant, moderate, central chest pain onset 2.5 hours ago while at rest. Patient complains of associated SOB, irregular palpitations and nausea. He states that the pain and SOB lasted approximately 30 minutes. He took 2 NTG at home with relief. He states that it felt similar to past MI. Patient went to the fire department for persistent palpitations and was brought in by EMS. Per EMS, his HR was 130-150 and was put on O2 bringing his HR down to 120. Patient has a history of CABG in 2012.  Patient is not on a blood thinner but takes aspirin 2x a day.  He denies taking any aspirin today. He states that he stopped taking other medications 3 days ago. His cardiologist is Dr. Nadyne Coombes.  Patient also complains of associated right sided abdominal pain for the past 3 days. This is constant pain. Patient still has his gallbladder and believes this might be the source of the pain.  He states that eating doesn't make it better or worse. He denies associated vomiting, leg swelling and leg pain.     Past Medical History  Diagnosis Date  . CAD (coronary artery disease)   . Acute non-ST segment elevation myocardial infarction   . Left renal mass   . Diverticulitis   . HTN (hypertension)   . Myocardial infarction 06/19/2011  . Ulcer 1960's    "stomach"  . Renal neoplasm     LEFT  . Blood transfusion     "related to surgeries"  . Diverticulitis 1996   . CHF (congestive heart failure)   . High cholesterol   . Type II diabetes mellitus   . Arthritis     "hands" (09/16/2014)  . COPD (chronic obstructive pulmonary disease)    Past Surgical History  Procedure Laterality Date  . Coronary artery bypass graft  06/2011    4 VESSELS  . Colon resection  2005    FOR DIVERTICULITIS  . Robot assisted laparoscopic nephrectomy  11/29/2011    Procedure: ROBOTIC ASSISTED LAPAROSCOPIC NEPHRECTOMY;  Surgeon: Dutch Gray, MD;  Location: [facility];  Service: Urology;  Laterality: Left;  PARTIAL left nephrectomy    . Cardioversion  01/11/2012    Procedure: CARDIOVERSION;  Surgeon: Laverda Page, MD;  Location: ;  Service: Cardiovascular;  Laterality: N/A;  . Colonoscopy  02/02/2012    Procedure: COLONOSCOPY;  Surgeon: Jeryl Columbia, MD;  Location: [facility] ENDOSCOPY;  Service: Endoscopy;  Laterality: N/A;  magod /ja  . Cholecystectomy    . Hernia repair Bilateral X 4    "twice on each side"  . Colon surgery    . Cataract extraction w/ intraocular lens  implant, bilateral Bilateral    Family History  Problem Relation Age of Onset  . Heart disease Father   . Alzheimer's disease Mother   . Alcohol abuse Brother  History  Substance Use Topics  . Smoking status: Former Smoker -- 0.50 packs/day for 52 years    Types: Cigarettes    Quit date: 06/19/2011  . Smokeless tobacco: Never Used  . Alcohol Use: No    Review of Systems  Constitutional: Negative for fever and chills.  Respiratory: Positive for chest tightness and shortness of breath.   Cardiovascular: Positive for chest pain and palpitations. Negative for leg swelling.  Gastrointestinal: Positive for nausea and abdominal pain. Negative for vomiting and constipation.  Genitourinary: Negative for dysuria, frequency and flank pain.  Musculoskeletal: Negative for myalgias, back pain, neck pain and neck stiffness.  Skin: Negative for rash and wound.  Neurological: Negative for dizziness,  weakness, light-headedness, numbness and headaches.  All other systems reviewed and are negative.   Allergies  Review of patient's allergies indicates no known allergies.  Home Medications   Prior to Admission medications   Medication Sig Start Date End Date Taking? Authorizing Provider  albuterol (PROVENTIL HFA;VENTOLIN HFA) 108 (90 BASE) MCG/ACT inhaler Inhale 1-2 puffs into the lungs every 4 (four) hours as needed for wheezing or shortness of breath.   Yes Historical Provider, MD  azelastine (ASTELIN) 0.1 % nasal spray Place 1 spray into both nostrils daily as needed for allergies.  12/24/14  Yes Historical Provider, MD  fluticasone (FLOVENT HFA) 110 MCG/ACT inhaler Inhale 1 puff into the lungs 2 (two) times daily. 08/30/14  Yes Bonnielee Haff, MD  ketoconazole (NIZORAL) 2 % cream Apply 1 application topically daily. Apply to rash on feet   Yes Historical Provider, MD  nitroGLYCERIN (NITROSTAT) 0.4 MG SL tablet Place 0.4 mg under the tongue every 5 (five) minutes x 3 doses as needed for chest pain. For chest pain   Yes Historical Provider, MD  spironolactone (ALDACTONE) 50 MG tablet Take 50 mg by mouth daily.   Yes Historical Provider, MD  aspirin EC 81 MG tablet Take 81 mg by mouth 2 (two) times daily.    Historical Provider, MD  Carboxymethylcell-Hypromellose (GENTEAL OP) Place 1 drop into both eyes 2 (two) times daily as needed (dry eye).    Historical Provider, MD  digoxin (LANOXIN) 0.125 MG tablet Take 0.125 mg by mouth daily.    Historical Provider, MD  fenofibrate (TRICOR) 145 MG tablet Take 145 mg by mouth daily after breakfast.     Historical Provider, MD  lisinopril (PRINIVIL,ZESTRIL) 2.5 MG tablet Take 2.5 mg by mouth daily after breakfast.     Historical Provider, MD  metFORMIN (GLUCOPHAGE) 1000 MG tablet Take 1,000 mg by mouth 2 (two) times daily with a meal.    Historical Provider, MD  metoprolol (LOPRESSOR) 50 MG tablet Take 50 mg by mouth 2 (two) times daily.  12/27/13    Historical Provider, MD  montelukast (SINGULAIR) 10 MG tablet Take 1 tablet by mouth daily. 02/27/15   Historical Provider, MD  pravastatin (PRAVACHOL) 40 MG tablet Take 40 mg by mouth at bedtime.  11/27/13   Historical Provider, MD  traMADol (ULTRAM) 50 MG tablet Take 50 mg by mouth every 8 (eight) hours as needed for severe pain.  01/02/14   Historical Provider, MD   Triage Vitals: BP 125/62 mmHg  Pulse 64  Temp(Src) 97.5 F (36.4 C) (Oral)  Resp 29  SpO2 95% Physical Exam  Constitutional: He is oriented to person, place, and time. He appears well-developed and well-nourished. No distress.  HENT:  Head: Normocephalic and atraumatic.  Mouth/Throat: Oropharynx is clear and moist.  Eyes: EOM are  normal. Pupils are equal, round, and reactive to light.  Neck: Normal range of motion. Neck supple.  Cardiovascular:  Irregularly irregular  Pulmonary/Chest: Effort normal and breath sounds normal. No respiratory distress. He has no wheezes. He has no rales.  Abdominal: Soft. Bowel sounds are normal. He exhibits no distension and no mass. There is tenderness (tenderness to palpation in the right upper quadrant. There is no rebound or guarding.). There is no rebound and no guarding.  Musculoskeletal: Normal range of motion. He exhibits no edema or tenderness.  No CVA tenderness bilaterally. No calf swelling or tenderness.  Neurological: He is alert and oriented to person, place, and time.  Moves all extremities without deficit. Sensation is grossly intact.  Skin: Skin is warm and dry. No rash noted. No erythema.  Psychiatric: He has a normal mood and affect. His behavior is normal.  Nursing note and vitals reviewed.   ED Course  Procedures  DIAGNOSTIC STUDIES: Oxygen Saturation is 95% on room air, adequate by my interpretation.    COORDINATION OF CARE: 11:35 PM- Pt advised of plan for treatment and pt agrees.  Labs Review Labs Reviewed  CBC WITH DIFFERENTIAL/PLATELET - Abnormal; Notable  for the following:    RBC 5.88 (*)    Hemoglobin 18.1 (*)    All other components within normal limits  COMPREHENSIVE METABOLIC PANEL - Abnormal; Notable for the following:    Sodium 133 (*)    CO2 19 (*)    Glucose, Bld 183 (*)    Calcium 8.8 (*)    GFR calc non Af Amer 57 (*)    All other components within normal limits  BRAIN NATRIURETIC PEPTIDE - Abnormal; Notable for the following:    B Natriuretic Peptide 239.1 (*)    All other components within normal limits  TROPONIN I  PROTIME-INR  APTT  URINALYSIS, ROUTINE W REFLEX MICROSCOPIC (NOT AT [facility])    Imaging Review Dg Chest Port 1 View  03/13/2015   CLINICAL DATA:  Chest pain and dyspnea.  EXAM: PORTABLE CHEST - 1 VIEW  COMPARISON:  01/22/2015  FINDINGS: A single AP portable view of the chest demonstrates no focal airspace consolidation or alveolar edema. The lungs are grossly clear. There is no large effusion or pneumothorax. Cardiac and mediastinal contours appear unremarkable.  IMPRESSION: No active disease.   Electronically Signed   By: Andreas Newport M.D.   On: 03/13/2015 00:00     EKG Interpretation None      MDM   Final diagnoses:  Chest pain  Atrial fibrillation with RVR    I personally performed the services described in this documentation, which was scribed in my presence. The recorded information has been reviewed and is accurate.  Patient heart rate is improved after metoprolol. Discussed with cardiology and will admit for observation.     Alan Rice, MD 03/13/15 [phone]

## 2015-03-12 NOTE — ED Notes (Signed)
Dr Yelverton at bedside.  

## 2015-03-12 NOTE — ED Notes (Signed)
Per [facility]: Pt went to the fire department because of chest pain and shortness of breath. The pt has been out of his A fib meds for 3 days. The pt took 2 of his nitro prior to going to the fire department and had some relief of chest pain, but not shortness of breath. When the pt got to the fire department the pts heart rate was between 170 and 180. THe pt becomes extremely short of breath even with speaking.

## 2015-03-13 DIAGNOSIS — R079 Chest pain, unspecified: Secondary | ICD-10-CM | POA: Diagnosis present

## 2015-03-13 LAB — CBC WITH DIFFERENTIAL/PLATELET
BASOS PCT: 0 % (ref 0–1)
Basophils Absolute: 0 10*3/uL (ref 0.0–0.1)
EOS ABS: 0 10*3/uL (ref 0.0–0.7)
EOS PCT: 0 % (ref 0–5)
HCT: 51.4 % (ref 39.0–52.0)
HEMOGLOBIN: 18.1 g/dL — AB (ref 13.0–17.0)
Lymphocytes Relative: 18 % (ref 12–46)
Lymphs Abs: 1.5 10*3/uL (ref 0.7–4.0)
MCH: 30.8 pg (ref 26.0–34.0)
MCHC: 35.2 g/dL (ref 30.0–36.0)
MCV: 87.4 fL (ref 78.0–100.0)
MONO ABS: 0.7 10*3/uL (ref 0.1–1.0)
MONOS PCT: 8 % (ref 3–12)
Neutro Abs: 6.5 10*3/uL (ref 1.7–7.7)
Neutrophils Relative %: 74 % (ref 43–77)
Platelets: 159 10*3/uL (ref 150–400)
RBC: 5.88 MIL/uL — ABNORMAL HIGH (ref 4.22–5.81)
RDW: 14.8 % (ref 11.5–15.5)
WBC: 8.7 10*3/uL (ref 4.0–10.5)

## 2015-03-13 LAB — COMPREHENSIVE METABOLIC PANEL
ALT: 18 U/L (ref 17–63)
AST: 23 U/L (ref 15–41)
Albumin: 3.7 g/dL (ref 3.5–5.0)
Alkaline Phosphatase: 39 U/L (ref 38–126)
Anion gap: 11 (ref 5–15)
BUN: 17 mg/dL (ref 6–20)
CALCIUM: 8.8 mg/dL — AB (ref 8.9–10.3)
CO2: 19 mmol/L — ABNORMAL LOW (ref 22–32)
Chloride: 103 mmol/L (ref 101–111)
Creatinine, Ser: 1.2 mg/dL (ref 0.61–1.24)
GFR calc Af Amer: >60 mL/min (ref >60)
GFR calc non Af Amer: 57 mL/min — ABNORMAL LOW (ref >60)
Glucose, Bld: 183 mg/dL — ABNORMAL HIGH (ref 65–99)
Potassium: 4 mmol/L (ref 3.5–5.1)
SODIUM: 133 mmol/L — AB (ref 135–145)
TOTAL PROTEIN: 7.4 g/dL (ref 6.5–8.1)
Total Bilirubin: 0.5 mg/dL (ref 0.3–1.2)

## 2015-03-13 LAB — URINE MICROSCOPIC-ADD ON

## 2015-03-13 LAB — URINALYSIS, ROUTINE W REFLEX MICROSCOPIC
GLUCOSE, UA: NEGATIVE mg/dL
HGB URINE DIPSTICK: NEGATIVE
Ketones, ur: NEGATIVE mg/dL
LEUKOCYTES UA: NEGATIVE
Nitrite: NEGATIVE
PH: 5.5 (ref 5.0–8.0)
Protein, ur: 100 mg/dL — AB
Specific Gravity, Urine: 1.026 (ref 1.005–1.030)
UROBILINOGEN UA: 0.2 mg/dL (ref 0.0–1.0)

## 2015-03-13 LAB — TROPONIN I: Troponin I: 0.03 ng/mL (ref <0.031)

## 2015-03-13 LAB — PROTIME-INR
INR: 1.16 (ref 0.00–1.49)
PROTHROMBIN TIME: 15 s (ref 11.6–15.2)

## 2015-03-13 LAB — APTT: aPTT: 30 seconds (ref 24–37)

## 2015-03-13 LAB — BRAIN NATRIURETIC PEPTIDE: B Natriuretic Peptide: 239.1 pg/mL — ABNORMAL HIGH (ref 0.0–100.0)

## 2015-03-13 MED ORDER — BUDESONIDE 0.25 MG/2ML IN SUSP
0.2500 mg | Freq: Two times a day (BID) | RESPIRATORY_TRACT | Status: DC
  Filled 2015-03-13 (×3): qty 2

## 2015-03-13 MED ORDER — TORSEMIDE 20 MG PO TABS
20.0000 mg | ORAL_TABLET | Freq: Every day | ORAL | Status: DC
  Filled 2015-03-13: qty 1

## 2015-03-13 MED ORDER — SPIRONOLACTONE 25 MG PO TABS
25.0000 mg | ORAL_TABLET | Freq: Every day | ORAL | Status: DC
  Filled 2015-03-13: qty 1

## 2015-03-13 MED ORDER — METFORMIN HCL 500 MG PO TABS
1000.0000 mg | ORAL_TABLET | Freq: Two times a day (BID) | ORAL | Status: DC
Start: 2015-03-13 — End: 2015-03-13
  Administered 2015-03-13: 1000 mg via ORAL
  Filled 2015-03-13 (×3): qty 2

## 2015-03-13 MED ORDER — FLUTICASONE PROPIONATE HFA 110 MCG/ACT IN AERO
1.0000 | INHALATION_SPRAY | Freq: Two times a day (BID) | RESPIRATORY_TRACT | Status: DC
Start: 2015-03-13 — End: 2015-03-13

## 2015-03-13 MED ORDER — METOPROLOL TARTRATE 50 MG PO TABS
50.0000 mg | ORAL_TABLET | Freq: Two times a day (BID) | ORAL | Status: DC
  Administered 2015-03-13: 50 mg via ORAL
  Filled 2015-03-13 (×3): qty 1

## 2015-03-13 MED ORDER — ENSURE ENLIVE PO LIQD: 237.0000 mL | Freq: Two times a day (BID) | ORAL | Status: DC

## 2015-03-13 MED ORDER — PRAVASTATIN SODIUM 40 MG PO TABS
40.0000 mg | ORAL_TABLET | Freq: Every day | ORAL | Status: DC
Start: 2015-03-13 — End: 2015-03-13
  Filled 2015-03-13: qty 1

## 2015-03-13 MED ORDER — LISINOPRIL 2.5 MG PO TABS
2.5000 mg | ORAL_TABLET | Freq: Every day | ORAL | Status: DC
  Administered 2015-03-13: 2.5 mg via ORAL
  Filled 2015-03-13 (×2): qty 1

## 2015-03-13 MED ORDER — DIGOXIN 125 MCG PO TABS
0.1250 mg | ORAL_TABLET | Freq: Every day | ORAL | Status: DC
  Filled 2015-03-13: qty 1

## 2015-03-13 MED ORDER — ASPIRIN EC 81 MG PO TBEC
81.0000 mg | DELAYED_RELEASE_TABLET | Freq: Two times a day (BID) | ORAL | Status: DC
  Administered 2015-03-13: 81 mg via ORAL
  Filled 2015-03-13 (×3): qty 1

## 2015-03-13 MED ORDER — NITROGLYCERIN 0.4 MG SL SUBL: 0.4000 mg | SUBLINGUAL_TABLET | SUBLINGUAL | Status: DC | PRN

## 2015-03-13 NOTE — Discharge Instructions (Signed)
Atrial Fibrillation  Atrial fibrillation is a condition that causes your heart to beat irregularly. It may also cause your heart to beat faster than normal. Atrial fibrillation can prevent your heart from pumping blood normally. It increases your risk of stroke and heart problems.  HOME CARE  · Take medications as told by your doctor.  · Only take medications that your doctor says are safe. Some medications can make the condition worse or happen again.  · If blood thinners were prescribed by your doctor, take them exactly as told. Too much can cause bleeding. Too little and you will not have the needed protection against stroke and other problems.  · Perform blood tests at home if told by your doctor.  · Perform blood tests exactly as told by your doctor.  · Do not drink alcohol.  · Do not drink beverages with caffeine such as coffee, soda, and some teas.  · Maintain a healthy weight.  · Do not use diet pills unless your doctor says they are safe. They may make heart problems worse.  · Follow diet instructions as told by your doctor.  · Exercise regularly as told by your doctor.  · Keep all follow-up appointments.  GET HELP IF:  · You notice a change in the speed, rhythm, or strength of your heartbeat.  · You suddenly begin peeing (urinating) more often.  · You get tired more easily when moving or exercising.  GET HELP RIGHT AWAY IF:   · You have chest or belly (abdominal) pain.  · You feel sick to your stomach (nauseous).  · You are short of breath.  · You suddenly have swollen feet and ankles.  · You feel dizzy.  · You face, arms, or legs feel numb or weak.  · There is a change in your vision or speech.  MAKE SURE YOU:   · Understand these instructions.  · Will watch your condition.  · Will get help right away if you are not doing well or get worse.  Document Released: 06/08/2008 Document Revised: 01/14/2014 Document Reviewed: 10/10/2012  [employer]® Patient Information ©2015 [employer]. This information is not  intended to replace advice given to you by your health care provider. Make sure you discuss any questions you have with your health care provider.

## 2015-03-13 NOTE — Progress Notes (Signed)
Patient transported to 3e20 by stretcher from emergency department.  No complaints of pain. Weight done on standing scale.  Able to answer all admission questions.  A&Ox4. Daughter at bedside.  Will continue to monitor.

## 2015-03-13 NOTE — H&P (Signed)
[name] is an 74 y.o. male.   Chief Complaint: Chest pain, A.fib HPI: [name]  is a 74 y.o. male with a history of HTN, hyperlipidemia, diabetes with CKD stage III, permanent atrial fibrillation, and known coronary artery disease and myocardial infarction, s/p CABG status. He presented to the ED last night with chest pain that began around 9:30 at rest. Pt noted to be in a.fib with RVR on arrival to ED.  Pt admits to stopping all of his medications 3-4 days ago. He is not out of medications and denies any intolerance, just states he stopped taking them. Presently feeling well as rate as improved. No SOB or edema. No significant weight gain.  Daughter at bedside.    Past Medical History  Diagnosis Date  . CAD (coronary artery disease)   . Acute non-ST segment elevation myocardial infarction   . Left renal mass   . Diverticulitis   . HTN (hypertension)   . Myocardial infarction 06/19/2011  . Ulcer 1960's    "stomach"  . Renal neoplasm     LEFT  . Blood transfusion     "related to surgeries"  . Diverticulitis 1996  . CHF (congestive heart failure)   . High cholesterol   . Type II diabetes mellitus   . Arthritis     "hands" (09/16/2014)  . COPD (chronic obstructive pulmonary disease)     Past Surgical History  Procedure Laterality Date  . Coronary artery bypass graft  06/2011    4 VESSELS  . Colon resection  2005    FOR DIVERTICULITIS  . Robot assisted laparoscopic nephrectomy  11/29/2011    Procedure: ROBOTIC ASSISTED LAPAROSCOPIC NEPHRECTOMY;  Surgeon: Dutch Gray, MD;  Location: [facility];  Service: Urology;  Laterality: Left;  PARTIAL left nephrectomy    . Cardioversion  01/11/2012    Procedure: CARDIOVERSION;  Surgeon: Laverda Page, MD;  Location: [facility];  Service: Cardiovascular;  Laterality: N/A;  . Colonoscopy  02/02/2012    Procedure: COLONOSCOPY;  Surgeon: Jeryl Columbia, MD;  Location: [facility];  Service: Endoscopy;  Laterality: N/A;  magod /ja  .  Cholecystectomy    . Hernia repair Bilateral X 4    "twice on each side"  . Colon surgery    . Cataract extraction w/ intraocular lens  implant, bilateral Bilateral     Family History  Problem Relation Age of Onset  . Heart disease Father   . Alzheimer's disease Mother   . Alcohol abuse Brother    Social History:  reports that he quit smoking about 3 years ago. His smoking use included Cigarettes. He has a 26 pack-year smoking history. He has never used smokeless tobacco. He reports that he does not drink alcohol or use illicit drugs.  Allergies: No Known Allergies  Review of Systems - General ROS: negative for - fatigue, malaise or weight gain Respiratory ROS: no cough, shortness of breath, or wheezing Cardiovascular ROS: no chest pain or dyspnea on exertion Chest pain has resolved Gastrointestinal ROS: no abdominal pain, change in bowel habits, or black or bloody stools Neurological ROS: no TIA or stroke symptoms  Blood pressure 152/81, pulse 61, temperature 97.5 F (36.4 C), temperature source Oral, resp. rate 20, height 6' (1.829 m), weight 87.499 kg (192 lb 14.4 oz), SpO2 97 %. General appearance: alert, cooperative, appears stated age, no distress and mildly obese Eyes: negative Neck: no adenopathy, no carotid bruit, no JVD, supple, symmetrical, trachea midline and thyroid not enlarged, symmetric,  no tenderness/mass/nodules Neck: JVP - normal, carotids 2+= without bruits Resp: rhonchi bilaterally Chest wall: no tenderness Cardio: irregularly irregular rhythm, S1: split and variable, S2: normal and no murmur GI: soft, non-tender; bowel sounds normal; no masses,  no organomegaly Extremities: extremities normal, atraumatic, no cyanosis or edema Pulses: LE peripheral pulses faint bilaterally Skin: Skin color, texture, turgor normal. No rashes or lesions Neurologic: Grossly normal  Results for orders placed or performed during the hospital encounter of 03/12/15 (from the past  48 hour(s))  CBC with Differential/Platelet     Status: Abnormal   Collection Time: 03/12/15 11:49 PM  Result Value Ref Range   WBC 8.7 4.0 - 10.5 K/uL   RBC 5.88 (H) 4.22 - 5.81 MIL/uL   Hemoglobin 18.1 (H) 13.0 - 17.0 g/dL   HCT 51.4 39.0 - 52.0 %   MCV 87.4 78.0 - 100.0 fL   MCH 30.8 26.0 - 34.0 pg   MCHC 35.2 30.0 - 36.0 g/dL   RDW 14.8 11.5 - 15.5 %   Platelets 159 150 - 400 K/uL   Neutrophils Relative % 74 43 - 77 %   Neutro Abs 6.5 1.7 - 7.7 K/uL   Lymphocytes Relative 18 12 - 46 %   Lymphs Abs 1.5 0.7 - 4.0 K/uL   Monocytes Relative 8 3 - 12 %   Monocytes Absolute 0.7 0.1 - 1.0 K/uL   Eosinophils Relative 0 0 - 5 %   Eosinophils Absolute 0.0 0.0 - 0.7 K/uL   Basophils Relative 0 0 - 1 %   Basophils Absolute 0.0 0.0 - 0.1 K/uL  Comprehensive metabolic panel     Status: Abnormal   Collection Time: 03/12/15 11:49 PM  Result Value Ref Range   Sodium 133 (L) 135 - 145 mmol/L   Potassium 4.0 3.5 - 5.1 mmol/L   Chloride 103 101 - 111 mmol/L   CO2 19 (L) 22 - 32 mmol/L   Glucose, Bld 183 (H) 65 - 99 mg/dL   BUN 17 6 - 20 mg/dL   Creatinine, Ser 1.20 0.61 - 1.24 mg/dL   Calcium 8.8 (L) 8.9 - 10.3 mg/dL   Total Protein 7.4 6.5 - 8.1 g/dL   Albumin 3.7 3.5 - 5.0 g/dL   AST 23 15 - 41 U/L   ALT 18 17 - 63 U/L   Alkaline Phosphatase 39 38 - 126 U/L   Total Bilirubin 0.5 0.3 - 1.2 mg/dL   GFR calc non Af Amer 57 (L) >60 mL/min   GFR calc Af Amer >60 >60 mL/min    Comment: (NOTE) The eGFR has been calculated using the CKD EPI equation. This calculation has not been validated in all clinical situations. eGFR's persistently <60 mL/min signify possible Chronic Kidney Disease.    Anion gap 11 5 - 15  Brain natriuretic peptide     Status: Abnormal   Collection Time: 03/12/15 11:49 PM  Result Value Ref Range   B Natriuretic Peptide 239.1 (H) 0.0 - 100.0 pg/mL  Troponin I     Status: None   Collection Time: 03/12/15 11:49 PM  Result Value Ref Range   Troponin I 0.03 <0.031  ng/mL    Comment:        NO INDICATION OF MYOCARDIAL INJURY.   Protime-INR     Status: None   Collection Time: 03/12/15 11:49 PM  Result Value Ref Range   Prothrombin Time 15.0 11.6 - 15.2 seconds   INR 1.16 0.00 - 1.49  APTT  Status: None   Collection Time: 03/12/15 11:49 PM  Result Value Ref Range   aPTT 30 24 - 37 seconds  Urinalysis, Routine w reflex microscopic (not at [facility])     Status: Abnormal   Collection Time: 03/13/15  6:39 AM  Result Value Ref Range   Color, Urine AMBER (A) YELLOW    Comment: BIOCHEMICALS MAY BE AFFECTED BY COLOR   APPearance CLEAR CLEAR   Specific Gravity, Urine 1.026 1.005 - 1.030   pH 5.5 5.0 - 8.0   Glucose, UA NEGATIVE NEGATIVE mg/dL   Hgb urine dipstick NEGATIVE NEGATIVE   Bilirubin Urine SMALL (A) NEGATIVE   Ketones, ur NEGATIVE NEGATIVE mg/dL   Protein, ur 100 (A) NEGATIVE mg/dL   Urobilinogen, UA 0.2 0.0 - 1.0 mg/dL   Nitrite NEGATIVE NEGATIVE   Leukocytes, UA NEGATIVE NEGATIVE  Urine microscopic-add on     Status: Abnormal   Collection Time: 03/13/15  6:39 AM  Result Value Ref Range   Squamous Epithelial / LPF RARE RARE   WBC, UA 0-2 <3 WBC/hpf   RBC / HPF 0-2 <3 RBC/hpf   Casts HYALINE CASTS (A) NEGATIVE   Urine-Other MUCOUS PRESENT    Dg Chest Port 1 View  03/13/2015   CLINICAL DATA:  Chest pain and dyspnea.  EXAM: PORTABLE CHEST - 1 VIEW  COMPARISON:  01/22/2015  FINDINGS: A single AP portable view of the chest demonstrates no focal airspace consolidation or alveolar edema. The lungs are grossly clear. There is no large effusion or pneumothorax. Cardiac and mediastinal contours appear unremarkable.  IMPRESSION: No active disease.   Electronically Signed   By: Andreas Newport M.D.   On: 03/13/2015 00:00    Labs:   Lab Results  Component Value Date   WBC 8.7 03/12/2015   HGB 18.1* 03/12/2015   HCT 51.4 03/12/2015   MCV 87.4 03/12/2015   PLT 159 03/12/2015    Recent Labs Lab 03/12/15 2349  NA 133*  K 4.0  CL 103   CO2 19*  BUN 17  CREATININE 1.20  CALCIUM 8.8*  PROT 7.4  BILITOT 0.5  ALKPHOS 39  ALT 18  AST 23  GLUCOSE 183*    Lipid Panel     Component Value Date/Time   CHOL 132 06/20/2011 0530   TRIG 147 06/20/2011 0530   HDL 27* 06/20/2011 0530   CHOLHDL 4.9 06/20/2011 0530   VLDL 29 06/20/2011 0530   LDLCALC 76 06/20/2011 0530    BNP (last 3 results)  Recent Labs  09/18/14 0500 09/19/14 0438 03/12/15 2349  BNP 88.4 75.9 239.1*    ProBNP (last 3 results)  Recent Labs  08/29/14 1803 08/29/14 2140  PROBNP 1356.0* 1294.0*    HEMOGLOBIN A1C Lab Results  Component Value Date   HGBA1C 6.9* 08/29/2014   MPG 151* 08/29/2014    Cardiac Panel (last 3 results)  Recent Labs  08/29/14 1539 03/12/15 2349  CKTOTAL 111  --   TROPONINI  --  0.03    Lab Results  Component Value Date   CKTOTAL 111 08/29/2014   CKMB 3.7 10/27/2011   TROPONINI 0.03 03/12/2015     TSH  Recent Labs  08/29/14 1803 08/29/14 2140 09/16/14 1250  TSH 1.690 0.993 1.466   EKG 03/12/2015: Atrial fibrillation with rapid ventricular response at the rate of 126 bpm, rightward axis, right bundle branch block. Cannot exclude inferior infarct old.   Telemetry monitor 03/13/2015: A. Fib with controlled ventricular response at a rate of 60-80.  Medications Prior to Admission  Medication Sig Dispense Refill  . albuterol (PROVENTIL HFA;VENTOLIN HFA) 108 (90 BASE) MCG/ACT inhaler Inhale 1-2 puffs into the lungs every 4 (four) hours as needed for wheezing or shortness of breath.    Marland Kitchen azelastine (ASTELIN) 0.1 % nasal spray Place 1 spray into both nostrils daily as needed for allergies.   0  . fluticasone (FLOVENT HFA) 110 MCG/ACT inhaler Inhale 1 puff into the lungs 2 (two) times daily. 1 Inhaler 3  . ketoconazole (NIZORAL) 2 % cream Apply 1 application topically daily. Apply to rash on feet    . nitroGLYCERIN (NITROSTAT) 0.4 MG SL tablet Place 0.4 mg under the tongue every 5 (five) minutes x 3  doses as needed for chest pain. For chest pain    . spironolactone (ALDACTONE) 50 MG tablet Take 50 mg by mouth daily.    Marland Kitchen aspirin EC 81 MG tablet Take 81 mg by mouth 2 (two) times daily.    . Carboxymethylcell-Hypromellose (GENTEAL OP) Place 1 drop into both eyes 2 (two) times daily as needed (dry eye).    Marland Kitchen digoxin (LANOXIN) 0.125 MG tablet Take 0.125 mg by mouth daily.    . fenofibrate (TRICOR) 145 MG tablet Take 145 mg by mouth daily after breakfast.     . lisinopril (PRINIVIL,ZESTRIL) 2.5 MG tablet Take 2.5 mg by mouth daily after breakfast.     . metFORMIN (GLUCOPHAGE) 1000 MG tablet Take 1,000 mg by mouth 2 (two) times daily with a meal.    . metoprolol (LOPRESSOR) 50 MG tablet Take 50 mg by mouth 2 (two) times daily.     . montelukast (SINGULAIR) 10 MG tablet Take 1 tablet by mouth daily.  5  . pravastatin (PRAVACHOL) 40 MG tablet Take 40 mg by mouth at bedtime.     . traMADol (ULTRAM) 50 MG tablet Take 50 mg by mouth every 8 (eight) hours as needed for severe pain.         Current facility-administered medications:  .  aspirin EC tablet 81 mg, 81 mg, Oral, BID, Julianne Rice, MD, 81 mg at 03/13/15 0353 .  budesonide (PULMICORT) nebulizer solution 0.25 mg, 0.25 mg, Nebulization, BID, Adrian Prows, MD .  digoxin (LANOXIN) tablet 0.125 mg, 0.125 mg, Oral, Daily, Julianne Rice, MD .  feeding supplement (ENSURE ENLIVE) (ENSURE ENLIVE) liquid 237 mL, 237 mL, Oral, BID BM, Adrian Prows, MD .  lisinopril (PRINIVIL,ZESTRIL) tablet 2.5 mg, 2.5 mg, Oral, QPC breakfast, Julianne Rice, MD .  metFORMIN (GLUCOPHAGE) tablet 1,000 mg, 1,000 mg, Oral, BID WC, Julianne Rice, MD .  metoprolol tartrate (LOPRESSOR) tablet 50 mg, 50 mg, Oral, BID, Julianne Rice, MD, 50 mg at 03/13/15 0353 .  nitroGLYCERIN (NITROSTAT) SL tablet 0.4 mg, 0.4 mg, Sublingual, Q5 Min x 3 PRN, Julianne Rice, MD .  pravastatin (PRAVACHOL) tablet 40 mg, 40 mg, Oral, QHS, Julianne Rice, MD .  spironolactone (ALDACTONE)  tablet 25 mg, 25 mg, Oral, Daily, Julianne Rice, MD .  torsemide [facility]) tablet 20 mg, 20 mg, Oral, Daily, Julianne Rice, MD    Assessment/Plan 1. Chest pain 2. 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 Patient's choice. 3. Chronic diastolic heart failure 4. CAD without angina 5. Chronic cor pulmonale and COPD 6. DM with CKD stage II  Recommendation: Pt's symptoms likely due to a.fib with RVR due to non-compliance with medications. Symptoms have resolved with rate control. No evidence of acute, decompensated heart failure.  Do not suspect ACS.  Medication and diet compliance stressed.  Will discharge home today with outpatient follow up next week.    Rachel Bo, NP-C 03/13/2015, 8:02 AM [facility] [place] Pager: [phone] Office: [phone]

## 2015-03-13 NOTE — Discharge Summary (Signed)
Physician Discharge Summary  Patient ID: [name] MRN: [id number] DOB/AGE: September 20, 1940 74 y.o.  Admit date: 03/12/2015 Discharge date: 03/13/2015  Primary Discharge Diagnosis: A.fib, chronic diastolic HF  Hospital Course: See HPI  Recommendations on discharge: Follow up outpatient in 1 week. Compliance with medications and diet stressed.  Discharge Exam: Blood pressure 152/81, pulse 61, temperature 97.5 F (36.4 C), temperature source Oral, resp. rate 20, height 6' (1.829 m), weight 87.499 kg (192 lb 14.4 oz), SpO2 97 %.    See HPI for ROS and PE  Labs:   Lab Results  Component Value Date   WBC 8.7 03/12/2015   HGB 18.1* 03/12/2015   HCT 51.4 03/12/2015   MCV 87.4 03/12/2015   PLT 159 03/12/2015    Recent Labs Lab 03/12/15 2349  NA 133*  K 4.0  CL 103  CO2 19*  BUN 17  CREATININE 1.20  CALCIUM 8.8*  PROT 7.4  BILITOT 0.5  ALKPHOS 39  ALT 18  AST 23  GLUCOSE 183*    Lipid Panel     Component Value Date/Time   CHOL 132 06/20/2011 0530   TRIG 147 06/20/2011 0530   HDL 27* 06/20/2011 0530   CHOLHDL 4.9 06/20/2011 0530   VLDL 29 06/20/2011 0530   LDLCALC 76 06/20/2011 0530    BNP (last 3 results)  Recent Labs  09/18/14 0500 09/19/14 0438 03/12/15 2349  BNP 88.4 75.9 239.1*    ProBNP (last 3 results)  Recent Labs  08/29/14 1803 08/29/14 2140  PROBNP 1356.0* 1294.0*    HEMOGLOBIN A1C Lab Results  Component Value Date   HGBA1C 6.9* 08/29/2014   MPG 151* 08/29/2014    Cardiac Panel (last 3 results)  Recent Labs  08/29/14 1539 03/12/15 2349  CKTOTAL 111  --   TROPONINI  --  0.03    Lab Results  Component Value Date   CKTOTAL 111 08/29/2014   CKMB 3.7 10/27/2011   TROPONINI 0.03 03/12/2015     TSH  Recent Labs  08/29/14 1803 08/29/14 2140 09/16/14 1250  TSH 1.690 0.993 1.466     Radiology: Dg Chest Port 1 View  03/13/2015   CLINICAL DATA:  Chest pain and dyspnea.  EXAM: PORTABLE CHEST - 1 VIEW  COMPARISON:   01/22/2015  FINDINGS: A single AP portable view of the chest demonstrates no focal airspace consolidation or alveolar edema. The lungs are grossly clear. There is no large effusion or pneumothorax. Cardiac and mediastinal contours appear unremarkable.  IMPRESSION: No active disease.   Electronically Signed   By: Andreas Newport M.D.   On: 03/13/2015 00:00      FOLLOW UP PLANS AND APPOINTMENTS Discharge Instructions    Discharge patient    Complete by:  As directed             Medication List    TAKE these medications        albuterol 108 (90 BASE) MCG/ACT inhaler  Commonly known as:  PROVENTIL HFA;VENTOLIN HFA  Inhale 1-2 puffs into the lungs every 4 (four) hours as needed for wheezing or shortness of breath.     aspirin EC 81 MG tablet  Take 81 mg by mouth 2 (two) times daily.     azelastine 0.1 % nasal spray  Commonly known as:  ASTELIN  Place 1 spray into both nostrils daily as needed for allergies.     digoxin 0.125 MG tablet  Commonly known as:  LANOXIN  Take 0.125 mg by mouth daily.  fenofibrate 145 MG tablet  Commonly known as:  TRICOR  Take 145 mg by mouth daily after breakfast.     fluticasone 110 MCG/ACT inhaler  Commonly known as:  FLOVENT HFA  Inhale 1 puff into the lungs 2 (two) times daily.     GENTEAL OP  Place 1 drop into both eyes 2 (two) times daily as needed (dry eye).     ketoconazole 2 % cream  Commonly known as:  NIZORAL  Apply 1 application topically daily. Apply to rash on feet     lisinopril 2.5 MG tablet  Commonly known as:  PRINIVIL,ZESTRIL  Take 2.5 mg by mouth daily after breakfast.     metFORMIN 1000 MG tablet  Commonly known as:  GLUCOPHAGE  Take 1,000 mg by mouth 2 (two) times daily with a meal.     metoprolol 50 MG tablet  Commonly known as:  LOPRESSOR  Take 50 mg by mouth 2 (two) times daily.     montelukast 10 MG tablet  Commonly known as:  SINGULAIR  Take 1 tablet by mouth daily.     nitroGLYCERIN 0.4 MG SL tablet   Commonly known as:  NITROSTAT  Place 0.4 mg under the tongue every 5 (five) minutes x 3 doses as needed for chest pain. For chest pain     pravastatin 40 MG tablet  Commonly known as:  PRAVACHOL  Take 40 mg by mouth at bedtime.     spironolactone 50 MG tablet  Commonly known as:  ALDACTONE  Take 50 mg by mouth daily.     traMADol 50 MG tablet  Commonly known as:  ULTRAM  Take 50 mg by mouth every 8 (eight) hours as needed for severe pain.           Follow-up Information    Follow up with Adrian Prows, MD In 1 week.   Specialty:  Cardiology   Contact information:   [address] [place] [address] [phone]        Rachel Bo, NP-C 03/13/2015, 8:18 AM  Pager: [phone] Office: [phone]

## 2015-03-20 ENCOUNTER — Other Ambulatory Visit: Payer: Self-pay | Admitting: Family Medicine

## 2015-03-20 ENCOUNTER — Ambulatory Visit
Admission: RE | Admit: 2015-03-20 | Discharge: 2015-03-20 | Disposition: A | Discharge status: Home / Self Care | Payer: Medicare Other | Source: Ambulatory Visit | Attending: Family Medicine | Admitting: Family Medicine

## 2015-03-20 DIAGNOSIS — R1011 Right upper quadrant pain: Secondary | ICD-10-CM

## 2015-03-27 ENCOUNTER — Ambulatory Visit: Payer: Self-pay | Admitting: Surgery

## 2015-04-03 ENCOUNTER — Encounter (HOSPITAL_COMMUNITY): Payer: Self-pay

## 2015-04-03 ENCOUNTER — Encounter (HOSPITAL_COMMUNITY)
Admission: RE | Admit: 2015-04-03 | Discharge: 2015-04-03 | Disposition: A | Discharge status: Home / Self Care | Payer: Medicare Other | Source: Ambulatory Visit | Attending: Surgery | Admitting: Surgery

## 2015-04-03 DIAGNOSIS — Z7982 Long term (current) use of aspirin: Secondary | ICD-10-CM | POA: Insufficient documentation

## 2015-04-03 DIAGNOSIS — I10 Essential (primary) hypertension: Secondary | ICD-10-CM | POA: Diagnosis not present

## 2015-04-03 DIAGNOSIS — J449 Chronic obstructive pulmonary disease, unspecified: Secondary | ICD-10-CM | POA: Insufficient documentation

## 2015-04-03 DIAGNOSIS — Z85528 Personal history of other malignant neoplasm of kidney: Secondary | ICD-10-CM | POA: Insufficient documentation

## 2015-04-03 DIAGNOSIS — I252 Old myocardial infarction: Secondary | ICD-10-CM | POA: Insufficient documentation

## 2015-04-03 DIAGNOSIS — Z951 Presence of aortocoronary bypass graft: Secondary | ICD-10-CM | POA: Insufficient documentation

## 2015-04-03 DIAGNOSIS — Z905 Acquired absence of kidney: Secondary | ICD-10-CM | POA: Insufficient documentation

## 2015-04-03 DIAGNOSIS — Z01818 Encounter for other preprocedural examination: Secondary | ICD-10-CM | POA: Diagnosis not present

## 2015-04-03 DIAGNOSIS — Z01812 Encounter for preprocedural laboratory examination: Secondary | ICD-10-CM | POA: Insufficient documentation

## 2015-04-03 DIAGNOSIS — K8010 Calculus of gallbladder with chronic cholecystitis without obstruction: Secondary | ICD-10-CM | POA: Diagnosis not present

## 2015-04-03 DIAGNOSIS — I2510 Atherosclerotic heart disease of native coronary artery without angina pectoris: Secondary | ICD-10-CM | POA: Insufficient documentation

## 2015-04-03 DIAGNOSIS — I5032 Chronic diastolic (congestive) heart failure: Secondary | ICD-10-CM | POA: Diagnosis not present

## 2015-04-03 DIAGNOSIS — I482 Chronic atrial fibrillation: Secondary | ICD-10-CM | POA: Insufficient documentation

## 2015-04-03 DIAGNOSIS — E119 Type 2 diabetes mellitus without complications: Secondary | ICD-10-CM | POA: Diagnosis not present

## 2015-04-03 DIAGNOSIS — Z79899 Other long term (current) drug therapy: Secondary | ICD-10-CM | POA: Diagnosis not present

## 2015-04-03 DIAGNOSIS — E780 Pure hypercholesterolemia: Secondary | ICD-10-CM | POA: Diagnosis not present

## 2015-04-03 HISTORY — DX: Cardiac arrhythmia, unspecified: I49.9

## 2015-04-03 HISTORY — DX: Malignant (primary) neoplasm, unspecified: C80.1

## 2015-04-03 LAB — CBC
HCT: 48.4 % (ref 39.0–52.0)
HEMOGLOBIN: 16.6 g/dL (ref 13.0–17.0)
MCH: 31 pg (ref 26.0–34.0)
MCHC: 34.3 g/dL (ref 30.0–36.0)
MCV: 90.3 fL (ref 78.0–100.0)
PLATELETS: 142 10*3/uL — AB (ref 150–400)
RBC: 5.36 MIL/uL (ref 4.22–5.81)
RDW: 15.8 % — AB (ref 11.5–15.5)
WBC: 6.5 10*3/uL (ref 4.0–10.5)

## 2015-04-03 LAB — GLUCOSE, CAPILLARY: Glucose-Capillary: 173 mg/dL — ABNORMAL HIGH (ref 65–99)

## 2015-04-03 LAB — BASIC METABOLIC PANEL
ANION GAP: 12 (ref 5–15)
BUN: 19 mg/dL (ref 6–20)
CHLORIDE: 103 mmol/L (ref 101–111)
CO2: 19 mmol/L — AB (ref 22–32)
CREATININE: 1.29 mg/dL — AB (ref 0.61–1.24)
Calcium: 9 mg/dL (ref 8.9–10.3)
GFR, EST NON AFRICAN AMERICAN: 52 mL/min — AB (ref >60)
GLUCOSE: 141 mg/dL — AB (ref 65–99)
POTASSIUM: 4.8 mmol/L (ref 3.5–5.1)
Sodium: 134 mmol/L — ABNORMAL LOW (ref 135–145)

## 2015-04-03 NOTE — Progress Notes (Signed)
Dr. Gala Lewandowsky office faxed over cardiac clearance from Dr. Irven Shelling office.  Per Dr. Irven Shelling note, patient is to continue his Aspirin 81 mg,  prior to surgery.  Patient verbalized understanding.

## 2015-04-03 NOTE — Pre-Procedure Instructions (Addendum)
    [name]  04/03/2015      [id number] [address], [place] - [address] [place] [address] Phone: [phone] Fax: [phone]  [place], [place] [address] [place] [address] Phone: [phone] Fax: [phone]    Your procedure is scheduled on Tuesday, July 26th, 2016 at 11:00 AM.  Report to [facility] Admitting at 9:00 A.M.  Call this number if you have problems the morning of surgery:  [phone]   Remember:  Do not eat food or drink liquids after midnight.   Take these medicines the morning of surgery with A SIP OF WATER: Albuterol inhaler if needed (please bring with you), Azelastine Nasal spray, Carboxymethycell-Hypromellose (eye drop) if needed, Digoxin (Lanoxin), Fluticasone inhaler, Metoprolol (Lopressor), Tramadol (Ultram) if needed, Aspirin (per MD)  Do not take Metformin the morning of surgery.   Stop taking: NSAIDs, Ibuprofen, BC's, Goody's, Naproxen, Aleve, Fish Oil, all herbal medications, and all vitamins.    Do not wear jewelry.  Do not wear lotions, powders, or colognes.  You may wear deodorant.  Men may shave face and neck.  Do not bring valuables to the hospital.  [facility] is not responsible for any belongings or valuables.  Contacts, dentures or bridgework may not be worn into surgery.  Leave your suitcase in the car.  After surgery it may be brought to your room.  For patients admitted to the hospital, discharge time will be determined by your treatment team.  Patients discharged the day of surgery will not be allowed to drive home.   Special instructions:  See attached.  Please read over the following fact sheets that you were given. Pain Booklet, Coughing and Deep Breathing and Surgical Site Infection Prevention

## 2015-04-03 NOTE — Progress Notes (Signed)
   04/03/15 1556  OBSTRUCTIVE SLEEP APNEA  Have you ever been diagnosed with sleep apnea through a sleep study? No  Do you snore loudly (loud enough to be heard through closed doors)?  0  Do you often feel tired, fatigued, or sleepy during the daytime? 1  Has anyone observed you stop breathing during your sleep? 0  Do you have, or are you being treated for high blood pressure? 1  BMI more than 35 kg/m2? 0  Age over 76 years old? 1  Neck circumference greater than 40 cm/16 inches? 1  Gender: 1  Obstructive Sleep Apnea Score 5

## 2015-04-03 NOTE — Progress Notes (Signed)
PCP- Dr. Donnie Coffin Cardiologist - Dr. Einar Gip  EKG- 03/14/15 - Epic CXR- 01/22/15 -Epic Echo- 08/2014 - Epic Stress Test - 01/2013 - report in cardiac clearance note from Dr. Einar Gip in chart Cardiac Cath - denies

## 2015-04-04 LAB — HEMOGLOBIN A1C
Hgb A1c MFr Bld: 7.3 % — ABNORMAL HIGH (ref 4.8–5.6)
Mean Plasma Glucose: 163 mg/dL

## 2015-04-04 NOTE — Progress Notes (Signed)
Anesthesia Chart Review: Patient is a 74 year old male scheduled for laparoscopic cholecystectomy on 04/08/2015 by Dr. Harlow Asa.  History includes smoking, CAD/NSTEMI s/p CABG 06/28/11 (LIMA to LAD, SVG to CX, SVG to PDA and PLA), atrial fibrillation s/p cardioversion '14 with admission by Dr. Einar Gip for afib with RVR 03/12/15 (in the setting of missed medications for 3-4 days), chronic diastolic CHF, DM2, HTN, hypercholesterolemia, COPD, renal cancer s/p left nephrectomy '13, cholecystectomy, colon resection '05. PCP is listed as Dr. Donavan Burnet.  Cardiologist is Dr. Einar Gip. On 03/27/15, he felt patient was acceptable CV risk for surgery but recommended staying on ASA.   Meds include albuterol, ASA 81 mg, digoxin, Tricor, Flovent, lisinopril, metformin, Lopressor, Nitro, pravastatin, Aldactone, Ultram. He is not on anticoagulation (other that ASA) for afib due to recurrent GI bleed from colonic AVM ("patient's choice").    03/12/15 afib at 126 bpm, right BBB, LPFB. HR was 67 bpm at PAT.   08/30/14 Echo: Study Conclusions - Left ventricle: The cavity size was normal. Systolic function was mildly to moderately reduced. The estimated ejection fraction was in the range of 40% to 45%. Akinesis of the inferior myocardium. The study is not technically sufficient to allow evaluation of LV diastolic function. - Ventricular septum: Septal motion showed paradox. - Aortic valve: There was trivial regurgitation. - Left atrium: The atrium was moderately to severely dilated. - Right atrium: The atrium was mildly dilated. - Atrial septum: No defect or patent foramen ovale was identified.  According to Dr. Irven Shelling 01/16/15 office note, Lexiscan stress on 01/17/13 showed: Resting EKG showed atrial fibrillation with RVR at 107 bpm. Right bundle branch block, nonspecific ST/T changes, PVC. Stress EKG was nondiagnostic for ischemia. No ST/T changes of ischemia noted with pharmacologic stress testing. The  perfusion study demonstrated a large fixed inferior and inferolateral scar from the base to the apex. There was no significant peri-infarct ischemia. Left ventricular EF was estimated to be 19%, however the EF estimation may be erroneous due to the difficulty in OGS gaiting due to atrial fibrillation. This represents a low risk study.  03/12/15 1V CXR: IMPRESSION: No active disease.  Preoperative labs noted. A1C 7.3.   HR was in the 60's at PAT. He has known chronic afib. Clearance obtained by his cardiologist. If no acute changes then I anticipate that he can proceed as planned.  George Hugh [facility]/Anesthesiology Phone [phone] 04/04/2015 1:30 PM

## 2015-04-07 ENCOUNTER — Encounter (HOSPITAL_COMMUNITY): Payer: Self-pay | Admitting: Surgery

## 2015-04-07 DIAGNOSIS — K8010 Calculus of gallbladder with chronic cholecystitis without obstruction: Secondary | ICD-10-CM | POA: Diagnosis present

## 2015-04-07 NOTE — Progress Notes (Signed)
I spoke with [name] and informed him of new arrival time of 0530, for OR at 0730.

## 2015-04-07 NOTE — H&P (Signed)
General Surgery [facility]  [name] DOB: 1941/01/13 Male  History of Present Illness   The patient is a 74 year old male who presents for evaluation of gall stones. Patient is referred by Dr. Clarene Essex for evaluation of symptomatic cholelithiasis and right upper quadrant abdominal pain. Patient had a known history of cholelithiasis noted on CT scan in May 2016. Patient denies any symptoms prior to 2 weeks ago. He began having right upper quadrant abdominal pain which has waxed and waned in severity. He has had some nausea but no emesis. He denies jaundice or acholic stools. He denies fevers or chills. He does not note any particular food which has caused exacerbation of his pain. Patient denies any prior hepatobiliary or pancreatic disease. Previous abdominal surgery includes partial colectomy for diverticular disease in 2000 and partial left nephrectomy in 2013. Patient also underwent cardiac bypass surgery in 2012. Patient presented to the emergency department with right upper quadrant abdominal and chest pain within the past 2 weeks. He underwent a thorough evaluation to rule out cardiac etiology. He was then referred to gastroenterology and the ultrasound was obtained showing cholelithiasis. Patient now presents to surgery to consider cholecystectomy.   Other Problems Atrial Fibrillation Cancer Cholelithiasis Congestive Heart Failure Diabetes Mellitus Diverticulosis High blood pressure Hypercholesterolemia Inguinal Hernia Myocardial infarction  Past Surgical History Colon Removal - Partial Coronary Artery Bypass Graft Nephrectomy Left. Resection of Stomach  Diagnostic Studies History Colonoscopy 1-5 years ago  Allergies No Known Allergies07/14/2016  Medication History TraMADol HCl (50MG  Tablet, Oral as needed) Active. Digox (125MCG Tablet, Oral) Active. Fenofibrate (145MG  Tablet, Oral) Active. Ketoconazole (2%  Cream, External) Active. Lisinopril (2.5MG  Tablet, Oral) Active. MetFORMIN HCl (1000MG  Tablet, Oral) Active. Metoprolol Tartrate (50MG  Tablet, Oral) Active. Montelukast Sodium (10MG  Tablet, Oral) Active. Pravastatin Sodium (40MG  Tablet, Oral) Active. Spironolactone (50MG  Tablet, Oral) Active. Aspirin (81MG  Tablet DR, Oral) Active. Flovent (110MCG/ACT Aerosol, Inhalation) Active. Nitrostat (0.4MG  Tab Sublingual, Sublingual as needed) Active. Medications Reconciled  Social History No alcohol use No drug use Tobacco use Current every day smoker.  Family History Alcohol Abuse Brother.  Review of Systems General Present- Fatigue and Night Sweats. Not Present- Appetite Loss, Chills, Fever, Weight Gain and Weight Loss. Skin Not Present- Change in Wart/Mole, Dryness, Hives, Jaundice, New Lesions, Non-Healing Wounds, Rash and Ulcer. HEENT Present- Wears glasses/contact lenses. Not Present- Earache, Hearing Loss, Hoarseness, Nose Bleed, Oral Ulcers, Ringing in the Ears, Seasonal Allergies, Sinus Pain, Sore Throat, Visual Disturbances and Yellow Eyes. Respiratory Not Present- Bloody sputum, Chronic Cough, Difficulty Breathing, Snoring and Wheezing. Breast Not Present- Breast Mass, Breast Pain, Nipple Discharge and Skin Changes. Cardiovascular Present- Swelling of Extremities. Not Present- Chest Pain, Difficulty Breathing Lying Down, Leg Cramps, Palpitations, Rapid Heart Rate and Shortness of Breath. Male Genitourinary Not Present- Blood in Urine, Change in Urinary Stream, Frequency, Impotence, Nocturia, Painful Urination, Urgency and Urine Leakage. Musculoskeletal Not Present- Back Pain, Joint Pain, Joint Stiffness, Muscle Pain, Muscle Weakness and Swelling of Extremities. Neurological Not Present- Decreased Memory, Fainting, Headaches, Numbness, Seizures, Tingling, Tremor, Trouble walking and Weakness. Psychiatric Not Present- Anxiety, Bipolar, Change in Sleep Pattern, Depression,  Fearful and Frequent crying. Endocrine Not Present- Cold Intolerance, Excessive Hunger, Hair Changes, Heat Intolerance, Hot flashes and New Diabetes. Hematology Not Present- Easy Bruising, Excessive bleeding, Gland problems, HIV and Persistent Infections.   Vitals Weight: 206.6 lb Height: 72in Body Surface Area: 2.18 m Body Mass Index: 28.02 kg/m Temp.: 29F(Oral)  Pulse: 84 (Regular)  BP: 126/84 (  Sitting, Left Arm, Standard)    Physical Exam  General - appears comfortable, no distress; not diaphorectic  HEENT - normocephalic; sclerae clear, gaze conjugate; mucous membranes moist, dentition good; voice normal  Neck - symmetric on extension; no palpable anterior or posterior cervical adenopathy; no palpable masses in the thyroid bed  Chest - clear bilaterally without rhonchi, rales, or wheeze  Cor - regular rhythm with normal rate; no significant murmur  Abd - soft without distension; well-healed midline surgical incision; laparoscopy incisions well-healed. Palpation reveals no hepatosplenomegaly. There is mild tenderness to deep palpation in the right upper quadrant without palpable mass.  Ext - non-tender with moderate bilateral edema  Neuro - grossly intact; no tremor    Assessment & Plan  CHRONIC CHOLECYSTITIS WITH CALCULUS (574.10  K80.10)  Pt Education - Pamphlet Given - Laparoscopic Gallbladder Surgery: discussed with patient and provided information.  Patient presents on referral from gastroenterology for surgical management of symptomatic cholelithiasis. Patient and his daughter are presented with written literature to review at home.  Patient appears to have symptomatic cholelithiasis and probable chronic cholecystitis. Patient has persistent right upper quadrant abdominal pain which waxes and wanes. Ultrasound shows gallstones but no sign of acute cholecystitis.  I have recommended proceeding with laparoscopic cholecystectomy with intraoperative  cholangiography. We have discussed the risk and benefits of the procedure including the potential for conversion to open surgery, especially given his previous laparotomy for colonic resection. We discussed the hospital stay to be anticipated. We discussed his postoperative recovery. Patient and his daughter understand and wish to proceed.  We will obtain cardiac clearance from Dr. Adrian Prows prior to scheduling surgery.  The risks and benefits of the procedure have been discussed at length with the patient. The patient understands the proposed procedure, potential alternative treatments, and the course of recovery to be expected. All of the patient's questions have been answered at this time. The patient wishes to proceed with surgery.  Earnstine Regal, MD, [facility] Office: [phone]

## 2015-04-08 ENCOUNTER — Ambulatory Visit (HOSPITAL_COMMUNITY): Payer: Medicare Other | Admitting: Vascular Surgery

## 2015-04-08 ENCOUNTER — Inpatient Hospital Stay (HOSPITAL_COMMUNITY)
Admission: AD | Admit: 2015-04-08 | Discharge: 2015-04-10 | DRG: 415 | Disposition: A | Discharge status: Home / Self Care | Payer: Medicare Other | Source: Ambulatory Visit | Attending: Surgery | Admitting: Surgery

## 2015-04-08 ENCOUNTER — Ambulatory Visit (HOSPITAL_COMMUNITY): Payer: Medicare Other | Admitting: Anesthesiology

## 2015-04-08 ENCOUNTER — Encounter (HOSPITAL_COMMUNITY)
Admission: AD | Disposition: A | Discharge status: Home / Self Care | Payer: Self-pay | Source: Ambulatory Visit | Attending: Surgery

## 2015-04-08 ENCOUNTER — Encounter (HOSPITAL_COMMUNITY): Payer: Self-pay | Admitting: *Deleted

## 2015-04-08 DIAGNOSIS — Z905 Acquired absence of kidney: Secondary | ICD-10-CM | POA: Diagnosis present

## 2015-04-08 DIAGNOSIS — K660 Peritoneal adhesions (postprocedural) (postinfection): Secondary | ICD-10-CM | POA: Diagnosis present

## 2015-04-08 DIAGNOSIS — I2510 Atherosclerotic heart disease of native coronary artery without angina pectoris: Secondary | ICD-10-CM | POA: Diagnosis present

## 2015-04-08 DIAGNOSIS — I252 Old myocardial infarction: Secondary | ICD-10-CM

## 2015-04-08 DIAGNOSIS — Z8249 Family history of ischemic heart disease and other diseases of the circulatory system: Secondary | ICD-10-CM

## 2015-04-08 DIAGNOSIS — I5032 Chronic diastolic (congestive) heart failure: Secondary | ICD-10-CM | POA: Diagnosis present

## 2015-04-08 DIAGNOSIS — K5730 Diverticulosis of large intestine without perforation or abscess without bleeding: Secondary | ICD-10-CM | POA: Diagnosis present

## 2015-04-08 DIAGNOSIS — Q2733 Arteriovenous malformation of digestive system vessel: Secondary | ICD-10-CM

## 2015-04-08 DIAGNOSIS — E780 Pure hypercholesterolemia: Secondary | ICD-10-CM | POA: Diagnosis present

## 2015-04-08 DIAGNOSIS — I129 Hypertensive chronic kidney disease with stage 1 through stage 4 chronic kidney disease, or unspecified chronic kidney disease: Secondary | ICD-10-CM | POA: Diagnosis present

## 2015-04-08 DIAGNOSIS — Z951 Presence of aortocoronary bypass graft: Secondary | ICD-10-CM

## 2015-04-08 DIAGNOSIS — I2781 Cor pulmonale (chronic): Secondary | ICD-10-CM | POA: Diagnosis present

## 2015-04-08 DIAGNOSIS — R1011 Right upper quadrant pain: Secondary | ICD-10-CM | POA: Diagnosis present

## 2015-04-08 DIAGNOSIS — I482 Chronic atrial fibrillation: Secondary | ICD-10-CM | POA: Diagnosis present

## 2015-04-08 DIAGNOSIS — K8010 Calculus of gallbladder with chronic cholecystitis without obstruction: Secondary | ICD-10-CM | POA: Diagnosis present

## 2015-04-08 DIAGNOSIS — F17210 Nicotine dependence, cigarettes, uncomplicated: Secondary | ICD-10-CM | POA: Diagnosis present

## 2015-04-08 DIAGNOSIS — E1122 Type 2 diabetes mellitus with diabetic chronic kidney disease: Secondary | ICD-10-CM | POA: Diagnosis present

## 2015-04-08 DIAGNOSIS — J449 Chronic obstructive pulmonary disease, unspecified: Secondary | ICD-10-CM | POA: Diagnosis present

## 2015-04-08 DIAGNOSIS — E785 Hyperlipidemia, unspecified: Secondary | ICD-10-CM | POA: Diagnosis present

## 2015-04-08 DIAGNOSIS — N183 Chronic kidney disease, stage 3 (moderate): Secondary | ICD-10-CM | POA: Diagnosis present

## 2015-04-08 HISTORY — PX: LYSIS OF ADHESION: SHX5961

## 2015-04-08 HISTORY — PX: EXPLORATORY LAPAROTOMY: SUR591

## 2015-04-08 HISTORY — PX: LAPAROTOMY: SHX154

## 2015-04-08 HISTORY — PX: CHOLECYSTECTOMY: SHX55

## 2015-04-08 HISTORY — PX: LAPAROSCOPIC LYSIS OF ADHESIONS: SHX5905

## 2015-04-08 LAB — GLUCOSE, CAPILLARY
GLUCOSE-CAPILLARY: 144 mg/dL — AB (ref 65–99)
Glucose-Capillary: 147 mg/dL — ABNORMAL HIGH (ref 65–99)
Glucose-Capillary: 171 mg/dL — ABNORMAL HIGH (ref 65–99)
Glucose-Capillary: 177 mg/dL — ABNORMAL HIGH (ref 65–99)
Glucose-Capillary: 183 mg/dL — ABNORMAL HIGH (ref 65–99)

## 2015-04-08 LAB — CBC
HCT: 48.7 % (ref 39.0–52.0)
Hemoglobin: 16.9 g/dL (ref 13.0–17.0)
MCH: 31.2 pg (ref 26.0–34.0)
MCHC: 34.7 g/dL (ref 30.0–36.0)
MCV: 89.9 fL (ref 78.0–100.0)
Platelets: 126 10*3/uL — ABNORMAL LOW (ref 150–400)
RBC: 5.42 MIL/uL (ref 4.22–5.81)
RDW: 15.7 % — ABNORMAL HIGH (ref 11.5–15.5)
WBC: 11 10*3/uL — ABNORMAL HIGH (ref 4.0–10.5)

## 2015-04-08 LAB — CREATININE, SERUM
Creatinine, Ser: 1.2 mg/dL (ref 0.61–1.24)
GFR calc Af Amer: >60 mL/min (ref >60)
GFR calc non Af Amer: 57 mL/min — ABNORMAL LOW (ref >60)

## 2015-04-08 SURGERY — LAPAROTOMY, EXPLORATORY
Anesthesia: General | Site: Abdomen

## 2015-04-08 MED ORDER — ENOXAPARIN SODIUM 40 MG/0.4ML ~~LOC~~ SOLN
40.0000 mg | SUBCUTANEOUS | Status: DC
  Administered 2015-04-09 – 2015-04-10 (×2): 40 mg via SUBCUTANEOUS
  Filled 2015-04-08 (×3): qty 0.4

## 2015-04-08 MED ORDER — SODIUM CHLORIDE 0.9 % IV SOLN
1.0000 g | INTRAVENOUS | Status: DC
  Administered 2015-04-09 – 2015-04-10 (×2): 1 g via INTRAVENOUS
  Filled 2015-04-08 (×3): qty 1

## 2015-04-08 MED ORDER — LIDOCAINE HCL (CARDIAC) 20 MG/ML IV SOLN
INTRAVENOUS | Status: AC
  Filled 2015-04-08: qty 5

## 2015-04-08 MED ORDER — LACTATED RINGERS IV SOLN
INTRAVENOUS | Status: DC | PRN
  Administered 2015-04-08 (×2): via INTRAVENOUS

## 2015-04-08 MED ORDER — MIDAZOLAM HCL 5 MG/5ML IJ SOLN
INTRAMUSCULAR | Status: DC | PRN
  Administered 2015-04-08: 2 mg via INTRAVENOUS

## 2015-04-08 MED ORDER — LIDOCAINE HCL (CARDIAC) 20 MG/ML IV SOLN
INTRAVENOUS | Status: DC | PRN
  Administered 2015-04-08: 80 mg via INTRAVENOUS

## 2015-04-08 MED ORDER — BUDESONIDE 0.25 MG/2ML IN SUSP
0.2500 mg | Freq: Two times a day (BID) | RESPIRATORY_TRACT | Status: DC
  Administered 2015-04-08 – 2015-04-10 (×4): 0.25 mg via RESPIRATORY_TRACT
  Filled 2015-04-08 (×8): qty 2

## 2015-04-08 MED ORDER — AZELASTINE HCL 0.1 % NA SOLN
1.0000 | Freq: Every day | NASAL | Status: DC | PRN
  Filled 2015-04-08: qty 30

## 2015-04-08 MED ORDER — SODIUM CHLORIDE 0.9 % IV SOLN
INTRAVENOUS | Status: DC | PRN
  Administered 2015-04-08: 09:00:00

## 2015-04-08 MED ORDER — BUPIVACAINE-EPINEPHRINE 0.25% -1:200000 IJ SOLN
INTRAMUSCULAR | Status: DC | PRN
Start: 2015-04-08 — End: 2015-04-08

## 2015-04-08 MED ORDER — BUPIVACAINE-EPINEPHRINE (PF) 0.25% -1:200000 IJ SOLN
INTRAMUSCULAR | Status: AC
  Filled 2015-04-08: qty 30

## 2015-04-08 MED ORDER — CEFAZOLIN SODIUM-DEXTROSE 2-3 GM-% IV SOLR
2.0000 g | INTRAVENOUS | Status: AC
  Administered 2015-04-08: 2 g via INTRAVENOUS

## 2015-04-08 MED ORDER — BUPIVACAINE HCL (PF) 0.25 % IJ SOLN
INTRAMUSCULAR | Status: AC
  Filled 2015-04-08: qty 30

## 2015-04-08 MED ORDER — 0.9 % SODIUM CHLORIDE (POUR BTL) OPTIME
TOPICAL | Status: DC | PRN
  Administered 2015-04-08 (×3): 1000 mL

## 2015-04-08 MED ORDER — ACETAMINOPHEN 325 MG PO TABS: 650.0000 mg | ORAL_TABLET | ORAL | Status: DC | PRN

## 2015-04-08 MED ORDER — LISINOPRIL 5 MG PO TABS
2.5000 mg | ORAL_TABLET | Freq: Every day | ORAL | Status: DC
  Administered 2015-04-09 – 2015-04-10 (×2): 2.5 mg via ORAL
  Filled 2015-04-08 (×2): qty 1

## 2015-04-08 MED ORDER — ONDANSETRON HCL 4 MG PO TABS: 4.0000 mg | ORAL_TABLET | Freq: Four times a day (QID) | ORAL | Status: DC | PRN

## 2015-04-08 MED ORDER — CEFAZOLIN SODIUM-DEXTROSE 2-3 GM-% IV SOLR
INTRAVENOUS | Status: AC
  Filled 2015-04-08: qty 50

## 2015-04-08 MED ORDER — KCL IN DEXTROSE-NACL 20-5-0.45 MEQ/L-%-% IV SOLN
INTRAVENOUS | Status: DC
  Administered 2015-04-08 – 2015-04-09 (×3): via INTRAVENOUS
  Filled 2015-04-08 (×3): qty 1000

## 2015-04-08 MED ORDER — ONDANSETRON HCL 4 MG/2ML IJ SOLN: 4.0000 mg | Freq: Four times a day (QID) | INTRAMUSCULAR | Status: DC | PRN

## 2015-04-08 MED ORDER — ALBUTEROL SULFATE (2.5 MG/3ML) 0.083% IN NEBU: 2.5000 mg | INHALATION_SOLUTION | RESPIRATORY_TRACT | Status: DC | PRN

## 2015-04-08 MED ORDER — FENTANYL CITRATE (PF) 100 MCG/2ML IJ SOLN: INTRAMUSCULAR | Status: DC | PRN

## 2015-04-08 MED ORDER — ROCURONIUM BROMIDE 50 MG/5ML IV SOLN
INTRAVENOUS | Status: AC
  Filled 2015-04-08: qty 1

## 2015-04-08 MED ORDER — HYDROCODONE-ACETAMINOPHEN 5-325 MG PO TABS
1.0000 | ORAL_TABLET | ORAL | Status: DC | PRN
  Administered 2015-04-08 – 2015-04-10 (×7): 2 via ORAL
  Filled 2015-04-08 (×7): qty 2

## 2015-04-08 MED ORDER — PROPRANOLOL HCL 1 MG/ML IV SOLN: INTRAVENOUS | Status: DC | PRN

## 2015-04-08 MED ORDER — PROPOFOL 10 MG/ML IV BOLUS
INTRAVENOUS | Status: DC | PRN
  Administered 2015-04-08: 200 mg via INTRAVENOUS

## 2015-04-08 MED ORDER — DIGOXIN 125 MCG PO TABS
0.1250 mg | ORAL_TABLET | Freq: Every day | ORAL | Status: DC
  Administered 2015-04-09 – 2015-04-10 (×2): 0.125 mg via ORAL
  Filled 2015-04-08 (×2): qty 1

## 2015-04-08 MED ORDER — SPIRONOLACTONE 50 MG PO TABS
50.0000 mg | ORAL_TABLET | Freq: Every day | ORAL | Status: DC
  Administered 2015-04-08 – 2015-04-10 (×3): 50 mg via ORAL
  Filled 2015-04-08 (×3): qty 1

## 2015-04-08 MED ORDER — ROCURONIUM BROMIDE 100 MG/10ML IV SOLN
INTRAVENOUS | Status: DC | PRN
  Administered 2015-04-08: 30 mg via INTRAVENOUS
  Administered 2015-04-08: 20 mg via INTRAVENOUS

## 2015-04-08 MED ORDER — INSULIN ASPART 100 UNIT/ML ~~LOC~~ SOLN
0.0000 [IU] | Freq: Three times a day (TID) | SUBCUTANEOUS | Status: DC
Start: 2015-04-08 — End: 2015-04-10
  Administered 2015-04-08 (×2): 3 [IU] via SUBCUTANEOUS
  Administered 2015-04-09: 2 [IU] via SUBCUTANEOUS
  Administered 2015-04-09 – 2015-04-10 (×4): 3 [IU] via SUBCUTANEOUS

## 2015-04-08 MED ORDER — GLYCOPYRROLATE 0.2 MG/ML IJ SOLN
INTRAMUSCULAR | Status: DC | PRN
  Administered 2015-04-08: 0.4 mg via INTRAVENOUS

## 2015-04-08 MED ORDER — FENTANYL CITRATE (PF) 250 MCG/5ML IJ SOLN
INTRAMUSCULAR | Status: AC
  Filled 2015-04-08: qty 5

## 2015-04-08 MED ORDER — PROPRANOLOL HCL 1 MG/ML IV SOLN
INTRAVENOUS | Status: DC | PRN
  Administered 2015-04-08 (×2): 1 mg via INTRAVENOUS

## 2015-04-08 MED ORDER — ONDANSETRON HCL 4 MG/2ML IJ SOLN
INTRAMUSCULAR | Status: DC | PRN
  Administered 2015-04-08: 4 mg via INTRAVENOUS

## 2015-04-08 MED ORDER — FENTANYL CITRATE (PF) 100 MCG/2ML IJ SOLN
INTRAMUSCULAR | Status: DC | PRN
  Administered 2015-04-08: 25 ug via INTRAVENOUS
  Administered 2015-04-08: 100 ug via INTRAVENOUS
  Administered 2015-04-08: 125 ug via INTRAVENOUS

## 2015-04-08 MED ORDER — PHENYLEPHRINE HCL 10 MG/ML IJ SOLN
INTRAMUSCULAR | Status: DC | PRN
  Administered 2015-04-08 (×5): 80 ug via INTRAVENOUS

## 2015-04-08 MED ORDER — METOPROLOL TARTRATE 50 MG PO TABS
50.0000 mg | ORAL_TABLET | Freq: Two times a day (BID) | ORAL | Status: DC
  Administered 2015-04-08 – 2015-04-10 (×4): 50 mg via ORAL
  Filled 2015-04-08 (×4): qty 1

## 2015-04-08 MED ORDER — SODIUM CHLORIDE 0.9 % IR SOLN
Status: DC | PRN
  Administered 2015-04-08: 1000 mL

## 2015-04-08 MED ORDER — SODIUM CHLORIDE 0.9 % IV SOLN
1.0000 g | INTRAVENOUS | Status: AC
  Administered 2015-04-08: 1 g via INTRAVENOUS
  Filled 2015-04-08: qty 1

## 2015-04-08 MED ORDER — HYDROMORPHONE HCL 1 MG/ML IJ SOLN
1.0000 mg | INTRAMUSCULAR | Status: DC | PRN
  Administered 2015-04-08 – 2015-04-09 (×4): 1 mg via INTRAVENOUS
  Filled 2015-04-08 (×4): qty 1

## 2015-04-08 MED ORDER — PROPOFOL 10 MG/ML IV BOLUS
INTRAVENOUS | Status: AC
  Filled 2015-04-08: qty 20

## 2015-04-08 MED ORDER — HYPROMELLOSE (GONIOSCOPIC) 2.5 % OP SOLN
Freq: Two times a day (BID) | OPHTHALMIC | Status: DC | PRN
  Filled 2015-04-08: qty 15

## 2015-04-08 MED ORDER — MIDAZOLAM HCL 2 MG/2ML IJ SOLN
INTRAMUSCULAR | Status: AC
  Filled 2015-04-08: qty 2

## 2015-04-08 MED ORDER — NEOSTIGMINE METHYLSULFATE 10 MG/10ML IV SOLN
INTRAVENOUS | Status: DC | PRN
  Administered 2015-04-08: 3 mg via INTRAVENOUS

## 2015-04-08 SURGICAL SUPPLY — 62 items
APPLIER CLIP ROT 10 11.4 M/L (STAPLE) ×4
BENZOIN TINCTURE PRP APPL 2/3 (GAUZE/BANDAGES/DRESSINGS) ×4 IMPLANT
BLADE SURG CLIPPER 3M 9600 (MISCELLANEOUS) IMPLANT
CANISTER SUCTION 2500CC (MISCELLANEOUS) ×4 IMPLANT
CHLORAPREP W/TINT 26ML (MISCELLANEOUS) ×4 IMPLANT
CLIP APPLIE ROT 10 11.4 M/L (STAPLE) ×3 IMPLANT
COVER MAYO STAND STRL (DRAPES) ×4 IMPLANT
COVER SURGICAL LIGHT HANDLE (MISCELLANEOUS) ×4 IMPLANT
DRAPE C-ARM 42X72 X-RAY (DRAPES) ×4 IMPLANT
DRAPE PROXIMA HALF (DRAPES) ×4 IMPLANT
ELECT BLADE 4.0 EZ CLEAN MEGAD (MISCELLANEOUS) ×4
ELECT REM PT RETURN 9FT ADLT (ELECTROSURGICAL) ×4
ELECTRODE BLDE 4.0 EZ CLN MEGD (MISCELLANEOUS) ×3 IMPLANT
ELECTRODE REM PT RTRN 9FT ADLT (ELECTROSURGICAL) ×3 IMPLANT
GAUZE SPONGE 2X2 8PLY STRL LF (GAUZE/BANDAGES/DRESSINGS) ×3 IMPLANT
GLOVE BIO SURGEON STRL SZ 6.5 (GLOVE) ×4 IMPLANT
GLOVE BIO SURGEON STRL SZ7.5 (GLOVE) ×4 IMPLANT
GLOVE BIOGEL PI IND STRL 6.5 (GLOVE) ×6 IMPLANT
GLOVE BIOGEL PI IND STRL 7.0 (GLOVE) ×6 IMPLANT
GLOVE BIOGEL PI IND STRL 7.5 (GLOVE) ×3 IMPLANT
GLOVE BIOGEL PI INDICATOR 6.5 (GLOVE) ×2
GLOVE BIOGEL PI INDICATOR 7.0 (GLOVE) ×2
GLOVE BIOGEL PI INDICATOR 7.5 (GLOVE) ×1
GLOVE SURG ORTHO 8.0 STRL STRW (GLOVE) ×8 IMPLANT
GLOVE SURG SS PI 6.5 STRL IVOR (GLOVE) ×4 IMPLANT
GOWN STRL REUS W/ TWL LRG LVL3 (GOWN DISPOSABLE) ×6 IMPLANT
GOWN STRL REUS W/ TWL XL LVL3 (GOWN DISPOSABLE) ×3 IMPLANT
GOWN STRL REUS W/TWL LRG LVL3 (GOWN DISPOSABLE) ×2
GOWN STRL REUS W/TWL XL LVL3 (GOWN DISPOSABLE) ×1
KIT BASIN OR (CUSTOM PROCEDURE TRAY) ×4 IMPLANT
KIT ROOM TURNOVER OR (KITS) ×4 IMPLANT
NS IRRIG 1000ML POUR BTL (IV SOLUTION) ×12 IMPLANT
PAD ARMBOARD 7.5X6 YLW CONV (MISCELLANEOUS) ×4 IMPLANT
PENCIL BUTTON HOLSTER BLD 10FT (ELECTRODE) ×4 IMPLANT
POUCH SPECIMEN RETRIEVAL 10MM (ENDOMECHANICALS) IMPLANT
SCISSORS LAP 5X35 DISP (ENDOMECHANICALS) ×4 IMPLANT
SET CHOLANGIOGRAPH 5 50 .035 (SET/KITS/TRAYS/PACK) ×4 IMPLANT
SET IRRIG TUBING LAPAROSCOPIC (IRRIGATION / IRRIGATOR) ×4 IMPLANT
SLEEVE ENDOPATH XCEL 5M (ENDOMECHANICALS) ×4 IMPLANT
SPECIMEN JAR SMALL (MISCELLANEOUS) ×4 IMPLANT
SPONGE GAUZE 2X2 STER 10/PKG (GAUZE/BANDAGES/DRESSINGS) ×1
SPONGE GAUZE 4X4 12PLY STER LF (GAUZE/BANDAGES/DRESSINGS) ×4 IMPLANT
SPONGE LAP 18X18 X RAY DECT (DISPOSABLE) ×8 IMPLANT
STAPLER VISISTAT 35W (STAPLE) ×4 IMPLANT
SUCTION POOLE TIP (SUCTIONS) ×4 IMPLANT
SUT MNCRL AB 4-0 PS2 18 (SUTURE) ×4 IMPLANT
SUT NOVA 1 T20/GS 25DT (SUTURE) ×12 IMPLANT
SUT SILK 2 0 SH CR/8 (SUTURE) ×4 IMPLANT
SUT SILK 2 0 TIES 10X30 (SUTURE) ×4 IMPLANT
SUT SILK 3 0 SH CR/8 (SUTURE) ×4 IMPLANT
SUT SILK 3 0 TIES 10X30 (SUTURE) ×4 IMPLANT
SUT VIC AB 3-0 SH 18 (SUTURE) ×4 IMPLANT
TAPE CLOTH SURG 4X10 WHT LF (GAUZE/BANDAGES/DRESSINGS) ×4 IMPLANT
TOWEL OR 17X24 6PK STRL BLUE (TOWEL DISPOSABLE) ×4 IMPLANT
TOWEL OR 17X26 10 PK STRL BLUE (TOWEL DISPOSABLE) ×4 IMPLANT
TRAY LAPAROSCOPIC MC (CUSTOM PROCEDURE TRAY) ×4 IMPLANT
TROCAR XCEL BLUNT TIP 100MML (ENDOMECHANICALS) ×4 IMPLANT
TROCAR XCEL NON-BLD 11X100MML (ENDOMECHANICALS) ×4 IMPLANT
TROCAR XCEL NON-BLD 5MMX100MML (ENDOMECHANICALS) ×4 IMPLANT
TUBE CONNECTING 12X1/4 (SUCTIONS) ×4 IMPLANT
TUBING INSUFFLATION (TUBING) ×4 IMPLANT
YANKAUER SUCT BULB TIP NO VENT (SUCTIONS) ×8 IMPLANT

## 2015-04-08 NOTE — Brief Op Note (Signed)
04/08/2015  9:39 AM  PATIENT:  [name]  74 y.o. male  PRE-OPERATIVE DIAGNOSIS:  CHRONIC CHOLECYSTITIS, CHOLELITHIASIS  POST-OPERATIVE DIAGNOSIS:  CHRONIC CHOLECYSTITIS, CHOLELITHIASIS  PROCEDURE:  Procedure(s): EXPLORATORY LAPAROTOMY, REPAIR OF ENTEROTOMY (N/A) LYSIS OF ADHESION CHOLECYSTECTOMY  SURGEON:  Surgeon(s) and Role:    * Armandina Gemma, MD - Primary  ASSISTANTS: Sharyn Dross, RNFA   ANESTHESIA:   general  EBL:  Total I/O In: 1000 [I.V.:1000] Out: -   BLOOD ADMINISTERED:none  DRAINS: none   LOCAL MEDICATIONS USED:  NONE  SPECIMEN:  Excision  DISPOSITION OF SPECIMEN:  PATHOLOGY  COUNTS:  YES  TOURNIQUET:  * No tourniquets in log *  DICTATION: .Other Dictation: Dictation Number [id number]  PLAN OF CARE: Admit to inpatient   PATIENT DISPOSITION:  PACU - hemodynamically stable.   Delay start of Pharmacological VTE agent (>24hrs) due to surgical blood loss or risk of bleeding: yes  Earnstine Regal, MD, [facility] Office: [phone]

## 2015-04-08 NOTE — Progress Notes (Signed)
ekg report called to dr Tamala Julian..cardiology ordered and requested

## 2015-04-08 NOTE — Progress Notes (Signed)
Dr gerkin at bedside has spoken with bridgett dr Francesca Jewett pa regarding new onset of af  She will come see pt in pacu

## 2015-04-08 NOTE — Consult Note (Signed)
CARDIOLOGY CONSULT NOTE  Patient ID: [name] MRN: [id number] DOB/AGE: 1941/09/08 74 y.o.  Admit date: 04/08/2015 Referring Physician  Armandina Gemma, MD Primary Physician:  Donnie Coffin, MD Reason for Consultation  A.fib with RVR  HPI: [name] is a 74 y.o. male with a history of HTN, hyperlipidemia, diabetes with CKD stage III, permanent atrial fibrillation, and known coronary artery disease and myocardial infarction, s/p CABG status. He was admitted to the hospital today for elective laparoscopic cholecystectomy which ultimately required open repair of enterotomy.  During surgery and recovery, pt developed paroxysmal episodes of a.fib with RVR. Pt is in chronic a.fib rate controlled with metoprolol and digoxin.  Pt's family at the bedside, reports compliance with medications and denies any new cardiac symptoms prior to admission for surgery.  Pt has just been given pain medication and is sleeping presently.    Past Medical History  Diagnosis Date  . CAD (coronary artery disease)   . Acute non-ST segment elevation myocardial infarction   . Left renal mass   . Diverticulitis   . HTN (hypertension)   . Myocardial infarction 06/19/2011  . Ulcer 1960's    "stomach"  . Blood transfusion     "related to surgeries"  . Diverticulitis 1996  . CHF (congestive heart failure)   . High cholesterol   . Arthritis     "hands" (09/16/2014)  . COPD (chronic obstructive pulmonary disease)   . Type II diabetes mellitus     type II  . Renal neoplasm     LEFT  . Dysrhythmia     A. Fib  . Cancer     kidney     Past Surgical History  Procedure Laterality Date  . Coronary artery bypass graft  06/2011    4 VESSELS  . Colon resection  2005    FOR DIVERTICULITIS  . Robot assisted laparoscopic nephrectomy  11/29/2011    Procedure: ROBOTIC ASSISTED LAPAROSCOPIC NEPHRECTOMY;  Surgeon: Dutch Gray, MD;  Location: [facility];  Service: Urology;  Laterality: Left;  PARTIAL left nephrectomy    .  Cardioversion  01/11/2012    Procedure: CARDIOVERSION;  Surgeon: Laverda Page, MD;  Location: [facility];  Service: Cardiovascular;  Laterality: N/A;  . Colonoscopy  02/02/2012    Procedure: COLONOSCOPY;  Surgeon: Jeryl Columbia, MD;  Location: [facility];  Service: Endoscopy;  Laterality: N/A;  magod /ja  . Cholecystectomy    . Hernia repair Bilateral X 4    "twice on each side"  . Colon surgery    . Cataract extraction w/ intraocular lens  implant, bilateral Bilateral   . Appendectomy       Family History  Problem Relation Age of Onset  . Heart disease Father   . Alzheimer's disease Mother   . Alcohol abuse Brother      Social History: History   Social History  . Marital Status: Married    Spouse Name: N/A  . Number of Children: 3  . Years of Education: N/A   Occupational History  .     Social History Main Topics  . Smoking status: Current Every Day Smoker -- 0.50 packs/day for 52 years    Types: Cigarettes  . Smokeless tobacco: Never Used  . Alcohol Use: No  . Drug Use: No  . Sexual Activity: Not Currently   Other Topics Concern  . Not on file   Social History Narrative     Prescriptions prior to admission  Medication Sig Dispense Refill  Last Dose  . albuterol (PROVENTIL HFA;VENTOLIN HFA) 108 (90 BASE) MCG/ACT inhaler Inhale 1-2 puffs into the lungs every 4 (four) hours as needed for wheezing or shortness of breath.   04/08/2015 at Unknown time  . aspirin EC 81 MG tablet Take 81 mg by mouth 2 (two) times daily.   04/07/2015 at Unknown time  . azelastine (ASTELIN) 0.1 % nasal spray Place 1 spray into both nostrils daily as needed for allergies.   0 04/07/2015 at Unknown time  . Carboxymethylcell-Hypromellose (GENTEAL OP) Place 1 drop into both eyes 2 (two) times daily as needed (dry eye).   04/07/2015 at Unknown time  . digoxin (LANOXIN) 0.125 MG tablet Take 0.125 mg by mouth daily.   04/08/2015 at Unknown time  . fenofibrate (TRICOR) 145 MG tablet Take 145 mg by mouth  daily after breakfast.    04/07/2015 at Unknown time  . fluticasone (FLOVENT HFA) 110 MCG/ACT inhaler Inhale 1 puff into the lungs 2 (two) times daily. 1 Inhaler 3 04/07/2015 at Unknown time  . ketoconazole (NIZORAL) 2 % cream Apply 1 application topically daily. Apply to rash on feet   03/10/2015  . lisinopril (PRINIVIL,ZESTRIL) 2.5 MG tablet Take 2.5 mg by mouth daily after breakfast.    04/07/2015 at Unknown time  . metFORMIN (GLUCOPHAGE) 1000 MG tablet Take 1,000 mg by mouth 2 (two) times daily with a meal.   04/07/2015 at Unknown time  . metoprolol (LOPRESSOR) 50 MG tablet Take 50 mg by mouth 2 (two) times daily.    04/08/2015 at 0445  . nitroGLYCERIN (NITROSTAT) 0.4 MG SL tablet Place 0.4 mg under the tongue every 5 (five) minutes x 3 doses as needed for chest pain. For chest pain   Past Month at Unknown time  . pravastatin (PRAVACHOL) 40 MG tablet Take 40 mg by mouth at bedtime.    04/07/2015 at Unknown time  . spironolactone (ALDACTONE) 50 MG tablet Take 50 mg by mouth daily.   04/07/2015 at Unknown time  . traMADol (ULTRAM) 50 MG tablet Take 50 mg by mouth every 8 (eight) hours as needed for severe pain.    04/07/2015 at Unknown time     ROS: Unable to perform, patient sedated.    Physical Exam: Blood pressure 163/93, pulse 98, temperature 98.4 F (36.9 C), temperature source Oral, resp. rate 16, height 6' (1.829 m), weight 92.534 kg (204 lb), SpO2 93 %.   General appearance: sedated Lungs: clear to auscultation bilaterally Chest wall: no tenderness Heart: irregularly irregular rhythm, S1: variable, S2: normal, no S3 or S4 and no murmur Extremities: extremities normal, atraumatic, no cyanosis or edema Pulses: LE peripheral pulses faint bilaterally Skin: Skin color, texture, turgor normal. No rashes or lesions  Labs:   Lab Results  Component Value Date   WBC 6.5 04/03/2015   HGB 16.6 04/03/2015   HCT 48.4 04/03/2015   MCV 90.3 04/03/2015   PLT 142* 04/03/2015    Recent Labs Lab  04/03/15 1548  NA 134*  K 4.8  CL 103  CO2 19*  BUN 19  CREATININE 1.29*  CALCIUM 9.0  GLUCOSE 141*    Lipid Panel     Component Value Date/Time   CHOL 132 06/20/2011 0530   TRIG 147 06/20/2011 0530   HDL 27* 06/20/2011 0530   CHOLHDL 4.9 06/20/2011 0530   VLDL 29 06/20/2011 0530   LDLCALC 76 06/20/2011 0530    BNP (last 3 results)  Recent Labs  09/18/14 0500 09/19/14 0438 03/12/15 2349  BNP 88.4 75.9 239.1*    ProBNP (last 3 results)  Recent Labs  08/29/14 1803 08/29/14 2140  PROBNP 1356.0* 1294.0*    HEMOGLOBIN A1C Lab Results  Component Value Date   HGBA1C 7.3* 04/03/2015   MPG 163 04/03/2015    Cardiac Panel (last 3 results)  Recent Labs  08/29/14 1539 03/12/15 2349  CKTOTAL 111  --   TROPONINI  --  0.03    Lab Results  Component Value Date   CKTOTAL 111 08/29/2014   CKMB 3.7 10/27/2011   TROPONINI 0.03 03/12/2015     TSH  Recent Labs  08/29/14 1803 08/29/14 2140 09/16/14 1250  TSH 1.690 0.993 1.466    EKG 04/08/2015 at 0949: A.fib with controlled ventricular response at a rate of 84 bpm, rightward axis, RBBB, LPFB. Diffuse, non-specific ST abnormality.  Cannot exclude inferior infarct old.  No significant change from prior EKGs.  Telemetry monitor 04/08/2015 at 1330: A. Fib with accelerated ventricular response at a rate of 100-110.  Outpatient Echocardiogram 08/30/2014: Left ventricle: The cavity size was normal. Systolic function was mildly to moderately reduced. The estimated ejection fraction was in the range of 40% to 45%. Akinesis of the inferior myocardium. The study is not technically sufficient to allow evaluation of LV diastolic function. Ventricular septum: Septal motion showed paradox. Aortic valve: There was trivial regurgitation. Left atrium: The atrium was moderately to severely dilated. Right atrium: The atrium was mildly dilated.  Atrial septum: No defect or patent foramen ovale was identified.   Radiology: No  results found.  Scheduled Meds: . budesonide  0.25 mg Nebulization BID  . ceFAZolin      . [START ON 04/09/2015] digoxin  0.125 mg Oral Daily  . [START ON 04/09/2015] enoxaparin (LOVENOX) injection  40 mg Subcutaneous Q24H  . [START ON 04/09/2015] ertapenem (INVANZ) IV  1 g Intravenous Q24H  . insulin aspart  0-15 Units Subcutaneous TID WC  . [START ON 04/09/2015] lisinopril  2.5 mg Oral QPC breakfast  . metoprolol  50 mg Oral BID  . spironolactone  50 mg Oral Daily   Continuous Infusions: . dextrose 5 % and 0.45 % NaCl with KCl 20 mEq/L 75 mL/hr at 04/08/15 1323   PRN Meds:.acetaminophen, albuterol, azelastine, HYDROcodone-acetaminophen, HYDROmorphone (DILAUDID) injection, hydroxypropyl methylcellulose / hypromellose, ondansetron **OR** ondansetron (ZOFRAN) IV  ASSESSMENT AND PLAN:  1. 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 Patient's choice. 2. Chronic diastolic heart failure 3. CAD without angina 4. Chronic cor pulmonale and COPD 5. DM with CKD stage II  Recommendation: Patient with paroxysmal episodes A. fib with RVR.  Patient has chronic A. fib rate controlled by digoxin and beta blockers.  Episodes of rapid ventricular response likely due to response to surgery.  Rate currently 100-110.  He is only a few hours postop and had just been given pain medicine due to severe pain.  Patient's family at the bedside and reports good compliance with medications prior to admission to hospital. No changes in medications made currently.  Will continue to follow.  Rachel Bo, NP-C 04/08/2015, 2:01 PM [facility]. [place] Pager: [phone] Office: [phone]

## 2015-04-08 NOTE — Progress Notes (Signed)
Cardiac PA came in to check on patient, made aware of the heart rate >140, PA stated patient is a chronic Afib and will not do anything at this time. Will continue to monitor.

## 2015-04-08 NOTE — Anesthesia Procedure Notes (Signed)
Procedure Name: Intubation Date/Time: 04/08/2015 7:28 AM Performed by: Manus Gunning, Kerstin Crusoe J Pre-anesthesia Checklist: Patient identified, Timeout performed, Emergency Drugs available, Suction available and Patient being monitored Patient Re-evaluated:Patient Re-evaluated prior to inductionOxygen Delivery Method: Circle system utilized Preoxygenation: Pre-oxygenation with 100% oxygen Intubation Type: IV induction Ventilation: Mask ventilation without difficulty Laryngoscope Size: Mac and 4 Grade View: Grade II Tube type: Oral Tube size: 7.5 mm Number of attempts: 1 Placement Confirmation: ETT inserted through vocal cords under direct vision,  breath sounds checked- equal and bilateral and positive ETCO2 Secured at: 1 cm Tube secured with: Tape Dental Injury: Teeth and Oropharynx as per pre-operative assessment

## 2015-04-08 NOTE — Anesthesia Postprocedure Evaluation (Signed)
  Anesthesia Post-op Note  Patient: [name]  Procedure(s) Performed: Procedure(s): EXPLORATORY LAPAROTOMY, REPAIR OF ENTEROTOMY (N/A) LYSIS OF ADHESION CHOLECYSTECTOMY  Patient Location: PACU  Anesthesia Type:General  Level of Consciousness: awake, oriented, sedated and patient cooperative  Airway and Oxygen Therapy: Patient Spontanous Breathing  Post-op Pain: mild  Post-op Assessment: Post-op Vital signs reviewed, Patient's Cardiovascular Status Stable, Respiratory Function Stable, Patent Airway, No signs of Nausea or vomiting and Pain level controlled              Post-op Vital Signs: stable  Last Vitals:  Filed Vitals:   04/08/15 1100  BP:   Pulse: 94  Temp: 36.6 C  Resp:     Complications: No apparent anesthesia complications

## 2015-04-08 NOTE — Progress Notes (Signed)
ekg done per order dr Tamala Julian

## 2015-04-08 NOTE — Interval H&P Note (Signed)
History and Physical Interval Note:  04/08/2015 7:14 AM  [name]  has presented today for surgery, with the diagnosis of CHRONIC CHOLECYSTITIS, CHOLELITHIASIS.  The various methods of treatment have been discussed with the patient and family. After consideration of risks, benefits and other options for treatment, the patient has consented to    Procedure(s): LAPAROSCOPIC CHOLECYSTECTOMY WITH INTRAOPERATIVE CHOLANGIOGRAM (N/A) as a surgical intervention .    The patient's history has been reviewed, patient examined, no change in status, stable for surgery.  I have reviewed the patient's chart and labs.  Questions were answered to the patient's satisfaction.    Earnstine Regal, MD, [facility] Office: [place]

## 2015-04-08 NOTE — Transfer of Care (Signed)
Immediate Anesthesia Transfer of Care Note  Patient: [name]  Procedure(s) Performed: Procedure(s): EXPLORATORY LAPAROTOMY, REPAIR OF ENTEROTOMY (N/A) LYSIS OF ADHESION CHOLECYSTECTOMY  Patient Location: PACU  Anesthesia Type:General  Level of Consciousness: awake  Airway & Oxygen Therapy: Patient Spontanous Breathing and Patient connected to face mask oxygen  Post-op Assessment: Report given to RN and Post -op Vital signs reviewed and stable  Post vital signs: Reviewed and stable  Last Vitals:  Filed Vitals:   04/08/15 0637  BP: 122/91  Pulse: 63  Temp: 36.4 C  Resp: 20    Complications: No apparent anesthesia complications

## 2015-04-08 NOTE — Anesthesia Preprocedure Evaluation (Signed)
Anesthesia Evaluation  Patient identified by MRN, date of birth, ID band Patient awake    Reviewed: Allergy & Precautions, NPO status , Patient's Chart, lab work & pertinent test results  Airway Mallampati: I       Dental   Pulmonary COPDCurrent Smoker,    Pulmonary exam normal       Cardiovascular hypertension, + CAD, + Past MI, + CABG and +CHF Normal cardiovascular exam+ dysrhythmias     Neuro/Psych    GI/Hepatic   Endo/Other  diabetes, Type 2, Oral Hypoglycemic Agents  Renal/GU Renal InsufficiencyRenal disease     Musculoskeletal  (+) Arthritis -,   Abdominal   Peds  Hematology   Anesthesia Other Findings   Reproductive/Obstetrics                             Anesthesia Physical Anesthesia Plan  ASA: III  Anesthesia Plan: General   Post-op Pain Management:    Induction: Intravenous  Airway Management Planned: Oral ETT  Additional Equipment:   Intra-op Plan:   Post-operative Plan: Extubation in OR  Informed Consent: I have reviewed the patients History and Physical, chart, labs and discussed the procedure including the risks, benefits and alternatives for the proposed anesthesia with the patient or authorized representative who has indicated his/her understanding and acceptance.     Plan Discussed with: CRNA, Anesthesiologist and Surgeon  Anesthesia Plan Comments:         Anesthesia Quick Evaluation

## 2015-04-09 ENCOUNTER — Encounter (HOSPITAL_COMMUNITY): Payer: Self-pay | Admitting: Surgery

## 2015-04-09 LAB — BASIC METABOLIC PANEL
ANION GAP: 7 (ref 5–15)
BUN: 14 mg/dL (ref 6–20)
CALCIUM: 8.9 mg/dL (ref 8.9–10.3)
CO2: 24 mmol/L (ref 22–32)
Chloride: 102 mmol/L (ref 101–111)
Creatinine, Ser: 1.14 mg/dL (ref 0.61–1.24)
GFR calc non Af Amer: >60 mL/min (ref >60)
Glucose, Bld: 142 mg/dL — ABNORMAL HIGH (ref 65–99)
Potassium: 4.5 mmol/L (ref 3.5–5.1)
SODIUM: 133 mmol/L — AB (ref 135–145)

## 2015-04-09 LAB — CBC
HEMATOCRIT: 48.9 % (ref 39.0–52.0)
Hemoglobin: 16.5 g/dL (ref 13.0–17.0)
MCH: 30.7 pg (ref 26.0–34.0)
MCHC: 33.7 g/dL (ref 30.0–36.0)
MCV: 91.1 fL (ref 78.0–100.0)
PLATELETS: 133 10*3/uL — AB (ref 150–400)
RBC: 5.37 MIL/uL (ref 4.22–5.81)
RDW: 15.9 % — ABNORMAL HIGH (ref 11.5–15.5)
WBC: 9.1 10*3/uL (ref 4.0–10.5)

## 2015-04-09 LAB — GLUCOSE, CAPILLARY
GLUCOSE-CAPILLARY: 151 mg/dL — AB (ref 65–99)
Glucose-Capillary: 147 mg/dL — ABNORMAL HIGH (ref 65–99)
Glucose-Capillary: 173 mg/dL — ABNORMAL HIGH (ref 65–99)
Glucose-Capillary: 167 mg/dL — ABNORMAL HIGH (ref 65–99)

## 2015-04-09 NOTE — Progress Notes (Signed)
Subjective:  Pt much more alert today, reports being in a significant amount of pain. Denies chest pain, SOB, heart racing.  Has been walking around room and taking PO clear fluids.  Heart rate 80s-100s.  Daughter at bedside.    Objective:  Vital Signs in the last 24 hours: Temp:  [97.5 F (36.4 C)-99.2 F (37.3 C)] 98.1 F (36.7 C) (07/27 0610) Pulse Rate:  [85-99] 88 (07/27 0610) Resp:  [16-18] 16 (07/27 0610) BP: (128-164)/(60-94) 148/81 mmHg (07/27 0610) SpO2:  [88 %-96 %] 93 % (07/27 0610)  Intake/Output from previous day: 07/26 0701 - 07/27 0700 In: 2040 [P.O.:240; I.V.:1800] Out: 425 [Urine:400; Blood:25]  Physical Exam:   General appearance: alert, cooperative, appears stated age, fatigued and mildly obese Lungs: scattered rhonchi Chest wall: no tenderness Heart: irregularly irregular rhythm, S1: variable, S2: normal, no S3 or S4 and no murmur Extremities: extremities normal, atraumatic, no cyanosis; trace LE edema Pulses: LE peripheral pulses faint bilaterally Skin: Skin color, texture, turgor normal. No rashes or lesions  Lab Results: BMP  Recent Labs  03/12/15 2349 04/03/15 1548 04/08/15 1452 04/09/15 0326  NA 133* 134*  --  133*  K 4.0 4.8  --  4.5  CL 103 103  --  102  CO2 19* 19*  --  24  GLUCOSE 183* 141*  --  142*  BUN 17 19  --  14  CREATININE 1.20 1.29* 1.20 1.14  CALCIUM 8.8* 9.0  --  8.9  GFRNONAA 57* 52* 57* >60  GFRAA >60 >60 >60 >60    CBC  Recent Labs Lab 04/09/15 0326  WBC 9.1  RBC 5.37  HGB 16.5  HCT 48.9  PLT 133*  MCV 91.1  MCH 30.7  MCHC 33.7  RDW 15.9*    HEMOGLOBIN A1C Lab Results  Component Value Date   HGBA1C 7.3* 04/03/2015   MPG 163 04/03/2015    Cardiac Panel (last 3 results)  Recent Labs  08/29/14 1539 03/12/15 2349  CKTOTAL 111  --   TROPONINI  --  0.03    BNP (last 3 results)  Recent Labs  08/29/14 1803 08/29/14 2140  PROBNP 1356.0* 1294.0*    TSH  Recent Labs  08/29/14 1803  08/29/14 2140 09/16/14 1250  TSH 1.690 0.993 1.466    CHOLESTEROL No results for input(s): CHOL in the last 8760 hours.  Hepatic Function Panel  Recent Labs  08/29/14 1808 09/16/14 1250 03/12/15 2349  PROT 8.2 6.3 7.4  ALBUMIN 4.2 3.4* 3.7  AST 24 19 23   ALT 17 19 18   ALKPHOS 39 29* 39  BILITOT 0.6 0.9 0.5  BILIDIR <0.2  --   --   IBILI NOT CALCULATED  --   --     Imaging: No results found.  Cardiac Studies:  EKG 04/08/2015 at 0949: A.fib with controlled ventricular response at a rate of 84 bpm, rightward axis, RBBB, LPFB. Diffuse, non-specific ST abnormality. Cannot exclude inferior infarct old. No significant change from prior EKGs.  Telemetry monitor 04/09/2015 at 0815: A. Fib with controlled ventricular response at a rate of 80-100.  Assessment/Plan:  1. Chronic Afib: CHA2DS2-VASc Score is 6 with yearly risk of stroke of 9.8%.  HAS-Bled score is 4 and estimated major bleeding in one year is 4.9-19.6%.  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 Patient's choice. 2. Chronic diastolic heart failure 3. CAD without angina 4. 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  Patient's HR remains relatively well controlled.  No chest pain, SOB, heart racing, or significant edema.  Taking medications and PO fluids without difficulty.  No changes.  Will continue to monitor.    Rachel Bo, NP-C 04/09/2015, 8:42 AM [facility] Pager: [phone] Office: [phone]

## 2015-04-09 NOTE — Op Note (Signed)
NAMEJSHAWN, HURTA                ACCOUNT NO.:  [id number]  MEDICAL RECORD NO.:  [id number]  LOCATION:  6N32C                        FACILITY:  [facility]  PHYSICIAN:  Earnstine Regal, MD      DATE OF BIRTH:  1941-05-13  DATE OF PROCEDURE:  04/08/2015                              OPERATIVE REPORT   PREOPERATIVE DIAGNOSES:  Chronic cholecystitis, cholelithiasis.  POSTOPERATIVE DIAGNOSES: 1. Chronic cholecystitis. 2. Inadvertent enterotomy.  PROCEDURES: 1. Exploratory laparotomy. 2. Lysis of adhesions (30 minutes). 3. Primary repair of enterotomy. 4. Open cholecystectomy.  SURGEON:  Earnstine Regal, MD, FACS  ANESTHESIA:  General per Dr. Sherren Kerns.  ESTIMATED BLOOD LOSS:  Minimal.  PREPARATION:  ChloraPrep.  COMPLICATIONS:  Inadvertent enterotomy upon attempted entry into peritoneal cavity by open technique through midline surgical incision.  INDICATIONS:  The patient is a 74 year old male referred by his gastroenterologist for symptomatic cholelithiasis and right upper quadrant abdominal pain.  The patient had a known history of cholelithiasis, noted on CT scan in May 2016.  The patient had right upper quadrant abdominal pain, which waxed and waned in severity.  He had had nausea, but no emesis.  The patient now comes to Surgery for cholecystectomy.  BODY OF REPORT:  Procedure was done in OR #2 at the [facility].  The patient was brought to the operating room and placed in supine position on the operating room table.  Following administration of general anesthesia, the patient was prepped and draped in the usual aseptic fashion.  After ascertaining that an adequate level of anesthesia had been achieved, an incision was made just below the umbilicus in the midline.  Dissection was carried through previous scar tissue down to the fascia.  Fascia was visualized and incised with a #15 blade.  Dissection was carried just deep to the fascia and the  tissues were spread with a Kelly clamp.  Free space was identified.  A 0 Vicryl pursestring suture was placed in the fascia.  A Hasson cannula was introduced.  Laparoscope was introduced and visualization was of small intestine mucosa.  Scope was withdrawn and the wound was explored locally.  It appeared that there was an enterotomy made a loop of small bowel which was densely adherent to the undersurface of the fascia. Decision was made to convert to open surgery.  Midline incision was opened with a #10 blade.  Dissection was carried through the subcutaneous tissues down to the fascia.  Previously placed permanent suture material was extracted through the length of the incision.  Fascial edges were grasped and elevated.  Using sharp dissection, the loop of small bowel was freed from the undersurface of the fascia at the level of the enterotomy.  The fascia was then opened under direct vision cephalad and caudad until the entire midline incision was opened widely.  Adhesions were taken down sharply with the Metzenbaum scissors.  The involved loop of small bowel was mobilized for several centimeters along its length.  Enterotomy measured less than 1 cm in diameter.  It was at the antimesenteric border.  Tissue was appeared healthy.  Enterotomy was closed with interrupted 3-0 silk simple sutures.  There was  no significant narrowing of the small bowel lumen.  There was no evidence of leakage.  There was minimal spillage.  Abdomen was irrigated with warm saline.  Further lysis of adhesions in the upper abdomen and right upper quadrant allows for visualization of the liver and the gallbladder.  Gallbladder has significant adhesions to the transverse colon.  A Bookwalter retractor was placed for exposure.  The gallbladder was dissected away from the transverse colon sharply with the Metzenbaum scissors.  Packs were placed around the gallbladder. Gallbladder was then taken down from the  gallbladder fossa retrograde using the electrocautery for hemostasis.  Dissection was carried down to the neck of the gallbladder where branches of the cystic artery were divided between Ligaclips with the electrocautery.  Neck of the gallbladder was dissected down to the cystic duct, which was positively identified and then divided between Ligaclips.  The gallbladder was passed off the field.  Abdomen was again irrigated with warm saline. Good hemostasis was noted throughout.  Omentum was used to cover the small bowel.  Enterotomy was inspected again and appeared to be well closed with no evidence of leakage.  Omentum was used to cover the small bowel.  Midline incision was closed with interrupted #1 Novafil simple sutures. Subcutaneous tissues were irrigated.  Skin was closed with stainless steel staples.  Sterile dressings were applied.  The patient was awakened from anesthesia and brought to the recovery room.  The patient tolerated the procedure well.   Earnstine Regal, MD, [facility] Office: [phone]   TMG/MEDQ  D:  04/08/2015  T:  04/09/2015  Job:  [id number]  cc:   Jeryl Columbia, M.D. Donavan Burnet, M.D.

## 2015-04-09 NOTE — Progress Notes (Signed)
Patient ID: [name], male   DOB: 11-03-1940, 74 y.o.   MRN: [id number]  [facility]  POD#: 1  Subjective: Patient with some pain, up to bathroom.  Family at bedside.  Taking limited clear liquids.  Objective: Vital signs in last 24 hours: Temp:  [97.5 F (36.4 C)-99.2 F (37.3 C)] 98.1 F (36.7 C) (07/27 0610) Pulse Rate:  [85-99] 88 (07/27 0610) Resp:  [16-18] 16 (07/27 0610) BP: (128-164)/(60-94) 148/81 mmHg (07/27 0610) SpO2:  [88 %-96 %] 93 % (07/27 0610) Last BM Date: 04/07/15  Intake/Output from previous day: 07/26 0701 - 07/27 0700 In: 2040 [P.O.:240; I.V.:1800] Out: 425 [Urine:400; Blood:25] Intake/Output this shift:    Physical Exam: HEENT - sclerae clear, mucous membranes moist Neck - soft Chest - few rhonchi on right Cor - irreg, rate controlled Abdomen - midline dressing dry and intact Ext - no edema, non-tender Neuro - alert & oriented, no focal deficits  Lab Results:   Recent Labs  04/08/15 1452 04/09/15 0326  WBC 11.0* 9.1  HGB 16.9 16.5  HCT 48.7 48.9  PLT 126* 133*   BMET  Recent Labs  04/08/15 1452 04/09/15 0326  NA  --  133*  K  --  4.5  CL  --  102  CO2  --  24  GLUCOSE  --  142*  BUN  --  14  CREATININE 1.20 1.14  CALCIUM  --  8.9   PT/INR No results for input(s): LABPROT, INR in the last 72 hours. Comprehensive Metabolic Panel:    Component Value Date/Time   NA 133* 04/09/2015 0326   NA 134* 04/03/2015 1548   K 4.5 04/09/2015 0326   K 4.8 04/03/2015 1548   CL 102 04/09/2015 0326   CL 103 04/03/2015 1548   CO2 24 04/09/2015 0326   CO2 19* 04/03/2015 1548   BUN 14 04/09/2015 0326   BUN 19 04/03/2015 1548   CREATININE 1.14 04/09/2015 0326   CREATININE 1.20 04/08/2015 1452   GLUCOSE 142* 04/09/2015 0326   GLUCOSE 141* 04/03/2015 1548   CALCIUM 8.9 04/09/2015 0326   CALCIUM 9.0 04/03/2015 1548   AST 23 03/12/2015 2349   AST 19 09/16/2014 1250   ALT 18 03/12/2015 2349   ALT  19 09/16/2014 1250   ALKPHOS 39 03/12/2015 2349   ALKPHOS 29* 09/16/2014 1250   BILITOT 0.5 03/12/2015 2349   BILITOT 0.9 09/16/2014 1250   PROT 7.4 03/12/2015 2349   PROT 6.3 09/16/2014 1250   ALBUMIN 3.7 03/12/2015 2349   ALBUMIN 3.4* 09/16/2014 1250    Studies/Results: No results found.  Anti-infectives: Anti-infectives    Start     Dose/Rate Route Frequency Ordered Stop   04/09/15 0800  ertapenem (INVANZ) 1 g in sodium chloride 0.9 % 50 mL IVPB     1 g 100 mL/hr over 30 Minutes Intravenous Every 24 hours 04/08/15 1248     04/08/15 0830  ertapenem (INVANZ) 1 g in sodium chloride 0.9 % 50 mL IVPB     1 g 100 mL/hr over 30 Minutes Intravenous To Surgery 04/08/15 0819 04/08/15 0825   04/08/15 0623  ceFAZolin (ANCEF) IVPB 2 g/50 mL premix     2 g 100 mL/hr over 30 Minutes Intravenous On call to O.R. 04/08/15 0932 04/08/15 0721   04/08/15 0533  ceFAZolin (ANCEF) 2-3 GM-% IVPB SOLR    Comments:  Scronce, Trina   : cabinet override      04/08/15 0533  04/08/15 1744      Assessment & Plans: Status post laparotomy, cholecystectomy, repair of enterotomy  Advance to full liquid diet today if tolerated  Ambulate in halls  Encouraged IS use Afib, CHF  Appreciate cardiology evaluation and assistance post-op  Likely home in 1-2 days if tolerating diet and ambulating in halls.  Earnstine Regal, MD, [facility] Office: [place] 04/09/2015

## 2015-04-10 LAB — GLUCOSE, CAPILLARY
GLUCOSE-CAPILLARY: 141 mg/dL — AB (ref 65–99)
Glucose-Capillary: 165 mg/dL — ABNORMAL HIGH (ref 65–99)

## 2015-04-10 MED ORDER — HYDROCODONE-ACETAMINOPHEN 5-325 MG PO TABS: 1.0000 | ORAL_TABLET | ORAL | Status: DC | PRN

## 2015-04-10 NOTE — Discharge Summary (Signed)
Physician Discharge Summary [facility]  Patient ID: [name] MRN: [id number] DOB/AGE: Jan 03, 1941 74 y.o.  Admit date: 04/08/2015 Discharge date: 04/10/2015  Admission Diagnoses:  Chronic cholecystitis, cholelithiasis  Discharge Diagnoses:  Principal Problem:   Chronic cholecystitis with calculus   Discharged Condition: good  Hospital Course: patient admitted after open laparotomy and cholecystectomy.  Post op course uncomplicated.  Diet advanced and tolerated.  Ambulating with assistance.  Pain controlled with po pain meds.  Patient and family desire discharge on POD#2.  Consults: cardiology  Treatments: surgery: laparotomy with open cholecystectomy  Discharge Exam: Blood pressure 156/79, pulse 84, temperature 98.4 F (36.9 C), temperature source Oral, resp. rate 18, height 6' (1.829 m), weight 92.534 kg (204 lb), SpO2 96 %. See progress note this AM.  Disposition: Home  Discharge Instructions    Diet - low sodium heart healthy    Complete by:  As directed      Discharge instructions    Complete by:  As directed   [facility]  OPEN ABDOMINAL SURGERY: POST OP INSTRUCTIONS  Always review your discharge instruction sheet given to you by the facility where your surgery was performed.  A prescription for pain medication may be given to you upon discharge.  Take your pain medication as prescribed.  If narcotic pain medicine is not needed, then you may take acetaminophen (Tylenol) or ibuprofen (Advil) as needed. Take your usually prescribed medications unless otherwise directed. If you need a refill on your pain medication, please contact your pharmacy. They will contact our office to request authorization.  Prescriptions will not be filled after 5 pm or on weekends. You should follow a light diet the first few days after arrival home, such as soup and crackers, unless your doctor has advised otherwise. A high-fiber, low fat diet can be  resumed as tolerated.  Be sure to include plenty of fluids daily.  Most patients will experience some swelling and bruising in the area of the incision. Ice packs will help. Swelling and bruising can take several days to resolve. It is common to experience some constipation if taking pain medication after surgery.  Increasing fluid intake and taking a stool softener will usually help or prevent this problem from occurring.  A mild laxative (Milk of Magnesia or Miralax) should be taken according to package directions if there are no bowel movements after 48 hours.  You may have steri-strips (small skin tapes) in place directly over the incision.  These strips should be left on the skin for 5-7 days.  Any sutures or staples will be removed at the office during your follow-up visit. You may find that a light gauze bandage over your incision may keep your staples from being rubbed or pulled. You may shower and replace the bandage daily. ACTIVITIES:  You may resume regular (light) daily activities beginning the next day - such as daily self-care, walking, climbing stairs - gradually increasing activities as tolerated.  You may have sexual intercourse when it is comfortable.  Refrain from any heavy lifting or straining until approved by your doctor.  You may drive when you no longer are taking prescription pain medication, you can comfortably wear a seatbelt, and you can safely maneuver your car and apply brakes. You should see your doctor in the office for a follow-up appointment approximately 2-3 weeks after your surgery.  Make sure that you call for this appointment within a day or two after you arrive home to insure a  convenient appointment time.  WHEN TO CALL YOUR DOCTOR: Fever greater than 101.0 Inability to urinate Persistent nausea and/or vomiting Extreme swelling or bruising Continued bleeding from incision Increased pain, redness, or drainage from the incision Difficulty swallowing or  breathing Muscle cramping or spasms Numbness or tingling in hands or around lips  IF YOU HAVE DISABILITY OR FAMILY LEAVE FORMS, YOU MUST BRING THEM TO THE OFFICE FOR PROCESSING.  PLEASE DO NOT GIVE THEM TO YOUR DOCTOR.  The clinic staff is available to answer your questions during regular business hours.  Please don't hesitate to call and ask to speak to one of the nurses if you have concerns.  [facility] [place] Office: [phone]  For further questions, please visit [email or web address]     Increase activity slowly    Complete by:  As directed      No dressing needed    Complete by:  As directed             Medication List    TAKE these medications        albuterol 108 (90 BASE) MCG/ACT inhaler  Commonly known as:  PROVENTIL HFA;VENTOLIN HFA  Inhale 1-2 puffs into the lungs every 4 (four) hours as needed for wheezing or shortness of breath.     aspirin EC 81 MG tablet  Take 81 mg by mouth 2 (two) times daily.     azelastine 0.1 % nasal spray  Commonly known as:  ASTELIN  Place 1 spray into both nostrils daily as needed for allergies.     digoxin 0.125 MG tablet  Commonly known as:  LANOXIN  Take 0.125 mg by mouth daily.     fenofibrate 145 MG tablet  Commonly known as:  TRICOR  Take 145 mg by mouth daily after breakfast.     fluticasone 110 MCG/ACT inhaler  Commonly known as:  FLOVENT HFA  Inhale 1 puff into the lungs 2 (two) times daily.     GENTEAL OP  Place 1 drop into both eyes 2 (two) times daily as needed (dry eye).     HYDROcodone-acetaminophen 5-325 MG per tablet  Commonly known as:  NORCO/VICODIN  Take 1-2 tablets by mouth every 4 (four) hours as needed for moderate pain.     ketoconazole 2 % cream  Commonly known as:  NIZORAL  Apply 1 application topically daily. Apply to rash on feet     lisinopril 2.5 MG tablet  Commonly known as:  PRINIVIL,ZESTRIL  Take 2.5 mg by mouth daily after breakfast.     metFORMIN 1000 MG  tablet  Commonly known as:  GLUCOPHAGE  Take 1,000 mg by mouth 2 (two) times daily with a meal.     metoprolol 50 MG tablet  Commonly known as:  LOPRESSOR  Take 50 mg by mouth 2 (two) times daily.     nitroGLYCERIN 0.4 MG SL tablet  Commonly known as:  NITROSTAT  Place 0.4 mg under the tongue every 5 (five) minutes x 3 doses as needed for chest pain. For chest pain     pravastatin 40 MG tablet  Commonly known as:  PRAVACHOL  Take 40 mg by mouth at bedtime.     spironolactone 50 MG tablet  Commonly known as:  ALDACTONE  Take 50 mg by mouth daily.     traMADol 50 MG tablet  Commonly known as:  ULTRAM  Take 50 mg by mouth every 8 (eight) hours as needed for severe pain.  Follow-up Information    Schedule an appointment as soon as possible for a visit with Earnstine Regal, MD.   Specialty:  General Surgery   Why:  For suture removal   Contact information:   [place] [address] [phone]       Earnstine Regal, MD, [facility] Office: [phone]   Signed: Earnstine Regal 04/10/2015, 5:14 PM

## 2015-04-10 NOTE — Progress Notes (Signed)
Patient discharged home with instructions. 

## 2015-04-10 NOTE — Progress Notes (Signed)
Patient ID: [name], male   DOB: 07-17-1941, 74 y.o.   MRN: [id number]  [facility]  POD#: 2  Subjective: Patient in bed, family at bedside.  Hungry.  Wants to go home.  Tolerated full liquids without nausea or emesis.  Small BM, passing flatus.  Objective: Vital signs in last 24 hours: Temp:  [98.1 F (36.7 C)-98.4 F (36.9 C)] 98.2 F (36.8 C) (07/28 0612) Pulse Rate:  [68-110] 94 (07/28 0612) Resp:  [16-18] 18 (07/28 0612) BP: (128-154)/(78-85) 137/78 mmHg (07/28 0612) SpO2:  [92 %-96 %] 96 % (07/28 0612) Last BM Date: 04/07/15  Intake/Output from previous day: 07/27 0701 - 07/28 0700 In: 2165 [P.O.:240; I.V.:1875; IV Piggyback:50] Out: 250 [Urine:250] Intake/Output this shift:    Physical Exam: HEENT - sclerae clear, mucous membranes moist Neck - soft Chest - clear bilaterally Cor - irreg, rate controlled Abdomen - soft, midline wound intact, dry, staples in place; BS present Ext - no edema, non-tender Neuro - alert & oriented, no focal deficits  Lab Results:   Recent Labs  04/08/15 1452 04/09/15 0326  WBC 11.0* 9.1  HGB 16.9 16.5  HCT 48.7 48.9  PLT 126* 133*   BMET  Recent Labs  04/08/15 1452 04/09/15 0326  NA  --  133*  K  --  4.5  CL  --  102  CO2  --  24  GLUCOSE  --  142*  BUN  --  14  CREATININE 1.20 1.14  CALCIUM  --  8.9   PT/INR No results for input(s): LABPROT, INR in the last 72 hours. Comprehensive Metabolic Panel:    Component Value Date/Time   NA 133* 04/09/2015 0326   NA 134* 04/03/2015 1548   K 4.5 04/09/2015 0326   K 4.8 04/03/2015 1548   CL 102 04/09/2015 0326   CL 103 04/03/2015 1548   CO2 24 04/09/2015 0326   CO2 19* 04/03/2015 1548   BUN 14 04/09/2015 0326   BUN 19 04/03/2015 1548   CREATININE 1.14 04/09/2015 0326   CREATININE 1.20 04/08/2015 1452   GLUCOSE 142* 04/09/2015 0326   GLUCOSE 141* 04/03/2015 1548   CALCIUM 8.9 04/09/2015 0326   CALCIUM 9.0 04/03/2015 1548    AST 23 03/12/2015 2349   AST 19 09/16/2014 1250   ALT 18 03/12/2015 2349   ALT 19 09/16/2014 1250   ALKPHOS 39 03/12/2015 2349   ALKPHOS 29* 09/16/2014 1250   BILITOT 0.5 03/12/2015 2349   BILITOT 0.9 09/16/2014 1250   PROT 7.4 03/12/2015 2349   PROT 6.3 09/16/2014 1250   ALBUMIN 3.7 03/12/2015 2349   ALBUMIN 3.4* 09/16/2014 1250    Studies/Results: No results found.  Anti-infectives: Anti-infectives    Start     Dose/Rate Route Frequency Ordered Stop   04/09/15 0800  ertapenem (INVANZ) 1 g in sodium chloride 0.9 % 50 mL IVPB     1 g 100 mL/hr over 30 Minutes Intravenous Every 24 hours 04/08/15 1248     04/08/15 0830  ertapenem (INVANZ) 1 g in sodium chloride 0.9 % 50 mL IVPB     1 g 100 mL/hr over 30 Minutes Intravenous To Surgery 04/08/15 0819 04/08/15 0825   04/08/15 0623  ceFAZolin (ANCEF) IVPB 2 g/50 mL premix     2 g 100 mL/hr over 30 Minutes Intravenous On call to O.R. 04/08/15 6270 04/08/15 0721   04/08/15 0533  ceFAZolin (ANCEF) 2-3 GM-% IVPB SOLR    Comments:  Scronce, Trina   : cabinet override      04/08/15 0533 04/08/15 1744      Assessment & Plans: Status post open cholecystectomy, repair of enterotomy  Advance to regular diet this AM  PO pain Rx  Possibly home this afternoon or in AM 7/29 Chronic Afib  Appreciate cardiology follow up  Earnstine Regal, MD, [facility] Office: [place] 04/10/2015

## 2015-04-10 NOTE — Care Management Important Message (Signed)
Important Message  Patient Details  Name: [name] MRN: [id number] Date of Birth: March 30, 1941   Medicare Important Message Given:  Yes-second notification given    Delorse Lek 04/10/2015, 11:12 AM

## 2015-06-10 ENCOUNTER — Emergency Department (HOSPITAL_COMMUNITY)
Admission: EM | Admit: 2015-06-10 | Discharge: 2015-06-10 | Disposition: A | Discharge status: Home / Self Care | Payer: Medicare Other | Attending: Physician Assistant | Admitting: Physician Assistant

## 2015-06-10 ENCOUNTER — Emergency Department (HOSPITAL_COMMUNITY): Payer: Medicare Other

## 2015-06-10 ENCOUNTER — Encounter (HOSPITAL_COMMUNITY): Payer: Self-pay | Admitting: Emergency Medicine

## 2015-06-10 DIAGNOSIS — R5383 Other fatigue: Secondary | ICD-10-CM | POA: Diagnosis present

## 2015-06-10 DIAGNOSIS — R55 Syncope and collapse: Secondary | ICD-10-CM | POA: Diagnosis present

## 2015-06-10 DIAGNOSIS — R531 Weakness: Secondary | ICD-10-CM | POA: Diagnosis present

## 2015-06-10 DIAGNOSIS — E118 Type 2 diabetes mellitus with unspecified complications: Secondary | ICD-10-CM

## 2015-06-10 DIAGNOSIS — I2510 Atherosclerotic heart disease of native coronary artery without angina pectoris: Secondary | ICD-10-CM | POA: Diagnosis not present

## 2015-06-10 DIAGNOSIS — I10 Essential (primary) hypertension: Secondary | ICD-10-CM

## 2015-06-10 DIAGNOSIS — I4891 Unspecified atrial fibrillation: Secondary | ICD-10-CM | POA: Diagnosis not present

## 2015-06-10 DIAGNOSIS — Z79899 Other long term (current) drug therapy: Secondary | ICD-10-CM | POA: Insufficient documentation

## 2015-06-10 DIAGNOSIS — Z85528 Personal history of other malignant neoplasm of kidney: Secondary | ICD-10-CM | POA: Insufficient documentation

## 2015-06-10 DIAGNOSIS — E7800 Pure hypercholesterolemia, unspecified: Secondary | ICD-10-CM | POA: Diagnosis present

## 2015-06-10 DIAGNOSIS — Z7951 Long term (current) use of inhaled steroids: Secondary | ICD-10-CM | POA: Diagnosis not present

## 2015-06-10 DIAGNOSIS — I482 Chronic atrial fibrillation, unspecified: Secondary | ICD-10-CM | POA: Diagnosis present

## 2015-06-10 DIAGNOSIS — D696 Thrombocytopenia, unspecified: Secondary | ICD-10-CM | POA: Diagnosis present

## 2015-06-10 DIAGNOSIS — Z8679 Personal history of other diseases of the circulatory system: Secondary | ICD-10-CM

## 2015-06-10 DIAGNOSIS — E119 Type 2 diabetes mellitus without complications: Secondary | ICD-10-CM | POA: Diagnosis not present

## 2015-06-10 DIAGNOSIS — J449 Chronic obstructive pulmonary disease, unspecified: Secondary | ICD-10-CM | POA: Insufficient documentation

## 2015-06-10 DIAGNOSIS — I252 Old myocardial infarction: Secondary | ICD-10-CM | POA: Insufficient documentation

## 2015-06-10 DIAGNOSIS — Z7982 Long term (current) use of aspirin: Secondary | ICD-10-CM | POA: Diagnosis not present

## 2015-06-10 DIAGNOSIS — I5042 Chronic combined systolic (congestive) and diastolic (congestive) heart failure: Secondary | ICD-10-CM | POA: Diagnosis not present

## 2015-06-10 DIAGNOSIS — M1990 Unspecified osteoarthritis, unspecified site: Secondary | ICD-10-CM | POA: Diagnosis not present

## 2015-06-10 DIAGNOSIS — I509 Heart failure, unspecified: Secondary | ICD-10-CM | POA: Insufficient documentation

## 2015-06-10 DIAGNOSIS — Z8719 Personal history of other diseases of the digestive system: Secondary | ICD-10-CM

## 2015-06-10 DIAGNOSIS — E782 Mixed hyperlipidemia: Secondary | ICD-10-CM | POA: Insufficient documentation

## 2015-06-10 DIAGNOSIS — R5382 Chronic fatigue, unspecified: Secondary | ICD-10-CM

## 2015-06-10 LAB — COMPREHENSIVE METABOLIC PANEL
ALT: 19 U/L (ref 17–63)
AST: 24 U/L (ref 15–41)
Albumin: 3.6 g/dL (ref 3.5–5.0)
Alkaline Phosphatase: 37 U/L — ABNORMAL LOW (ref 38–126)
Anion gap: 11 (ref 5–15)
BILIRUBIN TOTAL: 0.7 mg/dL (ref 0.3–1.2)
BUN: 17 mg/dL (ref 6–20)
CHLORIDE: 104 mmol/L (ref 101–111)
CO2: 19 mmol/L — ABNORMAL LOW (ref 22–32)
CREATININE: 1.01 mg/dL (ref 0.61–1.24)
Calcium: 9.9 mg/dL (ref 8.9–10.3)
Glucose, Bld: 126 mg/dL — ABNORMAL HIGH (ref 65–99)
POTASSIUM: 4.4 mmol/L (ref 3.5–5.1)
Sodium: 134 mmol/L — ABNORMAL LOW (ref 135–145)
Total Protein: 6.7 g/dL (ref 6.5–8.1)

## 2015-06-10 LAB — CBC
HCT: 49 % (ref 39.0–52.0)
HEMOGLOBIN: 17.4 g/dL — AB (ref 13.0–17.0)
MCH: 32 pg (ref 26.0–34.0)
MCHC: 35.5 g/dL (ref 30.0–36.0)
MCV: 90.2 fL (ref 78.0–100.0)
PLATELETS: 124 10*3/uL — AB (ref 150–400)
RBC: 5.43 MIL/uL (ref 4.22–5.81)
RDW: 15.8 % — AB (ref 11.5–15.5)
WBC: 8.1 10*3/uL (ref 4.0–10.5)

## 2015-06-10 LAB — URINALYSIS, ROUTINE W REFLEX MICROSCOPIC
BILIRUBIN URINE: NEGATIVE
Glucose, UA: NEGATIVE mg/dL
HGB URINE DIPSTICK: NEGATIVE
Ketones, ur: NEGATIVE mg/dL
Leukocytes, UA: NEGATIVE
Nitrite: NEGATIVE
PROTEIN: NEGATIVE mg/dL
Specific Gravity, Urine: 1.011 (ref 1.005–1.030)
UROBILINOGEN UA: 0.2 mg/dL (ref 0.0–1.0)
pH: 6 (ref 5.0–8.0)

## 2015-06-10 LAB — CBG MONITORING, ED: Glucose-Capillary: 127 mg/dL — ABNORMAL HIGH (ref 65–99)

## 2015-06-10 LAB — I-STAT CG4 LACTIC ACID, ED
LACTIC ACID, VENOUS: 2.29 mmol/L — AB (ref 0.5–2.0)
Lactic Acid, Venous: 1.23 mmol/L (ref 0.5–2.0)

## 2015-06-10 LAB — I-STAT TROPONIN, ED: TROPONIN I, POC: 0.01 ng/mL (ref 0.00–0.08)

## 2015-06-10 LAB — BRAIN NATRIURETIC PEPTIDE: B Natriuretic Peptide: 122.7 pg/mL — ABNORMAL HIGH (ref 0.0–100.0)

## 2015-06-10 LAB — DIGOXIN LEVEL: Digoxin Level: 0.2 ng/mL — ABNORMAL LOW (ref 0.8–2.0)

## 2015-06-10 MED ORDER — SODIUM CHLORIDE 0.9 % IV BOLUS (SEPSIS)
1000.0000 mL | Freq: Once | INTRAVENOUS | Status: AC
  Administered 2015-06-10: 1000 mL via INTRAVENOUS

## 2015-06-10 NOTE — Consult Note (Signed)
[facility] Initial Consult Note   [name]  PJP:[id number]  DOB: July 07, 1941  DOA: 06/10/2015 DOS: the patient was seen and examined on 06/10/2015  PCP: Donnie Coffin, MD   Referring physician: Dr. Darlyn Chamber gaddy, Dr. Daune Perch Reason for consult: Admission  HPI: [name] is a 74 y.o. male with Past medical history of coronary artery disease, chronic atrial fibrillation, not on any anticoagulation due to recurrent lower GI bleeding, chronic combined CHF, essential hypertension, dyslipidemia, diabetes mellitus on metformin. Patient is coming from home The patient is presenting with complaints of dizziness episode that occurred today. Patient mentions that today while he was walking in the house and went to the kitchen he suddenly felt that he was going to pass out and felt generally weak. His family member was around in the lower him to the chair. He started having some shaking episodes but did not lose any control of bowel or bladder or not have any tongue bite. He did not loss consciousness as well as he was conscious throughout the whole episode. He took a while for him to regain his strength but he was talking with the EMS while they were arrived. At that event the patient did not have any complaints of palpitation or chest pain or shortness of breath or focal tingling numbness or weakness. Patient also does not have any complaints of abdominal pain. Patient denies having episodes of nausea vomiting diarrhea or constipation or burning urination or active bleeding anywhere over last few days. Patient denies having any shortness of breath, orthopnea, cough, PND over last few days. Patient has not been eating appropriately or long period of time since 2012. Patient denies having any vertigo sensation. Denies having any vision changes no headache no speech difficulty no eating difficulty no choking episode. Denies having any recent change in his medications  Review of  Systems: as mentioned in the history of present illness.  A Comprehensive review of the other systems is negative.  Past Medical History  Diagnosis Date  . CAD (coronary artery disease)   . Acute non-ST segment elevation myocardial infarction   . Left renal mass   . Diverticulitis   . HTN (hypertension)   . Myocardial infarction 06/19/2011  . Ulcer 1960's    "stomach"  . Blood transfusion     "related to surgeries"  . Diverticulitis 1996  . CHF (congestive heart failure)   . High cholesterol   . Arthritis     "hands" (09/16/2014)  . COPD (chronic obstructive pulmonary disease)   . Type II diabetes mellitus     type II  . Renal neoplasm     LEFT  . Dysrhythmia     A. Fib  . Cancer     kidney   Past Surgical History  Procedure Laterality Date  . Coronary artery bypass graft  06/2011    4 VESSELS  . Colon resection  2005    FOR DIVERTICULITIS  . Robot assisted laparoscopic nephrectomy  11/29/2011    Procedure: ROBOTIC ASSISTED LAPAROSCOPIC NEPHRECTOMY;  Surgeon: Dutch Gray, MD;  Location: [facility];  Service: Urology;  Laterality: Left;  PARTIAL left nephrectomy    . Cardioversion  01/11/2012    Procedure: CARDIOVERSION;  Surgeon: Laverda Page, MD;  Location: [place];  Service: Cardiovascular;  Laterality: N/A;  . Colonoscopy  02/02/2012    Procedure: COLONOSCOPY;  Surgeon: Jeryl Columbia, MD;  Location: [facility];  Service: Endoscopy;  Laterality: N/A;  magod /ja  .  Hernia repair Bilateral X 4    "twice on each side"  . Colon surgery    . Cataract extraction w/ intraocular lens  implant, bilateral Bilateral   . Appendectomy    . Exploratory laparotomy  04/08/2015  . Cholecystectomy  04/08/2015  . Laparoscopic lysis of adhesions  04/08/2015  . Laparotomy N/A 04/08/2015    Procedure: EXPLORATORY LAPAROTOMY, REPAIR OF ENTEROTOMY;  Surgeon: Armandina Gemma, MD;  Location: [place];  Service: General;  Laterality: N/A;  . Lysis of adhesion  04/08/2015    Procedure: LYSIS OF ADHESION;   Surgeon: Armandina Gemma, MD;  Location: [place];  Service: General;;  . Cholecystectomy  04/08/2015    Procedure: CHOLECYSTECTOMY;  Surgeon: Armandina Gemma, MD;  Location: [place];  Service: General;;   Social History:  reports that he has been smoking Cigarettes.  He has a 26 pack-year smoking history. He has never used smokeless tobacco. He reports that he does not drink alcohol or use illicit drugs.  No Known Allergies  Family History  Problem Relation Age of Onset  . Heart disease Father   . Alzheimer's disease Mother   . Alcohol abuse Brother   . Cancer Sister     Prior to Admission medications   Medication Sig Start Date End Date Taking? Authorizing Provider  albuterol (PROVENTIL HFA;VENTOLIN HFA) 108 (90 BASE) MCG/ACT inhaler Inhale 1-2 puffs into the lungs every 4 (four) hours as needed for wheezing or shortness of breath.   Yes Historical Provider, MD  aspirin EC 81 MG tablet Take 81 mg by mouth 2 (two) times daily.   Yes Historical Provider, MD  Carboxymethylcell-Hypromellose (GENTEAL OP) Place 1 drop into both eyes 2 (two) times daily.    Yes Historical Provider, MD  digoxin (LANOXIN) 0.125 MG tablet Take 0.125 mg by mouth daily.   Yes Historical Provider, MD  fenofibrate (TRICOR) 145 MG tablet Take 145 mg by mouth daily after breakfast.    Yes Historical Provider, MD  fluticasone (FLOVENT HFA) 110 MCG/ACT inhaler Inhale 1 puff into the lungs 2 (two) times daily. 08/30/14  Yes Bonnielee Haff, MD  ketoconazole (NIZORAL) 2 % cream Apply 1 application topically daily. Apply to rash on feet   Yes Historical Provider, MD  lisinopril (PRINIVIL,ZESTRIL) 2.5 MG tablet Take 2.5 mg by mouth daily after breakfast.    Yes Historical Provider, MD  metFORMIN (GLUCOPHAGE) 1000 MG tablet Take 1,000 mg by mouth 2 (two) times daily with a meal.   Yes Historical Provider, MD  metoprolol (LOPRESSOR) 50 MG tablet Take 50 mg by mouth 2 (two) times daily.  12/27/13  Yes Historical Provider, MD  nitroGLYCERIN  (NITROSTAT) 0.4 MG SL tablet Place 0.4 mg under the tongue every 5 (five) minutes x 3 doses as needed for chest pain. For chest pain   Yes Historical Provider, MD  pravastatin (PRAVACHOL) 40 MG tablet Take 40 mg by mouth at bedtime.  11/27/13  Yes Historical Provider, MD  spironolactone (ALDACTONE) 50 MG tablet Take 50 mg by mouth daily.   Yes Historical Provider, MD  traMADol (ULTRAM) 50 MG tablet Take 50 mg by mouth every 8 (eight) hours as needed for severe pain.  01/02/14  Yes Historical Provider, MD    Physical Exam: Filed Vitals:   06/10/15 2000 06/10/15 2015 06/10/15 2030 06/10/15 2045  BP: 135/82     Pulse: 85 83 74 87  Temp:      TempSrc:      Resp: 25 22 21 26   SpO2:  97% 97% 96% 97%    General: Alert, Awake and Oriented to Time, Place and Person. Appear in no distress Eyes: PERRL ENT: Oral Mucosa clear moist. Neck: no JVD Cardiovascular: S1 and S2 Present, no Murmur, Peripheral Pulses Present Respiratory: Bilateral Air entry equal and Decreased,  Clear to Auscultation, no Crackles, no wheezes Abdomen: Bowel Sound present, Soft and no tenderness Skin: no Rash Extremities: no Pedal edema, no calf tenderness Neurologic:Mental status AAOx3, speech normal, attention normal,  Cranial Nerves PERRL, EOM normal and present,  Motor strength bilateral equal strength 5/5,  Sensation present to light touch,  Reflexes present knee and biceps, babinski negative,  Cerebellar test normal finger nose finger.  Labs:  CBC:  Recent Labs Lab 06/10/15 1739  WBC 8.1  HGB 17.4*  HCT 49.0  MCV 90.2  PLT 315*   Basic Metabolic Panel:  Recent Labs Lab 06/10/15 1535  NA 134*  K 4.4  CL 104  CO2 19*  GLUCOSE 126*  BUN 17  CREATININE 1.01  CALCIUM 9.9   Liver Function Tests:  Recent Labs Lab 06/10/15 1535  AST 24  ALT 19  ALKPHOS 37*  BILITOT 0.7  PROT 6.7  ALBUMIN 3.6   No results for input(s): LIPASE, AMYLASE in the last 168 hours. No results for input(s): AMMONIA  in the last 168 hours.  Cardiac Enzymes: No results for input(s): CKTOTAL, CKMB, CKMBINDEX, TROPONINI in the last 168 hours.  BNP (last 3 results)  Recent Labs  08/29/14 1803 08/29/14 2140  PROBNP 1356.0* 1294.0*   CBG:  Recent Labs Lab 06/10/15 1522  GLUCAP 127*    Radiological Exams: Dg Chest 2 View  06/10/2015   CLINICAL DATA:  Shortness of breath and weakness  EXAM: CHEST  2 VIEW  COMPARISON:  April 11, 2015  FINDINGS: Chronic interstitial thickening is stable. There is no frank edema or consolidation. Heart size and pulmonary vascularity are normal. No adenopathy. Patient is status post coronary artery bypass grafting. No bone lesions are appreciable.  IMPRESSION: Chronic interstitial thickening is stable. There is no frank edema or consolidation. No change in cardiac silhouette.   Electronically Signed   By: Lowella Grip III M.D.   On: 06/10/2015 17:26    EKG: Independently reviewed. atrial fibrillation, rate controlled.  Assessment/Plan Active Problems:   CAD (coronary artery disease)   Essential hypertension, benign   Pure hypercholesterolemia   History of GI diverticular bleed   Chronic a-fib   Diabetes   Chronic combined systolic and diastolic CHF (congestive heart failure)   Near syncope   Thrombocytopenia   Fatigue    1. Near syncope with fatigue. Patient arrived to the ER with an episode of near syncope. EKG shows atrial fibrillation telemetry rhythm shows A. fib with occasional PVCs. Patient was not hypoxic throughout my evaluation. Orthostatic vitals have been normal without any drop. Patient was initially having increased lactic acid levels which improved with IV hydration. Patient does not appear to be having any acute decompensated CHF. Patient does not appear to be having any symptoms of stroke or TIA. No seizure-like episode suspected. Patient had an echocardiogram which did not show any significant valvular disease. Ejection fraction 45%. At  this point I suspect that this event was most like secondary to mild dehydration which is improved with IV hydration. Patient was recommended to increase intake of fluids and follow-up with PCP as well as cardiologist within next week to adjust his medications specifically Aldactone. Patient also has weighing scale at home  as well as blood pressure machine at home which I recommended to continue monitoring his blood pressure as well as daily weight. Patient was recommended to return to ER should he have another episode of dizziness or lightheadedness or should he have low blood pressure or any other concerning symptoms.  2. Chronic fatigue. Patient was recommended to continue follow-up with PCP for further workup of his chronic fatigue at present does not appear to be having any acute infection does not appear to be having any major electrolyte abnormality nor does appear to be having any hypotension or anemia.  3. Bilateral hand pain. Specific complaint of bilateral hand pain in the middle of the night possible carpal tunnel syndrome. Patient was recommended to follow-up with PCP for further workup.  4. Atrial fibrillation. Patient appears to have history of chronic atrial fibrillation not on any anticoagulation due to recurrent GI bleeding. Currently recommended the patient to continue follow-up with his cardiologist as scheduled.  Family Communication: Patient's family was at bedside, all the questions were answered satisfactorily, patient and family both requested to follow-up with PCP this week as well as follow up with their cardiology with next available appointment for further workup.  Thank you very much for involving [place] in care of your patient.  We will sign off at present, please call us again as needed. Patient's care was handed back to ER attending, for discharge.   Author: Berle Mull, MD [facility] Pager: [phone] 06/10/2015 9:24 PM    If 7PM-7AM, please contact  night-coverage [email or web address] Password [email or web address]

## 2015-06-10 NOTE — ED Notes (Addendum)
Pt from home via [facility] with c/o generalized weakness with decreased appetite x 1 week.  Pt states I just haven't felt like eating and that he felt like he was going to "pass out" from being so weak.  On EMS arrival pt was found on kitchen floor but did not fall, denies dizziness upon standing.  Denies any other symptoms or complaints.  Pt in NAD, A&O.

## 2015-06-10 NOTE — Discharge Instructions (Signed)
Syncope °Syncope is a medical term for fainting or passing out. This means you lose consciousness and drop to the ground. People are generally unconscious for less than 5 minutes. You may have some muscle twitches for up to 15 seconds before waking up and returning to normal. Syncope occurs more often in older adults, but it can happen to anyone. While most causes of syncope are not dangerous, syncope can be a sign of a serious medical problem. It is important to seek medical care.  °CAUSES  °Syncope is caused by a sudden drop in blood flow to the brain. The specific cause is often not determined. Factors that can bring on syncope include: °· Taking medicines that lower blood pressure. °· Sudden changes in posture, such as standing up quickly. °· Taking more medicine than prescribed. °· Standing in one place for too long. °· Seizure disorders. °· Dehydration and excessive exposure to heat. °· Low blood sugar (hypoglycemia). °· Straining to have a bowel movement. °· Heart disease, irregular heartbeat, or other circulatory problems. °· Fear, emotional distress, seeing blood, or severe pain. °SYMPTOMS  °Right before fainting, you may: °· Feel dizzy or light-headed. °· Feel nauseous. °· See all white or all black in your field of vision. °· Have cold, clammy skin. °DIAGNOSIS  °Your health care provider will ask about your symptoms, perform a physical exam, and perform an electrocardiogram (ECG) to record the electrical activity of your heart. Your health care provider may also perform other heart or blood tests to determine the cause of your syncope which may include: °· Transthoracic echocardiogram (TTE). During echocardiography, sound waves are used to evaluate how blood flows through your heart. °· Transesophageal echocardiogram (TEE). °· Cardiac monitoring. This allows your health care provider to monitor your heart rate and rhythm in real time. °· Holter monitor. This is a portable device that records your  heartbeat and can help diagnose heart arrhythmias. It allows your health care provider to track your heart activity for several days, if needed. °· Stress tests by exercise or by giving medicine that makes the heart beat faster. °TREATMENT  °In most cases, no treatment is needed. Depending on the cause of your syncope, your health care provider may recommend changing or stopping some of your medicines. °HOME CARE INSTRUCTIONS °· Have someone stay with you until you feel stable. °· Do not drive, use machinery, or play sports until your health care provider says it is okay. °· Keep all follow-up appointments as directed by your health care provider. °· Lie down right away if you start feeling like you might faint. Breathe deeply and steadily. Wait until all the symptoms have passed. °· Drink enough fluids to keep your urine clear or pale yellow. °· If you are taking blood pressure or heart medicine, get up slowly and take several minutes to sit and then stand. This can reduce dizziness. °SEEK IMMEDIATE MEDICAL CARE IF:  °· You have a severe headache. °· You have unusual pain in the chest, abdomen, or back. °· You are bleeding from your mouth or rectum, or you have black or tarry stool. °· You have an irregular or very fast heartbeat. °· You have pain with breathing. °· You have repeated fainting or seizure-like jerking during an episode. °· You faint when sitting or lying down. °· You have confusion. °· You have trouble walking. °· You have severe weakness. °· You have vision problems. °If you fainted, call your local emergency services (911 in [place] Do not drive   yourself to the hospital.  °MAKE SURE YOU: °· Understand these instructions. °· Will watch your condition. °· Will get help right away if you are not doing well or get worse. °Document Released: 08/30/2005 Document Revised: 09/04/2013 Document Reviewed: 10/29/2011 °ExitCare® Patient Information ©2015 [employer]. This information is not intended to replace  advice given to you by your health care provider. Make sure you discuss any questions you have with your health care provider. ° °Vasovagal Syncope, Adult °Syncope, commonly known as fainting, is a temporary loss of consciousness. It occurs when the blood flow to the brain is reduced. Vasovagal syncope (also called neurocardiogenic syncope) is a fainting spell in which the blood flow to the brain is reduced because of a sudden drop in heart rate and blood pressure. Vasovagal syncope occurs when the brain and the cardiovascular system (blood vessels) do not adequately communicate and respond to each other. This is the most common cause of fainting. It often occurs in response to fear or some other type of emotional or physical stress. The body has a reaction in which the heart starts beating too slowly or the blood vessels expand, reducing blood pressure. This type of fainting spell is generally considered harmless. However, injuries can occur if a person takes a sudden fall during a fainting spell.  °CAUSES  °Vasovagal syncope occurs when a person's blood pressure and heart rate decrease suddenly, usually in response to a trigger. Many things and situations can trigger an episode. Some of these include:  °· Pain.   °· Fear.   °· The sight of blood or medical procedures, such as blood being drawn from a vein.   °· Common activities, such as coughing, swallowing, stretching, or going to the bathroom.   °· Emotional stress.   °· Prolonged standing, especially in a warm environment.   °· Lack of sleep or rest.   °· Prolonged lack of food.   °· Prolonged lack of fluids.   °· Recent illness. °· The use of certain drugs that affect blood pressure, such as cocaine, alcohol, marijuana, inhalants, and opiates.   °SYMPTOMS  °Before the fainting episode, you may:  °· Feel dizzy or light headed.   °· Become pale. °· Sense that you are going to faint.   °· Feel like the room is spinning.   °· Have tunnel vision, only seeing  directly in front of you.   °· Feel sick to your stomach (nauseous).   °· See spots or slowly lose vision.   °· Hear ringing in your ears.   °· Have a headache.   °· Feel warm and sweaty.   °· Feel a sensation of pins and needles. °During the fainting spell, you will generally be unconscious for no longer than a couple minutes before waking up and returning to normal. If you get up too quickly before your body can recover, you may faint again. Some twitching or jerky movements may occur during the fainting spell.  °DIAGNOSIS  °Your caregiver will ask about your symptoms, take a medical history, and perform a physical exam. Various tests may be done to rule out other causes of fainting. These may include blood tests and tests to check the heart, such as electrocardiography, echocardiography, and possibly an electrophysiology study. When other causes have been ruled out, a test may be done to check the body's response to changes in position (tilt table test). °TREATMENT  °Most cases of vasovagal syncope do not require treatment. Your caregiver may recommend ways to avoid fainting triggers and may provide home strategies for preventing fainting. If you must be exposed to a   possible trigger, you can drink additional fluids to help reduce your chances of having an episode of vasovagal syncope. If you have warning signs of an oncoming episode, you can respond by positioning yourself favorably (lying down). °If your fainting spells continue, you may be given medicines to prevent fainting. Some medicines may help make you more resistant to repeated episodes of vasovagal syncope. Special exercises or compression stockings may be recommended. In rare cases, the surgical placement of a pacemaker is considered. °HOME CARE INSTRUCTIONS  °· Learn to identify the warning signs of vasovagal syncope.   °· Sit or lie down at the first warning sign of a fainting spell. If sitting, put your head down between your legs. If you lie down,  swing your legs up in the air to increase blood flow to the brain.   °· Avoid hot tubs and saunas. °· Avoid prolonged standing. °· Drink enough fluids to keep your urine clear or pale yellow. Avoid caffeine. °· Increase salt in your diet as directed by your caregiver.   °· If you have to stand for a long time, perform movements such as:   °¨ Crossing your legs.   °¨ Flexing and stretching your leg muscles.   °¨ Squatting.   °¨ Moving your legs.   °¨ Bending over.   °· Only take over-the-counter or prescription medicines as directed by your caregiver. Do not suddenly stop any medicines without asking your caregiver first.  °SEEK MEDICAL CARE IF:  °· Your fainting spells continue or happen more frequently in spite of treatment.   °· You lose consciousness for more than a couple minutes. °· You have fainting spells during or after exercising or after being startled.   °· You have new symptoms that occur with the fainting spells, such as:   °¨ Shortness of breath. °¨ Chest pain.   °¨ Irregular heartbeat.   °· You have episodes of twitching or jerky movements that last longer than a few seconds. °· You have episodes of twitching or jerky movements without obvious fainting. °SEEK IMMEDIATE MEDICAL CARE IF:  °· You have injuries or bleeding after a fainting spell.   °· You have episodes of twitching or jerky movements that last longer than 5 minutes.   °· You have more than one spell of twitching or jerky movements before returning to consciousness after fainting. °MAKE SURE YOU:  °· Understand these instructions. °· Will watch your condition. °· Will get help right away if you are not doing well or get worse. °Document Released: 08/16/2012 Document Reviewed: 08/16/2012 °ExitCare® Patient Information ©2015 [employer]. This information is not intended to replace advice given to you by your health care provider. Make sure you discuss any questions you have with your health care provider. ° °

## 2015-06-10 NOTE — ED Provider Notes (Addendum)
CSN: [id number]     Arrival date & time 06/10/15  1451 History   None    Chief Complaint  Patient presents with  . Weakness  . Anorexia   Patient is a 74 y.o. male presenting with weakness.  Weakness This is a new problem. The current episode started in the past 7 days. The problem occurs constantly. The problem has been unchanged. Associated symptoms include fatigue, nausea and weakness. Pertinent negatives include no abdominal pain, anorexia, arthralgias, chest pain, chills, congestion, coughing, diaphoresis, fever, headaches, joint swelling, myalgias, neck pain, numbness, rash, sore throat, swollen glands, urinary symptoms, vertigo, visual change or vomiting.   Past Medical History  Diagnosis Date  . CAD (coronary artery disease)   . Acute non-ST segment elevation myocardial infarction   . Left renal mass   . Diverticulitis   . HTN (hypertension)   . Myocardial infarction 06/19/2011  . Ulcer 1960's    "stomach"  . Blood transfusion     "related to surgeries"  . Diverticulitis 1996  . CHF (congestive heart failure)   . High cholesterol   . Arthritis     "hands" (09/16/2014)  . COPD (chronic obstructive pulmonary disease)   . Type II diabetes mellitus     type II  . Renal neoplasm     LEFT  . Dysrhythmia     A. Fib  . Cancer     kidney   Past Surgical History  Procedure Laterality Date  . Coronary artery bypass graft  06/2011    4 VESSELS  . Colon resection  2005    FOR DIVERTICULITIS  . Robot assisted laparoscopic nephrectomy  11/29/2011    Procedure: ROBOTIC ASSISTED LAPAROSCOPIC NEPHRECTOMY;  Surgeon: Dutch Gray, MD;  Location: [facility];  Service: Urology;  Laterality: Left;  PARTIAL left nephrectomy    . Cardioversion  01/11/2012    Procedure: CARDIOVERSION;  Surgeon: Laverda Page, MD;  Location: [place];  Service: Cardiovascular;  Laterality: N/A;  . Colonoscopy  02/02/2012    Procedure: COLONOSCOPY;  Surgeon: Jeryl Columbia, MD;  Location: [facility];  Service:  Endoscopy;  Laterality: N/A;  magod /ja  . Hernia repair Bilateral X 4    "twice on each side"  . Colon surgery    . Cataract extraction w/ intraocular lens  implant, bilateral Bilateral   . Appendectomy    . Exploratory laparotomy  04/08/2015  . Cholecystectomy  04/08/2015  . Laparoscopic lysis of adhesions  04/08/2015  . Laparotomy N/A 04/08/2015    Procedure: EXPLORATORY LAPAROTOMY, REPAIR OF ENTEROTOMY;  Surgeon: Armandina Gemma, MD;  Location: [place];  Service: General;  Laterality: N/A;  . Lysis of adhesion  04/08/2015    Procedure: LYSIS OF ADHESION;  Surgeon: Armandina Gemma, MD;  Location: [place];  Service: General;;  . Cholecystectomy  04/08/2015    Procedure: CHOLECYSTECTOMY;  Surgeon: Armandina Gemma, MD;  Location: [facility];  Service: General;;   Family History  Problem Relation Age of Onset  . Heart disease Father   . Alzheimer's disease Mother   . Alcohol abuse Brother   . Cancer Sister    Social History  Substance Use Topics  . Smoking status: Current Every Day Smoker -- 0.50 packs/day for 52 years    Types: Cigarettes  . Smokeless tobacco: Never Used  . Alcohol Use: No    Review of Systems  Constitutional: Positive for fatigue. Negative for fever, chills and diaphoresis.  HENT: Negative for congestion and sore throat.  Respiratory: Negative for cough.   Cardiovascular: Negative for chest pain.  Gastrointestinal: Positive for nausea. Negative for vomiting, abdominal pain and anorexia.  Musculoskeletal: Negative for myalgias, joint swelling, arthralgias and neck pain.  Skin: Negative for rash.  Neurological: Positive for weakness. Negative for vertigo, numbness and headaches.  All other systems reviewed and are negative.  Allergies  Review of patient's allergies indicates no known allergies.  Home Medications   Prior to Admission medications   Medication Sig Start Date End Date Taking? Authorizing Provider  albuterol (PROVENTIL HFA;VENTOLIN HFA) 108 (90 BASE) MCG/ACT  inhaler Inhale 1-2 puffs into the lungs every 4 (four) hours as needed for wheezing or shortness of breath.   Yes Historical Provider, MD  aspirin EC 81 MG tablet Take 81 mg by mouth 2 (two) times daily.   Yes Historical Provider, MD  Carboxymethylcell-Hypromellose (GENTEAL OP) Place 1 drop into both eyes 2 (two) times daily.    Yes Historical Provider, MD  digoxin (LANOXIN) 0.125 MG tablet Take 0.125 mg by mouth daily.   Yes Historical Provider, MD  fenofibrate (TRICOR) 145 MG tablet Take 145 mg by mouth daily after breakfast.    Yes Historical Provider, MD  fluticasone (FLOVENT HFA) 110 MCG/ACT inhaler Inhale 1 puff into the lungs 2 (two) times daily. 08/30/14  Yes Bonnielee Haff, MD  ketoconazole (NIZORAL) 2 % cream Apply 1 application topically daily. Apply to rash on feet   Yes Historical Provider, MD  lisinopril (PRINIVIL,ZESTRIL) 2.5 MG tablet Take 2.5 mg by mouth daily after breakfast.    Yes Historical Provider, MD  metFORMIN (GLUCOPHAGE) 1000 MG tablet Take 1,000 mg by mouth 2 (two) times daily with a meal.   Yes Historical Provider, MD  metoprolol (LOPRESSOR) 50 MG tablet Take 50 mg by mouth 2 (two) times daily.  12/27/13  Yes Historical Provider, MD  nitroGLYCERIN (NITROSTAT) 0.4 MG SL tablet Place 0.4 mg under the tongue every 5 (five) minutes x 3 doses as needed for chest pain. For chest pain   Yes Historical Provider, MD  pravastatin (PRAVACHOL) 40 MG tablet Take 40 mg by mouth at bedtime.  11/27/13  Yes Historical Provider, MD  spironolactone (ALDACTONE) 50 MG tablet Take 50 mg by mouth daily.   Yes Historical Provider, MD  traMADol (ULTRAM) 50 MG tablet Take 50 mg by mouth every 8 (eight) hours as needed for severe pain.  01/02/14  Yes Historical Provider, MD   BP 119/73 mmHg  Pulse 71  Temp(Src) 97.7 F (36.5 C) (Oral)  Resp 24  SpO2 96% Physical Exam  Constitutional: He appears well-developed and well-nourished. No distress.  HENT:  Head: Normocephalic and atraumatic.  Eyes:  Conjunctivae are normal. Pupils are equal, round, and reactive to light.  Neck: Normal range of motion. Neck supple.  Cardiovascular: Normal rate.   Pulmonary/Chest: No respiratory distress. He has no wheezes.  Decreased inspiratory effort  Abdominal: He exhibits no distension.  Healed midline scar  Neurological:  Globally weak without neurological deficit.   No asterixis noted.   Normal finger to nose.   Skin: He is not diaphoretic.  Nursing note and vitals reviewed.  ED Course  Procedures (including critical care time) Labs Review Labs Reviewed  CBC - Abnormal; Notable for the following:    Hemoglobin 17.4 (*)    RDW 15.8 (*)    Platelets 124 (*)    All other components within normal limits  COMPREHENSIVE METABOLIC PANEL - Abnormal; Notable for the following:    Sodium 134 (*)  CO2 19 (*)    Glucose, Bld 126 (*)    Alkaline Phosphatase 37 (*)    All other components within normal limits  BRAIN NATRIURETIC PEPTIDE - Abnormal; Notable for the following:    B Natriuretic Peptide 122.7 (*)    All other components within normal limits  DIGOXIN LEVEL - Abnormal; Notable for the following:    Digoxin Level 0.2 (*)    All other components within normal limits  CBG MONITORING, ED - Abnormal; Notable for the following:    Glucose-Capillary 127 (*)    All other components within normal limits  I-STAT CG4 LACTIC ACID, ED - Abnormal; Notable for the following:    Lactic Acid, Venous 2.29 (*)    All other components within normal limits  URINALYSIS, ROUTINE W REFLEX MICROSCOPIC (NOT AT [facility])  I-STAT TROPOININ, ED  I-STAT CG4 LACTIC ACID, ED   Imaging Review Dg Chest 2 View  06/10/2015   CLINICAL DATA:  Shortness of breath and weakness  EXAM: CHEST  2 VIEW  COMPARISON:  April 11, 2015  FINDINGS: Chronic interstitial thickening is stable. There is no frank edema or consolidation. Heart size and pulmonary vascularity are normal. No adenopathy. Patient is status post coronary artery  bypass grafting. No bone lesions are appreciable.  IMPRESSION: Chronic interstitial thickening is stable. There is no frank edema or consolidation. No change in cardiac silhouette.   Electronically Signed   By: Lowella Grip III M.D.   On: 06/10/2015 17:26   I have personally reviewed and evaluated these images and lab results as part of my medical decision-making.   EKG Interpretation   Date/Time:  Tuesday June 10 2015 15:06:54 EDT Ventricular Rate:  80 PR Interval:    QRS Duration: 161 QT Interval:  414 QTC Calculation: 478 R Axis:   99 Text Interpretation:  Atrial fibrillation RBBB and LPFB Atrial  fibrillation Left bundle branch block No significant change since last  tracing Confirmed by Gerald Leitz ([id number]) on 06/10/2015 3:26:00 PM     MDM   Mr. Schubring with pmh of coronary artery disease status post CABG as well as congestive heart failure COPD presents emergency room today with generalized weakness. Patient has been having 7 days of generalized weakness is grossly gotten worse and had a near syncopal episode today the home. Patient did not lose consciousness however was shaking per his daughter. He had no incontinence, no tongue biting, no history of seizures and was talking to patient earlier. Patient has no other neurological deficits. Patient has no chest pain no shortness of breath with exertion. He denies any rectal bleeding. Patient has no neurological deficit.   Patient has lactic acidosis. Possible dehydration but according to [facility] Syncope Rule, patient is not low risk for syncope and given global weakness, history of congestive heart failure ,history of coronary artery disease, patient will need further management with admission to the hospital. Consult to hospitalist placed.   Final diagnoses:  Syncope and collapse  History of CHF (congestive heart failure)  History of coronary artery disease  Atrial fibrillation, unspecified    Dr.Patel from IM  saw patietn and felt he was safe for discharge.   Roberto Scales, MD 06/10/15 1957  Selwyn Reason Julio Alm, MD 06/10/15 2001  [place], MD 06/10/15 2156

## 2015-06-10 NOTE — ED Notes (Signed)
Pt grew impatient and wanted to be discharged. MD busy in trauma room. Pt to leave before discharge papers given .

## 2015-06-10 NOTE — ED Notes (Signed)
Ambulated pt around Pod B with no assistance he did a great job stated he had no dizziness, light headedness, etc

## 2015-06-10 NOTE — ED Notes (Signed)
Pt leaving before discharge papers given. Pt grew impatient when MD was busy in trauma room.

## 2015-07-17 ENCOUNTER — Other Ambulatory Visit: Payer: Self-pay | Admitting: Family Medicine

## 2015-07-17 DIAGNOSIS — R413 Other amnesia: Secondary | ICD-10-CM

## 2015-07-22 ENCOUNTER — Ambulatory Visit
Admission: RE | Admit: 2015-07-22 | Discharge: 2015-07-22 | Disposition: A | Discharge status: Home / Self Care | Payer: Medicare Other | Source: Ambulatory Visit | Attending: Family Medicine | Admitting: Family Medicine

## 2015-07-22 DIAGNOSIS — R413 Other amnesia: Secondary | ICD-10-CM

## 2015-07-22 MED ORDER — GADOBENATE DIMEGLUMINE 529 MG/ML IV SOLN
19.0000 mL | Freq: Once | INTRAVENOUS | Status: AC | PRN
  Administered 2015-07-22: 19 mL via INTRAVENOUS

## 2015-10-17 DIAGNOSIS — E7800 Pure hypercholesterolemia, unspecified: Secondary | ICD-10-CM | POA: Diagnosis not present

## 2015-10-17 DIAGNOSIS — I2510 Atherosclerotic heart disease of native coronary artery without angina pectoris: Secondary | ICD-10-CM | POA: Diagnosis not present

## 2015-10-17 DIAGNOSIS — E1149 Type 2 diabetes mellitus with other diabetic neurological complication: Secondary | ICD-10-CM | POA: Diagnosis not present

## 2015-10-17 DIAGNOSIS — Z7984 Long term (current) use of oral hypoglycemic drugs: Secondary | ICD-10-CM | POA: Diagnosis not present

## 2015-10-17 DIAGNOSIS — M1990 Unspecified osteoarthritis, unspecified site: Secondary | ICD-10-CM | POA: Diagnosis not present

## 2015-10-17 DIAGNOSIS — F320 Major depressive disorder, single episode, mild: Secondary | ICD-10-CM | POA: Diagnosis not present

## 2015-10-17 DIAGNOSIS — I509 Heart failure, unspecified: Secondary | ICD-10-CM | POA: Diagnosis not present

## 2015-10-17 DIAGNOSIS — R5383 Other fatigue: Secondary | ICD-10-CM | POA: Diagnosis not present

## 2015-12-29 DIAGNOSIS — I482 Chronic atrial fibrillation: Secondary | ICD-10-CM | POA: Diagnosis not present

## 2015-12-29 DIAGNOSIS — I5032 Chronic diastolic (congestive) heart failure: Secondary | ICD-10-CM | POA: Diagnosis not present

## 2015-12-29 DIAGNOSIS — I2781 Cor pulmonale (chronic): Secondary | ICD-10-CM | POA: Diagnosis not present

## 2015-12-29 DIAGNOSIS — R600 Localized edema: Secondary | ICD-10-CM | POA: Diagnosis not present

## 2016-01-01 DIAGNOSIS — I482 Chronic atrial fibrillation: Secondary | ICD-10-CM | POA: Diagnosis not present

## 2016-01-01 DIAGNOSIS — I5032 Chronic diastolic (congestive) heart failure: Secondary | ICD-10-CM | POA: Diagnosis not present

## 2016-01-01 DIAGNOSIS — R600 Localized edema: Secondary | ICD-10-CM | POA: Diagnosis not present

## 2016-01-09 DIAGNOSIS — I10 Essential (primary) hypertension: Secondary | ICD-10-CM | POA: Diagnosis not present

## 2016-01-09 DIAGNOSIS — I2510 Atherosclerotic heart disease of native coronary artery without angina pectoris: Secondary | ICD-10-CM | POA: Diagnosis not present

## 2016-01-09 DIAGNOSIS — I5032 Chronic diastolic (congestive) heart failure: Secondary | ICD-10-CM | POA: Diagnosis not present

## 2016-01-12 DIAGNOSIS — I10 Essential (primary) hypertension: Secondary | ICD-10-CM | POA: Diagnosis not present

## 2016-01-14 IMAGING — MR MR HEAD WO/W CM
11 of 12 series · 45 of 48 positions shown · IV contrast (19ml multihance)
Comparison: 08/29/2014 MR.

CLINICAL DATA: 74-year-old hypertensive diabetic male with memory
loss and syncopal episode causing patient to fall. Initial
encounter.

EXAM:
MRI HEAD WITHOUT AND WITH CONTRAST
TECHNIQUE: Multiplanar, multiecho pulse sequences of the brain and surrounding
structures were obtained without and with intravenous contrast.
CONTRAST:  19mL MULTIHANCE GADOBENATE DIMEGLUMINE 529 MG/ML IV SOLN

[Series 2: T1 · sagittal · 5.0mm · 0.45mm/px · 2 of 21 slices shown]
[im 1/21]
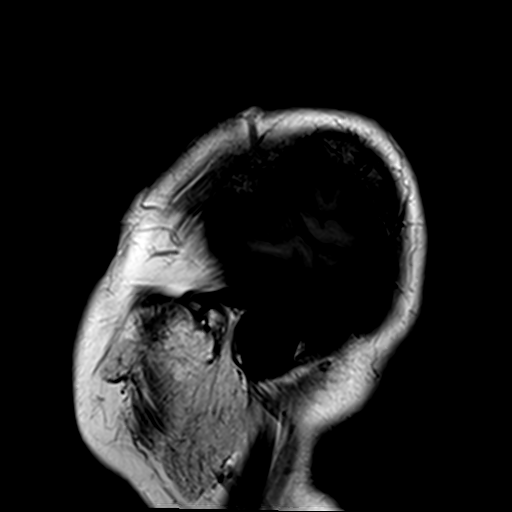
[im 21/21]
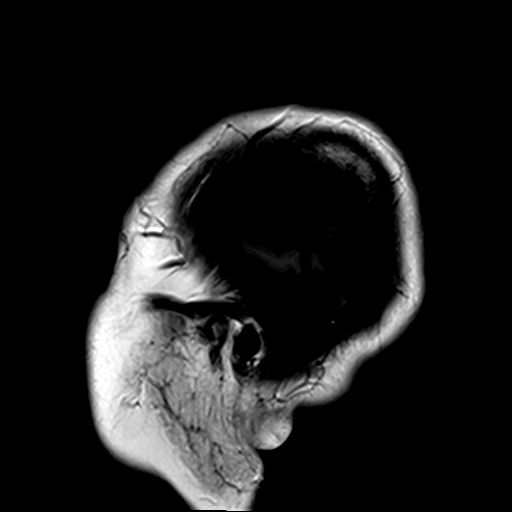

[Series 3: DWI · axial · 3.0mm · 1.80mm/px · z∈[-74,+69]mm · 8 of 100 slices shown (1 of 4)]
[im 1/100]
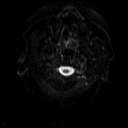
[im 15/100]
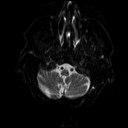
[im 29/100]
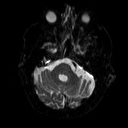
[im 43/100]
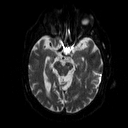
[im 57/100]
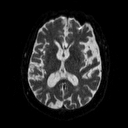
[im 71/100]
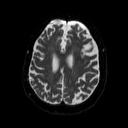
[im 85/100]
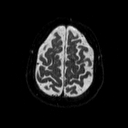
[im 100/100]
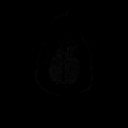

[Series 4: DWI · axial · 3.0mm · 1.80mm/px · z∈[-74,+69]mm · 4 of 50 slices shown (2 of 4)]
[im 1/50]
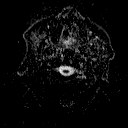
[im 17/50]
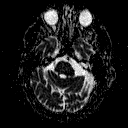
[im 33/50]
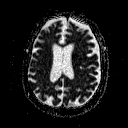
[im 50/50]
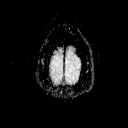

[Series 6: swi_images · axial · 2.0mm · 0.90mm/px · z∈[-79,+75]mm · 6 of 80 slices shown]
[im 1/80]
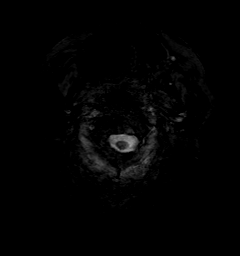
[im 16/80]
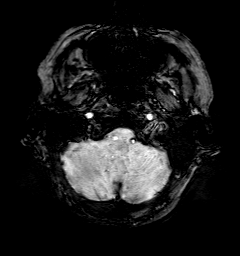
[im 32/80]
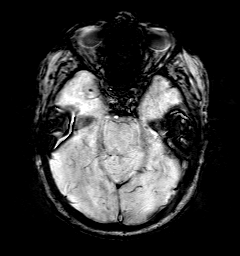
[im 48/80]
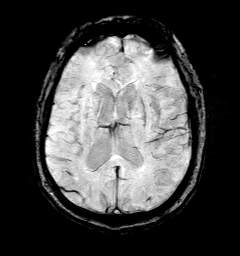
[im 64/80]
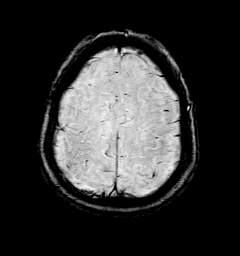
[im 80/80]
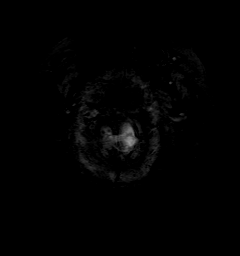

[Series 7: DWI · coronal · 5.0mm · 1.80mm/px · 5 of 68 slices shown (3 of 4)]
[im 1/68]
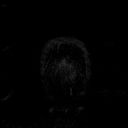
[im 17/68]
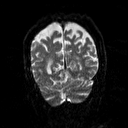
[im 34/68]
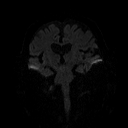
[im 51/68]
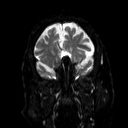
[im 68/68]
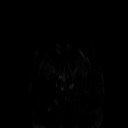

[Series 8: DWI · coronal · 5.0mm · 1.80mm/px · 3 of 34 slices shown (4 of 4)]
[im 1/34]
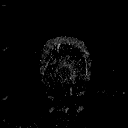
[im 17/34]
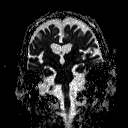
[im 34/34]
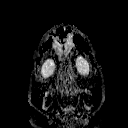

[Series 9: T2 · axial · 5.0mm · 0.51mm/px · z∈[-70,+68]mm · 2 of 22 slices shown (1 of 2)]
[im 1/22]
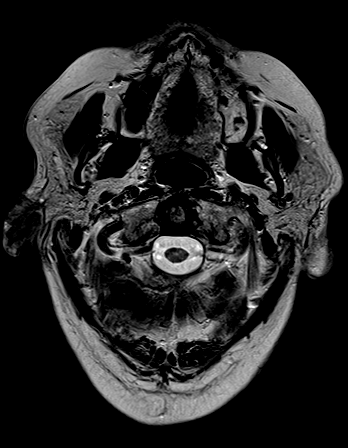
[im 22/22]
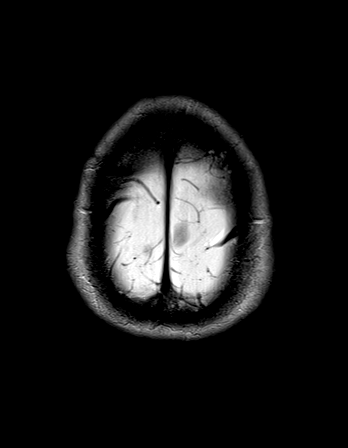

[Series 10: FLAIR · axial · 5.0mm · 0.45mm/px · z∈[-71,+67]mm · 2 of 22 slices shown]
[im 1/22]
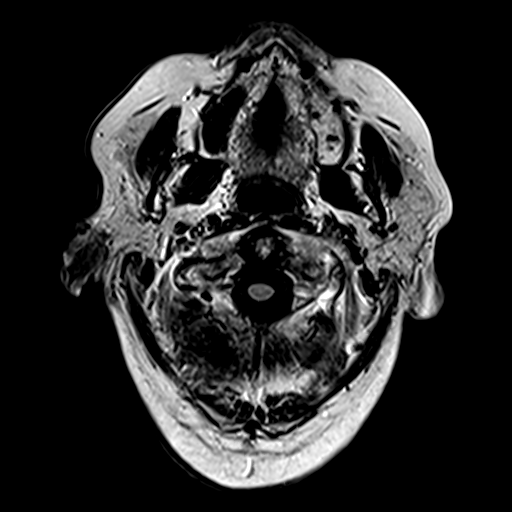
[im 22/22]
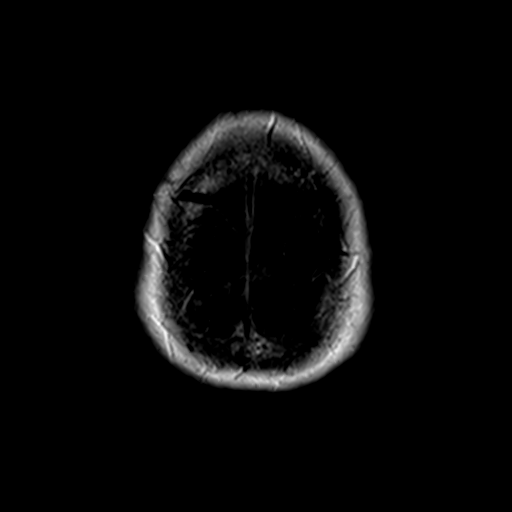

[Series 11: t1_mpr_tra · axial · 2.0mm · 0.45mm/px · z∈[-78,+76]mm · 6 of 80 slices shown (1 of 2)]
[im 1/80]
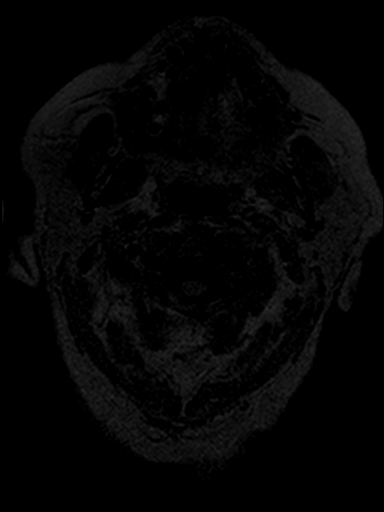
[im 16/80]
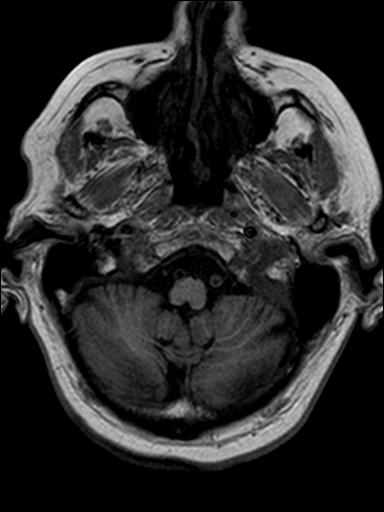
[im 32/80]
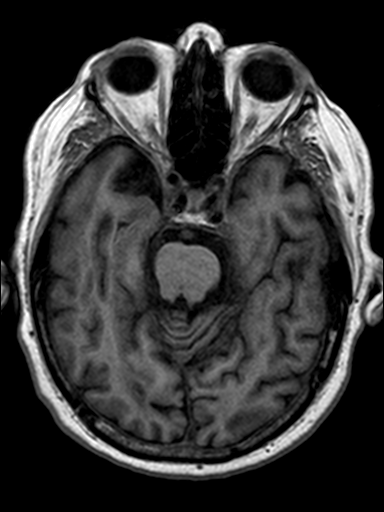
[im 48/80]
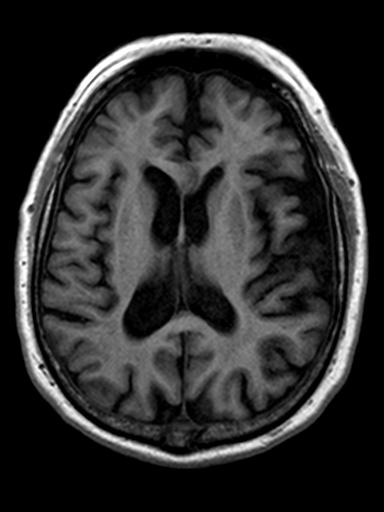
[im 64/80]
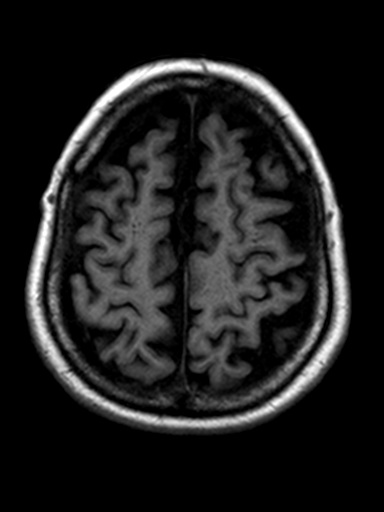
[im 80/80]
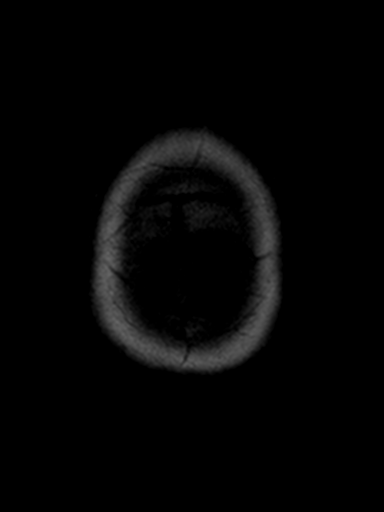

[Series 12: T2 · coronal · 5.0mm · 0.45mm/px · 2 of 25 slices shown (2 of 2)]
[im 1/25]
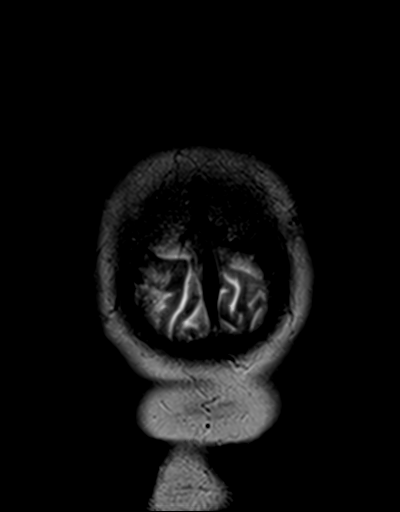
[im 25/25]
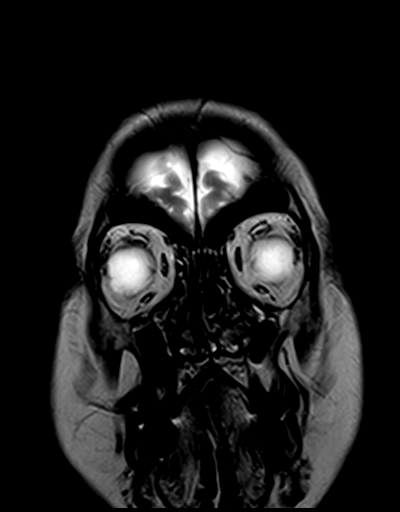

[Series 13: t1_mpr_tra · axial · 2.0mm · 0.45mm/px · z∈[-78,+45]mm · 5 of 80 slices shown (2 of 2)]
[im 1/80]
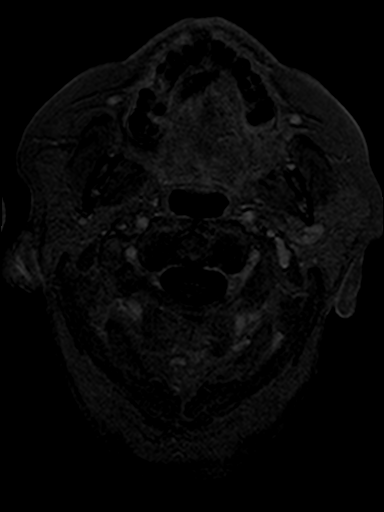
[im 16/80]
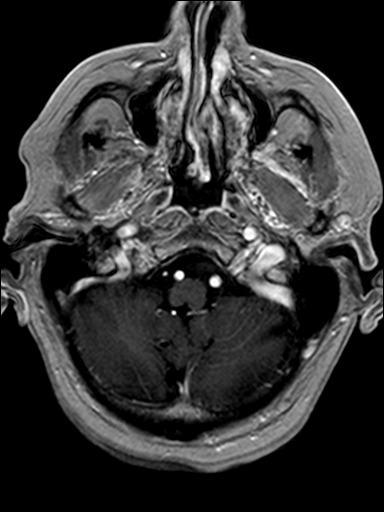
[im 32/80]
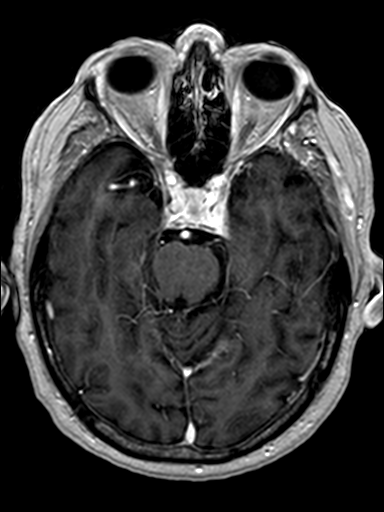
[im 48/80]
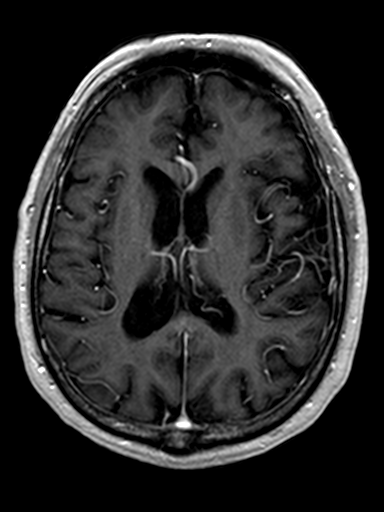
[im 64/80]
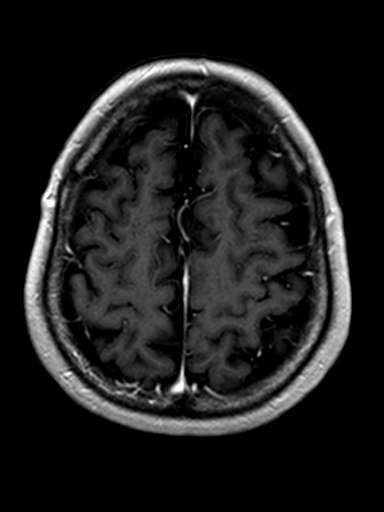

[45 of 48 positions shown; findings below may reference images not displayed]

FINDINGS: No acute infarct.

No intracranial hemorrhage.

Global atrophy without hydrocephalus.

Mild small vessel disease type changes.

No intracranial mass or abnormal enhancement

Atherosclerotic type changes left vertebral artery with narrowing.
Ectatic vertebral arteries and basilar artery. Internal carotid
arteries are patent.

C3-4 mild spinal stenosis. Cervical medullary junction unremarkable.

Post lens replacement otherwise orbital structures unremarkable.

Pituitary and pineal region within normal limits.

Minimal mucosal thickening paranasal sinuses.
IMPRESSION: No acute infarct.

No intracranial hemorrhage.

Global atrophy without hydrocephalus.

Mild small vessel disease type changes.

No intracranial mass or abnormal enhancement

Atherosclerotic type changes left vertebral artery with narrowing.

C3-4 mild spinal stenosis.

## 2016-02-04 ENCOUNTER — Other Ambulatory Visit (HOSPITAL_COMMUNITY): Payer: Self-pay | Admitting: Urology

## 2016-02-04 ENCOUNTER — Ambulatory Visit (HOSPITAL_COMMUNITY)
Admission: RE | Admit: 2016-02-04 | Discharge: 2016-02-04 | Disposition: A | Discharge status: Home / Self Care | Payer: Medicare Other | Source: Ambulatory Visit | Attending: Urology | Admitting: Urology

## 2016-02-04 DIAGNOSIS — F17210 Nicotine dependence, cigarettes, uncomplicated: Secondary | ICD-10-CM | POA: Diagnosis not present

## 2016-02-04 DIAGNOSIS — J449 Chronic obstructive pulmonary disease, unspecified: Secondary | ICD-10-CM | POA: Insufficient documentation

## 2016-02-04 DIAGNOSIS — I2781 Cor pulmonale (chronic): Secondary | ICD-10-CM | POA: Diagnosis not present

## 2016-02-04 DIAGNOSIS — R600 Localized edema: Secondary | ICD-10-CM | POA: Diagnosis not present

## 2016-02-04 DIAGNOSIS — C642 Malignant neoplasm of left kidney, except renal pelvis: Secondary | ICD-10-CM

## 2016-02-04 DIAGNOSIS — Z87891 Personal history of nicotine dependence: Secondary | ICD-10-CM | POA: Diagnosis not present

## 2016-02-04 DIAGNOSIS — I482 Chronic atrial fibrillation: Secondary | ICD-10-CM | POA: Diagnosis not present

## 2016-02-04 DIAGNOSIS — R918 Other nonspecific abnormal finding of lung field: Secondary | ICD-10-CM | POA: Diagnosis not present

## 2016-02-04 DIAGNOSIS — I5042 Chronic combined systolic (congestive) and diastolic (congestive) heart failure: Secondary | ICD-10-CM | POA: Diagnosis not present

## 2016-02-04 DIAGNOSIS — Z0000 Encounter for general adult medical examination without abnormal findings: Secondary | ICD-10-CM | POA: Diagnosis not present

## 2016-02-19 DIAGNOSIS — I2510 Atherosclerotic heart disease of native coronary artery without angina pectoris: Secondary | ICD-10-CM | POA: Diagnosis not present

## 2016-02-22 NOTE — H&P (Addendum)
OFFICE VISIT NOTES COPIED TO EPIC FOR DOCUMENTATION  . History of Present Illness Alan Page MD; 02/04/2016 5:51 PM) Patient words: Last O/V 12/29/15; 4-6 week F/U for edema and echo.  The patient is a 75 year old male who presents for a follow-up for Congestive heart failure.  Additional reasons for visit:  Follow-up for Atrial fibrillation is described as the following: Patient is a Caucasian male with history of known coronary artery disease and now permanent atrial fibrillation presents here for 6 month follow-up. He denies any palpitations, chest pain, change in shortness of breath, PND or orthopnea. Unfortunately continues to smoke. He could not tolerate any anticoagulation due to recurrent GI bleed and was recommended aspirin only by his gastroenterologist Dr. Lizbeth Gordon. He was hospitalized in May 2015 for bleeding diverticula. This has since resolved and he denies any GI bleeding since discharge from hospital. He is accompanied by his daughter at the bedside.  He was seen in her office about a month ago for worsening shortness of breath and also leg edema. I started him back on furosemide, he now presents here for follow-up. I'll set him up for repeat echocardiogram. He is accompanied by his daughter at the bedside. Since making changes in the medications especially adding furosemide, his leg edema has completely resolved. He states that his dyspnea is also improved. Presents here for follow-up. No PND or orthopnea.   Problem List/Past Medical [name]; 02/04/2016 10:03 AM) Chronic atrial fibrillation (I48.2)  CHA2DS2-VASc Score is 6 with yearly risk of stroke of 9.8%. 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 Patient's choice. Low back ache (M54.5)  Atherosclerosis of native coronary artery of native heart without angina pectoris  (I25.10)  CAD/ASHD H/O NSTEMI S/P CABGs on 06/28/2011. LIMA to LAD, SVG to circumflex, sequential SVG to PDA and PL of RCA. Alan Mons, MD Lexiscan stress 01/17/13; 1. Resting EKG shows A. Fibrillation with RVR @ 107/min. RBBB. Non specific ST-T change. PVC. Stress EKG was non diagnostic for ischemia. No ST-T changes of ischemia noted with pharmacologic stress testing. 2. The perfusion study demonstrated a large sized inferior and inferolateral scar from the base to the apex. There was no significant peri-infarct ischemia. Left ventricular ejection fraction was estimated to be 19%. However the EF estimation may be erroneous due to difficulty in QGS gating due to A. Fibrillation. This represents a low risk study. Clinical correlation recommended. Labwork  01/12/2016: Glucose 169 (nonfasting), creatinine 0.9, potassium 4.9, PLT 136, CBC otherwise normal 10/17/2015: HbA1c 6.0%. Serum glucose 90 mg. BUN 19, serum creatinine 1.26, eGFR 56 mL. Potassium 4.5. CMP otherwise normal. Total cholesterol 120, triglycerides 210, HDL 26, LDL 52. Non-HDL cholesterol 94. HbA1c 6.7%. BUN 21, serum. 1.26, eGFR 56 mL. CMP otherwise normal. 08/26/2014: BUN 17, serum creatinine 1.11. EGFR 64 mL. Sodium 132, minimally reduced. BNP was mildly elevated at 254. 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 LIMA to LAD, SVG to circumflex, sequential SVG to PDA and PL of RCA. 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 This exam reveals mildly decreased perfusion of the right lower extremity, noted at the post tibial artery  level.This exam reveals mildly decreased perfusion of the left lower extremity, noted at the post tibial  artery level. LABI 0.88 and RABI 0.95. Study suggests mild diffuse disease. Angina pectoris (I20.9)  Lexiscan myoview stress test 01/09/2016: 1. The resting electrocardiogram demonstrated A. fibrillation, RBBB. Stress EKG is non-diagnostic for ischemia as it a pharmacologic stress using Lexiscan. Occasional PVC noted. Stress symptoms included dyspnea. 2. SPECT images demonstrate large perfusion abnormality of severe intensity in the basal inferior, basal inferolateral, mid inferior, mid inferolateral, apical inferior, apical lateral and lateral myocardial wall(s) consistent with prior myocardial infarction.There is very minimal peri-infarct ischmia. The left ventricular ejection fraction was markedly depressed and calculated to be 31%. This is a high risk study,in view of decreased EF. No significant change from 01/17/2013. Other hypervolemia (E87.79)  Cor pulmonale (chronic) (I27.81)  COPD and echo revealing RV dilatation. Chronic diastolic heart failure, NYHA class 2 (I50.32)  Echocardiogram 01/01/2016: Left ventricle cavity is normal in size. Mild to moderate concentric hypertrophy of the left ventricle. Mild to moderate generalized hypokinesis, inferior akinesis. Mid to distal lateral hypokinesis. Left ventricular systolic function markedly rest. Visual LVEF 25-30%. Calculated EF 24%. Unable to evaluate diastolic function due to A. Fibrillation. Left atrial cavity is severely dilated. Right atrial cavity is slightly dilated. Right ventricle cavity is mild to moderately dilated. Moderately reduced right ventricular function. Mild aortic regurgitation. Mild mitral regurgitation. Mild to moderate tricuspid regurgitation. Moderate pulmonary hypertension. Pulmonary artery systolic pressure is estimated at 45 mm Hg. IVC is dilated with a respiratory response of <50%. COmpared to the study done on 10/11/2013, EF decreased from 45%. Bilateral leg edema (R60.0)  History of GI diverticular bleed (Z87.19)  01/13/2014 01/13/2014: Needed blood transfusion.  Allergies ([name]; February 28, 2016 10:03 AM) No Known Drug Allergies 07/13/2012  Family History [name]; Feb 28, 2016 10:03 AM) Mother  Deceased. at 18, from Alzheimer's Father  Deceased. at age 110, from unknown cause; Hx of Heart Conditions Siblings  was 8; 5 living  Social History [name]; 2016-02-28 10:03 AM) Current tobacco use  Current every day smoker. 1/2 pack daily (previously quit in 2012) Non Drinker/No Alcohol Use  Marital status  Married. Number of Children  3. Living Situation  Lives with spouse.  Past Surgical History [name]; 2016-02-28 10:03 AM) Left renal mass h/o Left nephrectomy 11/29/11.  Non ST-segment elevation myocardial infarction 06/19/11. 2. Coronary artery disease S/P CABGs on 06/28/2011. LIMA to LAD, SVG to circumflex, sequential SVG to PDA and PL of RCA. Alan Mons, MD   Medication History [name]; 02-28-2016 10:07 AM) Pravastatin Sodium (40MG Tablet, 1 (one) Tablet Oral in the evening after dinner, Taken starting 12/29/2015) Active. Furosemide (40MG Tablet, 1 (one) Tablet Oral daily as needed for leg edema, Taken starting 12/29/2015) Active. Spironolactone (25MG Tablet, 1 Tablet Tablet Oral every morning, Taken starting 12/05/2015) Active. Digoxin (125MCG Tablet, 1 Tablet Tablet Oral daily, Taken starting 07/24/2015) Active. Metoprolol Tartrate (25MG Tablet, 1 Tablet Tablet Oral two times daily, Taken starting 06/05/2015) Active. Lisinopril (2.5MG Tablet, 1 Tablet Tablet Oral daily, Taken starting 05/01/2015) Active. Nitrostat (0.4MG Tab Sublingual, 1 tablet under the tongue Tab Sublingual as needed for chest pain every 5 min up to 3 times, Taken starting 04/24/2015) Active. TraMADol HCl (50MG Tablet, 1 (one) Tablet Tablet Oral three times daily, as needed, Taken starting 10/17/2013) Active. Tricor (145MG Tablet, 1 Tablet Tablet Oral daily, Taken  starting 07/26/2012) Active. Gen Teal opthalmic solution (1 drop each eye two times daily) Active. Aspirin (81MG Tablet, 1 Oral  two times daily) Active. MetFORMIN HCl ER (OSM) (1000MG Tablet ER 24HR, 1 Oral two times daily) Active. Triamcinolone Acetonide (Top) (0.1% Cream, apply External daily) Active. ProAir HFA (108 (90 Base)MCG/ACT Aerosol Soln, 2 puffs Inhalation every 4 hrs as needed) Active. Flovent HFA (110MCG/ACT Aerosol, 1 puff Inhalation two times daily) Active. Ketoconazole (Topical) (2% Cream, External apply as needed) Active. Sertraline HCl (50MG Tablet, 1 Oral daily) Active. Montelukast Sodium (10MG Tablet, 1 Oral daily) Active. Vitamin C (500MG Tablet, 1 Oral daily) Active. ZyrTEC Allergy (10MG Tablet, 1 Oral daily) Active. Medications Reconciled (List present)  Diagnostic Studies History [name]; 02/04/2016 10:04 AM) Echocardiogram 01/01/2016 Left ventricle cavity is normal in size. Mild to moderate concentric hypertrophy of the left ventricle. Mild to moderate generalized hypokinesis, inferior akinesis. Mid to distal lateral hypokinesis. Left ventricular systolic function markedly rest. Visual LVEF 25-30%. Calculated EF 24%. Unable to evaluate diastolic function due to A. Fibrillation. Left atrial cavity is severely dilated. Right atrial cavity is slightly dilated. Right ventricle cavity is mild to moderately dilated. Moderately reduced right ventricular function. Mild aortic regurgitation. Mild mitral regurgitation. Mild to moderate tricuspid regurgitation. Moderate pulmonary hypertension. Pulmonary artery systolic pressure is estimated at 45 mm Hg. IVC is dilated with a respiratory response of <50%. COmpared to the study done on 10/11/2013, EF decreased from 45%. Nuclear stress test 01/09/2016 1. The resting electrocardiogram demonstrated A. fibrillation, RBBB. Stress EKG is non-diagnostic for ischemia as it a pharmacologic stress using Lexiscan. Occasional  PVC noted. Stress symptoms included dyspnea. 2. SPECT images demonstrate large perfusion abnormality of severe intensity in the basal inferior, basal inferolateral, mid inferior, mid inferolateral, apical inferior, apical lateral and lateral myocardial wall(s) consistent with prior myocardial infarction.There is very minimal peri-infarct ischmia. The left ventricular ejection fraction was markedly depressed and calculated to be 31%. This is a high risk study,in view of decreased EF. No significant change from 01/17/2013. Endoscopy-doesn't remember year.  Stress EKG 08/12/11 Frequent PVC: rest, initial exercise, recovery. Less at peak. 7.3 MET. Rehab OK  Cardioversion 01/11/2011: A. Fib to NSR(4 shocks). Back in A. Fib 01/26/12 OV.  Doppler Ultrasound  LE arterial duplex 11/29/12: No hemodynamically significant stenosis is identified on either side. This exam reveals mildly decreased perfusion of the right lower extremity, noted at the post tibial artery level.This exam reveals mildly decreased perfusion of the left lower extremity, noted at the post tibial artery level. LABI 0.88 and RABI 0.95. Study suggests mild diffuse disease. Colonoscopy 01/24/12, 2007.  ECG 05/22/12: A. Fibrillation, V rate 75/min. RBBB. No ischemia.  Echo 11/11/11: Low normal LVEF 50-55%. CRO inf, inf-lat hypo.Marked biatrial enlargement. Mar LAE  LE doppler 11/29/2012 No hemodynamically significant stenosis is identified on either side. This exam reveals mildly decreased perfusion of the right lower extremity, noted at the post tibial artery level.This exam reveals mildly decreased perfusion of the left lower extremity, noted at the post tibial artery level. LABI 0.88 and RABI 0.95. Study suggests mild diffuse disease. Echocardiogram 08/20/2014 Hospital: Left ventricle: The cavity size was normal. Systolic function was mildly to moderately reduced. The estimated ejection fraction was in the range of 40% to 45%. Akinesis of the  inferior myocardium. The study is not technically sufficient to allow evaluation of LV diastolic function. - Ventricular septum: Septal motion showed paradox. - Aortic valve: There was trivial regurgitation. - Left atrium: The atrium was moderately to severely dilated. - Right atrium: The atrium was mildly dilated. - Atrial septum: No defect or patent foramen ovale was  identified  Other Problems [name]; 02/04/2016 10:03 AM) Left renal mass h/o Left nephrectomy 11/29/11.  Admitted to [facility] on 10/27/2011 and discharged on 10/29/2011 with atrial fibrillation.  H/O colonic mild diverticulosis.     Review of Systems Alan Page, MD; 02/04/2016 5:51 PM) General Present- Fatigue (chronic). Not Present- Anorexia and Weight Gain. HEENT Not Present- Blurred Vision and Headache. Respiratory Present- Difficulty Breathing on Exertion (chronic and stable. No PND or orthopnea). Not Present- Dyspnea, Hemoptysis and Wakes up from Sleep Wheezing or Short of Breath. Cardiovascular Present- Swelling of Extremities. Not Present- Chest Pain, Claudications, Fainting, Leg Cramps and Palpitations. Gastrointestinal Not Present- Abdominal Pain, Black, Tarry Stool, Bloody Stool and Difficulty Swallowing. Musculoskeletal Present- Joint Pain (hands and wrists). Not Present- Leg Weakness. Neurological Not Present- Focal Neurological Symptoms. Endocrine Not Present- Cold Intolerance, Heat Intolerance, Polydipsia and Polyuria. Hematology Not Present- Abnormal Bleeding, Easy Bleeding and Easy Bruising. All other systems negative  Vitals Alan Page MD; 02/04/2016 10:40 AM) 02/04/2016 10:03 AM Weight: 197.5 lb Height: 72in Body Surface Area: 2.12 m Body Mass Index: 26.79 kg/m  Pulse: 98 (Irregular)  P.OX: 99% (Room air) BP: 106/72 (Sitting, Left Arm, Standard)       Physical Exam Alan Page MD; 02/04/2016 10:40 AM) General Mental Status-Alert. General  Appearance-Cooperative, Appears stated age, Not in acute distress. Orientation-Oriented X3. Build & Nutrition-Well built and Well nourished.  Head and Neck Thyroid Gland Characteristics - no palpable nodules, no palpable enlargement.  Chest and Lung Exam Palpation Tender - No chest wall tenderness. Auscultation Breath sounds - Clear.  Cardiovascular Inspection Jugular vein - Right - No Distention. Auscultation Heart Sounds - S1 is variable(and irregular), S2 WNL and No gallop present. Murmurs & Other Heart Sounds - Murmur - No murmur.  Abdomen Palpation/Percussion Normal exam - Non Tender and No hepatosplenomegaly. Auscultation Normal exam - Bowel sounds normal.  Peripheral Vascular Lower Extremity Inspection - Bilateral - Loss of hair and Varicose veins. Palpation - Edema - Bilateral - Trace edema. Femoral pulse - Bilateral - Normal. Popliteal pulse - Bilateral - Normal. Dorsalis pedis pulse - Bilateral - Normal. Posterior tibial pulse - Bilateral - Normal. Carotid arteries - Bilateral-No Carotid bruit. Abdomen-No prominent abdominal aortic pulsation, No epigastric bruit.  Neurologic Motor-Grossly intact without any focal deficits.  Musculoskeletal Global Assessment Left Lower Extremity - normal range of motion without pain. Right Lower Extremity - normal range of motion without pain.   Assessment & Plan ([name]; 02/04/2016 11:01 AM) Chronic atrial fibrillation (I48.2) Story: CHA2DS2-VASc Score is 6 with yearly risk of stroke of 9.8%. 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 Patient's choice. Impression: EKG 02/04/2016: Atrial fibrillation with controlled response at the rate of 95 bpm, right axis deviation, left posterior fascicular block, right bundle branch block. Nonspecific T abnormality. No significant change from EKG  12/29/2015. Current Plans Complete electrocardiogram (93000) Changed Metoprolol Tartrate 25MG, 1 Tablet three times daily, #210, 02/04/2016, Ref. x1. 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 Mild to moderate concentric hypertrophy of the left ventricle. Mild to moderate generalized hypokinesis, inferior akinesis. Mid to distal lateral hypokinesis. Left ventricular systolic function markedly rest. Visual LVEF 25-30%. Calculated EF 24%. Unable to evaluate diastolic function due to A. Fibrillation. Left atrial cavity is severely dilated. Right atrial cavity is slightly dilated.  Right ventricle cavity is mild to moderately dilated. Moderately reduced right ventricular function. Mild aortic regurgitation. Mild mitral regurgitation. Mild to moderate tricuspid regurgitation. Moderate pulmonary hypertension. Pulmonary artery systolic pressure is estimated at 45 mm Hg. IVC is dilated with a respiratory response of <50%. COmpared to the study done on 10/11/2013, EF decreased from 45%. Bilateral leg edema (R60.0) Cor pulmonale (chronic) (I27.81) Story: COPD and echo revealing RV dilatation. Atherosclerosis of native coronary artery of native heart without angina pectoris (I25.10) Story: CAD/ASHD H/O NSTEMI S/P CABGs on 06/28/2011. LIMA to LAD, SVG to circumflex, sequential SVG to PDA and PL of RCA. Alan Mons, MD  Lexiscan stress 01/17/13; 1. Resting EKG shows A. Fibrillation with RVR @ 107/min. RBBB. Non specific ST-T change. PVC. Stress EKG was non diagnostic for ischemia. No ST-T changes of ischemia noted with pharmacologic stress testing. 2. The perfusion study demonstrated a large sized inferior and inferolateral scar from the base to the apex. There was no significant peri-infarct ischemia. Left ventricular ejection fraction was estimated to be 19%. However the EF estimation may be erroneous due to  difficulty in QGS gating due to A. Fibrillation. This represents a low risk study. Clinical correlation recommended.   Labwork Story: Labs 02/19/2016: Serum glucose 104 mg, BUN 16, serum creatinine 1.07, eGFR 67 mL.  Hemoglobin 15.7/hematocrit 44.7.  Mild macrocytosis noted.  Platelets mildly reduced at 129.  Pro time 10.5 seconds, normal. 01/12/2016: Glucose 169 (nonfasting), creatinine 0.9, potassium 4.9, PLT 136, CBC otherwise normal.  10/17/2015: HbA1c 6.0%. Serum glucose 90 mg. BUN 19, serum creatinine 1.26, eGFR 56 mL. Potassium 4.5. CMP otherwise normal. Total cholesterol 120, triglycerides 210, HDL 26, LDL 52. Non-HDL cholesterol 94. HbA1c 6.7%. BUN 21, serum. 1.26, eGFR 56 mL. CMP otherwise normal.  08/26/2014: BUN 17, serum creatinine 1.11. EGFR 64 mL. Sodium 132, minimally reduced. BNP was mildly elevated at 254.  Current Plans Mechanism of underlying disease process and action of medications discussed with the patient. I discussed primary/secondary prevention and also dietary counceling was done. Patient is here on a one-month office visit and follow-up of recent onset of worsening dyspnea on exertion and leg edema and heart failure. His echocardiogram clearly reveals change in the LVEF with global hypokinesis and inferior akinesis and EF around 25 but most 30% compared to previous 45%.  I discussed the findings of the echocardiogram the patient and his daughter at the bedside, I have recommended left and right heart catheterization to evaluate coronary anatomy and to evaluate for pulmonary hypertension. I suspect either nonischemic cardiomyopathy from ongoing A. fib with RVR versus progression of CAD in view of ongoing risk factors especially smoking. I will see him back after the test and make further examination.  Patient has had GI bleeding the past, I have discussed the findings that in case he has major vessel disease, he may need stenting and dual antiplatelet therapy and may increase  the risk of bleed. Recent labs within normal limits except for mildly reduced platelets. However if minor coronary artery disease is seen, will do medical therapy only. Office visit after the angiography. Schedule for cardiac catheterization, and possible angioplasty. We discussed regarding risks, benefits, alternatives to this including stress testing, CTA and continued medical therapy. Patient wants to proceed. Understands <1-2% risk of death, stroke, MI, urgent CABG, bleeding, infection, renal failure but not limited to these.  CC: Dr. Donnie Coffin.  Signed by Alan Page, MD (02/04/2016 5:52 PM)

## 2016-02-24 ENCOUNTER — Ambulatory Visit (HOSPITAL_COMMUNITY)
Admission: RE | Admit: 2016-02-24 | Discharge: 2016-02-24 | Disposition: A | Discharge status: Home / Self Care | Payer: Medicare Other | Source: Ambulatory Visit | Attending: Cardiology | Admitting: Cardiology

## 2016-02-24 ENCOUNTER — Encounter (HOSPITAL_COMMUNITY)
Admission: RE | Disposition: A | Discharge status: Home / Self Care | Payer: Self-pay | Source: Ambulatory Visit | Attending: Cardiology

## 2016-02-24 DIAGNOSIS — N182 Chronic kidney disease, stage 2 (mild): Secondary | ICD-10-CM | POA: Diagnosis not present

## 2016-02-24 DIAGNOSIS — I5043 Acute on chronic combined systolic (congestive) and diastolic (congestive) heart failure: Secondary | ICD-10-CM | POA: Insufficient documentation

## 2016-02-24 DIAGNOSIS — I255 Ischemic cardiomyopathy: Secondary | ICD-10-CM | POA: Diagnosis not present

## 2016-02-24 DIAGNOSIS — I130 Hypertensive heart and chronic kidney disease with heart failure and stage 1 through stage 4 chronic kidney disease, or unspecified chronic kidney disease: Secondary | ICD-10-CM | POA: Insufficient documentation

## 2016-02-24 DIAGNOSIS — Z951 Presence of aortocoronary bypass graft: Secondary | ICD-10-CM | POA: Insufficient documentation

## 2016-02-24 DIAGNOSIS — Z79899 Other long term (current) drug therapy: Secondary | ICD-10-CM | POA: Insufficient documentation

## 2016-02-24 DIAGNOSIS — E1122 Type 2 diabetes mellitus with diabetic chronic kidney disease: Secondary | ICD-10-CM | POA: Diagnosis not present

## 2016-02-24 DIAGNOSIS — E782 Mixed hyperlipidemia: Secondary | ICD-10-CM | POA: Diagnosis not present

## 2016-02-24 DIAGNOSIS — Z7982 Long term (current) use of aspirin: Secondary | ICD-10-CM | POA: Insufficient documentation

## 2016-02-24 DIAGNOSIS — Z905 Acquired absence of kidney: Secondary | ICD-10-CM | POA: Insufficient documentation

## 2016-02-24 DIAGNOSIS — I25118 Atherosclerotic heart disease of native coronary artery with other forms of angina pectoris: Secondary | ICD-10-CM | POA: Insufficient documentation

## 2016-02-24 DIAGNOSIS — I272 Other secondary pulmonary hypertension: Secondary | ICD-10-CM | POA: Insufficient documentation

## 2016-02-24 DIAGNOSIS — Z7984 Long term (current) use of oral hypoglycemic drugs: Secondary | ICD-10-CM | POA: Diagnosis not present

## 2016-02-24 DIAGNOSIS — I482 Chronic atrial fibrillation: Secondary | ICD-10-CM | POA: Diagnosis not present

## 2016-02-24 DIAGNOSIS — J449 Chronic obstructive pulmonary disease, unspecified: Secondary | ICD-10-CM | POA: Diagnosis not present

## 2016-02-24 DIAGNOSIS — I2582 Chronic total occlusion of coronary artery: Secondary | ICD-10-CM | POA: Insufficient documentation

## 2016-02-24 DIAGNOSIS — I5042 Chronic combined systolic (congestive) and diastolic (congestive) heart failure: Secondary | ICD-10-CM | POA: Diagnosis present

## 2016-02-24 DIAGNOSIS — F17210 Nicotine dependence, cigarettes, uncomplicated: Secondary | ICD-10-CM | POA: Insufficient documentation

## 2016-02-24 DIAGNOSIS — I5041 Acute combined systolic (congestive) and diastolic (congestive) heart failure: Secondary | ICD-10-CM | POA: Diagnosis not present

## 2016-02-24 DIAGNOSIS — I252 Old myocardial infarction: Secondary | ICD-10-CM | POA: Insufficient documentation

## 2016-02-24 HISTORY — PX: CARDIAC CATHETERIZATION: SHX172

## 2016-02-24 LAB — POCT I-STAT 3, ART BLOOD GAS (G3+)
ACID-BASE DEFICIT: 3 mmol/L — AB (ref 0.0–2.0)
Acid-base deficit: 2 mmol/L (ref 0.0–2.0)
Bicarbonate: 22.2 mEq/L (ref 20.0–24.0)
Bicarbonate: 24.3 mEq/L — ABNORMAL HIGH (ref 20.0–24.0)
O2 Saturation: 57 %
O2 Saturation: 93 %
PCO2 ART: 40 mmHg (ref 35.0–45.0)
PH ART: 7.335 — AB (ref 7.350–7.450)
PO2 ART: 69 mmHg — AB (ref 80.0–100.0)
TCO2: 26 mmol/L (ref 0–100)
TCO2: 23 mmol/L (ref 0–100)
pCO2 arterial: 45.6 mmHg — ABNORMAL HIGH (ref 35.0–45.0)
pH, Arterial: 7.354 (ref 7.350–7.450)
pO2, Arterial: 32 mmHg — CL (ref 80.0–100.0)

## 2016-02-24 LAB — GLUCOSE, CAPILLARY
GLUCOSE-CAPILLARY: 137 mg/dL — AB (ref 65–99)
Glucose-Capillary: 106 mg/dL — ABNORMAL HIGH (ref 65–99)

## 2016-02-24 SURGERY — RIGHT/LEFT HEART CATH AND CORONARY/GRAFT ANGIOGRAPHY
Anesthesia: LOCAL

## 2016-02-24 MED ORDER — SODIUM CHLORIDE 0.9% FLUSH: 3.0000 mL | Freq: Two times a day (BID) | INTRAVENOUS | Status: DC

## 2016-02-24 MED ORDER — IOPAMIDOL (ISOVUE-370) INJECTION 76%
INTRAVENOUS | Status: AC
  Filled 2016-02-24: qty 125

## 2016-02-24 MED ORDER — NITROGLYCERIN 1 MG/10 ML FOR IR/CATH LAB
INTRA_ARTERIAL | Status: AC
Start: 2016-02-24 — End: 2016-02-24
  Filled 2016-02-24: qty 10

## 2016-02-24 MED ORDER — LIDOCAINE HCL (PF) 1 % IJ SOLN
INTRAMUSCULAR | Status: DC | PRN
  Administered 2016-02-24: 30 mL via INTRADERMAL

## 2016-02-24 MED ORDER — LIDOCAINE HCL (PF) 1 % IJ SOLN
INTRAMUSCULAR | Status: AC
  Filled 2016-02-24: qty 30

## 2016-02-24 MED ORDER — SODIUM CHLORIDE 0.9 % IV SOLN: 250.0000 mL | INTRAVENOUS | Status: DC | PRN

## 2016-02-24 MED ORDER — SODIUM CHLORIDE 0.9 % IV SOLN
INTRAVENOUS | Status: DC
  Administered 2016-02-24: 10:00:00 via INTRAVENOUS

## 2016-02-24 MED ORDER — SODIUM CHLORIDE 0.9% FLUSH: 3.0000 mL | INTRAVENOUS | Status: DC | PRN

## 2016-02-24 MED ORDER — MIDAZOLAM HCL 2 MG/2ML IJ SOLN
INTRAMUSCULAR | Status: AC
  Filled 2016-02-24: qty 2

## 2016-02-24 MED ORDER — MIDAZOLAM HCL 2 MG/2ML IJ SOLN
INTRAMUSCULAR | Status: DC | PRN
  Administered 2016-02-24: 2 mg via INTRAVENOUS

## 2016-02-24 MED ORDER — VERAPAMIL HCL 2.5 MG/ML IV SOLN
INTRAVENOUS | Status: AC
Start: 2016-02-24 — End: 2016-02-24
  Filled 2016-02-24: qty 2

## 2016-02-24 MED ORDER — METFORMIN HCL 1000 MG PO TABS: 1000.0000 mg | ORAL_TABLET | Freq: Two times a day (BID) | ORAL | Status: DC

## 2016-02-24 MED ORDER — FENTANYL CITRATE (PF) 100 MCG/2ML IJ SOLN
INTRAMUSCULAR | Status: AC
  Filled 2016-02-24: qty 2

## 2016-02-24 MED ORDER — FENTANYL CITRATE (PF) 100 MCG/2ML IJ SOLN
INTRAMUSCULAR | Status: DC | PRN
  Administered 2016-02-24: 50 ug via INTRAVENOUS

## 2016-02-24 MED ORDER — SODIUM CHLORIDE 0.9 % WEIGHT BASED INFUSION: 3.0000 mL/kg/h | INTRAVENOUS | Status: AC

## 2016-02-24 MED ORDER — HEPARIN (PORCINE) IN NACL 2-0.9 UNIT/ML-% IJ SOLN
INTRAMUSCULAR | Status: AC
  Filled 2016-02-24: qty 1000

## 2016-02-24 MED ORDER — HEPARIN SODIUM (PORCINE) 1000 UNIT/ML IJ SOLN
INTRAMUSCULAR | Status: AC
  Filled 2016-02-24: qty 1

## 2016-02-24 MED ORDER — ASPIRIN 81 MG PO CHEW: 81.0000 mg | CHEWABLE_TABLET | ORAL | Status: DC

## 2016-02-24 MED ORDER — IOPAMIDOL (ISOVUE-370) INJECTION 76%
INTRAVENOUS | Status: DC | PRN
  Administered 2016-02-24: 55 mL via INTRA_ARTERIAL

## 2016-02-24 MED ORDER — HEPARIN (PORCINE) IN NACL 2-0.9 UNIT/ML-% IJ SOLN
INTRAMUSCULAR | Status: DC | PRN
  Administered 2016-02-24: 1000 mL

## 2016-02-24 SURGICAL SUPPLY — 16 items
CATH BALLN WEDGE 5F 110CM (CATHETERS) ×2 IMPLANT
CATH INFINITI 5 FR IM (CATHETERS) ×2 IMPLANT
CATH INFINITI 5FR ANG PIGTAIL (CATHETERS) ×2 IMPLANT
CATH INFINITI 5FR MPB2 (CATHETERS) ×2 IMPLANT
CATH SWAN GANZ 7F STRAIGHT (CATHETERS) ×2 IMPLANT
GLIDESHEATH SLEND A-KIT 6F 20G (SHEATH) ×2 IMPLANT
GUIDEWIRE .025 260CM (WIRE) ×2 IMPLANT
KIT HEART LEFT (KITS) ×2 IMPLANT
PACK CARDIAC CATHETERIZATION (CUSTOM PROCEDURE TRAY) ×2 IMPLANT
SHEATH FAST CATH BRACH 5F 5CM (SHEATH) ×2 IMPLANT
SHEATH PINNACLE 5F 10CM (SHEATH) ×6 IMPLANT
SHEATH PINNACLE 7F 10CM (SHEATH) ×2 IMPLANT
TRANSDUCER W/STOPCOCK (MISCELLANEOUS) ×2 IMPLANT
TUBING CIL FLEX 10 FLL-RA (TUBING) ×2 IMPLANT
WIRE MICROINTRODUCER 60CM (WIRE) ×2 IMPLANT
WIRE SAFE-T 1.5MM-J .035X260CM (WIRE) ×2 IMPLANT

## 2016-02-24 NOTE — Interval H&P Note (Signed)
History and Physical Interval Note:  02/24/2016 9:52 AM  [name]  has presented today for surgery, with the diagnosis of CAD, sob, afib, chf  The various methods of treatment have been discussed with the patient and family. After consideration of risks, benefits and other options for treatment, the patient has consented to  Procedure(s): Right/Left Heart Cath and Coronary/Graft Angiography (N/A) and possible PCI as a surgical intervention .  The patient's history has been reviewed, patient examined, no change in status, stable for surgery.  I have reviewed the patient's chart and labs.  Questions were answered to the patient's satisfaction.   Ischemic Symptoms? CCS III (Marked limitation of ordinary activity) Anti-ischemic Medical Therapy? Minimal Therapy (1 class of medications) Non-invasive Test Results? Low-risk stress test findings: cardiac mortality <1%/year Prior CABG? Previous CABG   Patient Information:   >=1 SVG stenosis  U (6)  Indication: 50; Score: 6   Patient Information:   All bypass grafts patent, >=1 lesion(s) in native coronaries without bypass grafts  U (6)  Indication: 50; Score: 6   Patient Information:   Native 3V-CAD, failure of multiple grafts, depressed LVEF, patent LIMA graft PCI  U (6)  Indication: 68; Score: 6   Patient Information:   Native 3V-CAD, failure of multiple grafts, depressed LVEF, patent LIMA graft CABG  A (7)  Indication: 68; Score: 7   Patient Information:   Native 3V-CAD, failure of multiple grafts, depressed LVEF, nonfunctional LIMA graft PCI  A (8)  Indication: 69; Score: 8   Patient Information:   Native 3V-CAD, failure of multiple grafts, depressed LVEF, nonfunctional LIMA graft CABG  U (6)  Indication: 69; Score: 6   [name]

## 2016-02-24 NOTE — Discharge Instructions (Signed)
Angiogram, Care After °Refer to this sheet in the next few weeks. These instructions provide you with information about caring for yourself after your procedure. Your health care provider may also give you more specific instructions. Your treatment has been planned according to current medical practices, but problems sometimes occur. Call your health care provider if you have any problems or questions after your procedure. °WHAT TO EXPECT AFTER THE PROCEDURE °After your procedure, it is typical to have the following: °· Bruising at the catheter insertion site that usually fades within 1-2 weeks. °· Blood collecting in the tissue (hematoma) that may be painful to the touch. It should usually decrease in size and tenderness within 1-2 weeks. °HOME CARE INSTRUCTIONS °· Take medicines only as directed by your health care provider. °· You may shower 24-48 hours after the procedure or as directed by your health care provider. Remove the bandage (dressing) and gently wash the site with plain soap and water. Pat the area dry with a clean towel. Do not rub the site, because this may cause bleeding. °· Do not take baths, swim, or use a hot tub until your health care provider approves. °· Check your insertion site every day for redness, swelling, or drainage. °· Do not apply powder or lotion to the site. °· Do not lift over 10 lb (4.5 kg) for 5 days after your procedure or as directed by your health care provider. °· Ask your health care provider when it is okay to: °¨ Return to work or school. °¨ Resume usual physical activities or sports. °¨ Resume sexual activity. °· Do not drive home if you are discharged the same day as the procedure. Have someone else drive you. °· You may drive 24 hours after the procedure unless otherwise instructed by your health care provider. °· Do not operate machinery or power tools for 24 hours after the procedure or as directed by your health care provider. °· If your procedure was done as an  outpatient procedure, which means that you went home the same day as your procedure, a responsible adult should be with you for the first 24 hours after you arrive home. °· Keep all follow-up visits as directed by your health care provider. This is important. °SEEK MEDICAL CARE IF: °· You have a fever. °· You have chills. °· You have increased bleeding from the catheter insertion site. Hold pressure on the site. °SEEK IMMEDIATE MEDICAL CARE IF: °· You have unusual pain at the catheter insertion site. °· You have redness, warmth, or swelling at the catheter insertion site. °· You have drainage (other than a small amount of blood on the dressing) from the catheter insertion site. °· The catheter insertion site is bleeding, and the bleeding does not stop after 30 minutes of holding steady pressure on the site. °· The area near or just beyond the catheter insertion site becomes pale, cool, tingly, or numb. °  °This information is not intended to replace advice given to you by your health care provider. Make sure you discuss any questions you have with your health care provider. °  °Document Released: 03/18/2005 Document Revised: 09/20/2014 Document Reviewed: 01/31/2013 °Elsevier Interactive Patient Education ©2016 [employer] ° °

## 2016-02-24 NOTE — Progress Notes (Addendum)
Site area: RFA/RFV Site Prior to Removal:  Level 0 Pressure Applied For:20MIN Manual: yes   Patient Status During Pull:  stable Post Pull Site:  Level 0 Post Pull Instructions Given:  yes Post Pull Pulses Present: palpable Dressing Applied:tegaderm   Bedrest begins @ 1200 till 1600 Comments:

## 2016-02-25 ENCOUNTER — Encounter (HOSPITAL_COMMUNITY): Payer: Self-pay | Admitting: Cardiology

## 2016-02-25 MED FILL — Nitroglycerin IV Soln 100 MCG/ML in D5W: INTRA_ARTERIAL | Qty: 10 | Status: AC

## 2016-02-25 MED FILL — Verapamil HCl IV Soln 2.5 MG/ML: INTRAVENOUS | Qty: 2 | Status: AC

## 2016-03-03 ENCOUNTER — Encounter (HOSPITAL_COMMUNITY): Payer: Self-pay | Admitting: Cardiology

## 2016-03-03 DIAGNOSIS — I482 Chronic atrial fibrillation: Secondary | ICD-10-CM | POA: Diagnosis not present

## 2016-03-03 DIAGNOSIS — I5042 Chronic combined systolic (congestive) and diastolic (congestive) heart failure: Secondary | ICD-10-CM | POA: Diagnosis not present

## 2016-03-03 DIAGNOSIS — R600 Localized edema: Secondary | ICD-10-CM | POA: Diagnosis not present

## 2016-03-03 DIAGNOSIS — I2781 Cor pulmonale (chronic): Secondary | ICD-10-CM | POA: Diagnosis not present

## 2016-03-15 DIAGNOSIS — I482 Chronic atrial fibrillation: Secondary | ICD-10-CM | POA: Diagnosis not present

## 2016-03-18 DIAGNOSIS — R600 Localized edema: Secondary | ICD-10-CM | POA: Diagnosis not present

## 2016-03-18 DIAGNOSIS — I482 Chronic atrial fibrillation: Secondary | ICD-10-CM | POA: Diagnosis not present

## 2016-03-18 DIAGNOSIS — I2781 Cor pulmonale (chronic): Secondary | ICD-10-CM | POA: Diagnosis not present

## 2016-03-18 DIAGNOSIS — I5042 Chronic combined systolic (congestive) and diastolic (congestive) heart failure: Secondary | ICD-10-CM | POA: Diagnosis not present

## 2016-04-01 DIAGNOSIS — I482 Chronic atrial fibrillation: Secondary | ICD-10-CM | POA: Diagnosis not present

## 2016-04-01 DIAGNOSIS — I2781 Cor pulmonale (chronic): Secondary | ICD-10-CM | POA: Diagnosis not present

## 2016-04-01 DIAGNOSIS — I5042 Chronic combined systolic (congestive) and diastolic (congestive) heart failure: Secondary | ICD-10-CM | POA: Diagnosis not present

## 2016-04-01 DIAGNOSIS — I2510 Atherosclerotic heart disease of native coronary artery without angina pectoris: Secondary | ICD-10-CM | POA: Diagnosis not present

## 2016-04-12 DIAGNOSIS — I5042 Chronic combined systolic (congestive) and diastolic (congestive) heart failure: Secondary | ICD-10-CM | POA: Diagnosis not present

## 2016-04-20 DIAGNOSIS — M1990 Unspecified osteoarthritis, unspecified site: Secondary | ICD-10-CM | POA: Diagnosis not present

## 2016-04-20 DIAGNOSIS — I2510 Atherosclerotic heart disease of native coronary artery without angina pectoris: Secondary | ICD-10-CM | POA: Diagnosis not present

## 2016-04-20 DIAGNOSIS — F320 Major depressive disorder, single episode, mild: Secondary | ICD-10-CM | POA: Diagnosis not present

## 2016-04-20 DIAGNOSIS — I509 Heart failure, unspecified: Secondary | ICD-10-CM | POA: Diagnosis not present

## 2016-04-20 DIAGNOSIS — E7800 Pure hypercholesterolemia, unspecified: Secondary | ICD-10-CM | POA: Diagnosis not present

## 2016-04-20 DIAGNOSIS — R5383 Other fatigue: Secondary | ICD-10-CM | POA: Diagnosis not present

## 2016-04-20 DIAGNOSIS — E1149 Type 2 diabetes mellitus with other diabetic neurological complication: Secondary | ICD-10-CM | POA: Diagnosis not present

## 2016-04-22 DIAGNOSIS — I482 Chronic atrial fibrillation: Secondary | ICD-10-CM | POA: Diagnosis not present

## 2016-04-22 DIAGNOSIS — I2510 Atherosclerotic heart disease of native coronary artery without angina pectoris: Secondary | ICD-10-CM | POA: Diagnosis not present

## 2016-04-22 DIAGNOSIS — I2781 Cor pulmonale (chronic): Secondary | ICD-10-CM | POA: Diagnosis not present

## 2016-04-22 DIAGNOSIS — I5042 Chronic combined systolic (congestive) and diastolic (congestive) heart failure: Secondary | ICD-10-CM | POA: Diagnosis not present

## 2016-05-28 DIAGNOSIS — S50319A Abrasion of unspecified elbow, initial encounter: Secondary | ICD-10-CM | POA: Diagnosis not present

## 2016-06-18 DIAGNOSIS — Z23 Encounter for immunization: Secondary | ICD-10-CM | POA: Diagnosis not present

## 2016-07-21 DIAGNOSIS — I5042 Chronic combined systolic (congestive) and diastolic (congestive) heart failure: Secondary | ICD-10-CM | POA: Diagnosis not present

## 2016-07-21 DIAGNOSIS — I482 Chronic atrial fibrillation: Secondary | ICD-10-CM | POA: Diagnosis not present

## 2016-07-21 DIAGNOSIS — I2781 Cor pulmonale (chronic): Secondary | ICD-10-CM | POA: Diagnosis not present

## 2016-07-21 DIAGNOSIS — I2510 Atherosclerotic heart disease of native coronary artery without angina pectoris: Secondary | ICD-10-CM | POA: Diagnosis not present

## 2016-07-23 ENCOUNTER — Encounter: Payer: Self-pay | Admitting: Podiatry

## 2016-07-23 ENCOUNTER — Ambulatory Visit (INDEPENDENT_AMBULATORY_CARE_PROVIDER_SITE_OTHER): Payer: Medicare Other | Admitting: Podiatry

## 2016-07-23 VITALS — BP 113/75 | HR 65 | Resp 16 | Ht 72.0 in | Wt 190.0 lb

## 2016-07-23 DIAGNOSIS — M79605 Pain in left leg: Secondary | ICD-10-CM

## 2016-07-23 DIAGNOSIS — M79676 Pain in unspecified toe(s): Secondary | ICD-10-CM

## 2016-07-23 DIAGNOSIS — M79604 Pain in right leg: Secondary | ICD-10-CM | POA: Diagnosis not present

## 2016-07-23 DIAGNOSIS — B351 Tinea unguium: Secondary | ICD-10-CM

## 2016-07-23 NOTE — Progress Notes (Signed)
   Subjective:    Patient ID: [name], male    DOB: 11/10/1940, 75 y.o.   MRN: [id number]  HPI  Chief Complaint  Patient presents with  . Debridement    Bilateral nail trim; pt diabetic; sugar=did not check today; A1C=7.3  . Nail Problem    Bilateral; pt stated, "wants nails checked for nail fungus"      Review of Systems  Constitutional: Positive for diaphoresis.  Cardiovascular: Positive for chest pain and leg swelling.  Neurological: Positive for dizziness and headaches.  All other systems reviewed and are negative.      Objective:   Physical Exam        Assessment & Plan:

## 2016-07-24 NOTE — Progress Notes (Signed)
Subjective:     Patient ID: GREAT ISOBE, male   DOB: May 23, 1941, 75 y.o.   MRN: [id number]  HPI patient presents with caregiver with severe nail disease 1-5 both feet with incurvation and pain. He also sees and interventional cardiologist checks his circulation on a relatively regular basis at this time and has long-term history of diabetes   Review of Systems  All other systems reviewed and are negative.      Objective:   Physical Exam  Constitutional: He is oriented to person, place, and time.  Cardiovascular: Intact distal pulses.   Musculoskeletal: Normal range of motion.  Neurological: He is oriented to person, place, and time.  Skin: Skin is warm and dry.  Nursing note and vitals reviewed.  neurovascular status was found to be diminished but intact and unchanged with this being watched by a interventional cardiologist with patient noted to have severe nail disease 1-5 both feet with incurvation and pain. He has no open sores or other structural changes and does have mild varicosities in the ankle region bilateral. Patient's found to have good digital perfusion and is well oriented and also has caregiver     Assessment:     Severe nail disease 1-5 both feet with incurvation of the beds and pain when palpated    Plan:     H&P condition reviewed and debridement of nailbeds 1-5 both feet accomplished with no iatrogenic bleeding noted. Patient will be seen back for is to recheck him regular visit and was given his correction is on diabetic care and inspections

## 2016-07-28 DIAGNOSIS — I5042 Chronic combined systolic (congestive) and diastolic (congestive) heart failure: Secondary | ICD-10-CM | POA: Diagnosis not present

## 2016-10-22 ENCOUNTER — Other Ambulatory Visit: Payer: Medicare Other

## 2016-10-28 DIAGNOSIS — I2781 Cor pulmonale (chronic): Secondary | ICD-10-CM | POA: Diagnosis not present

## 2016-10-28 DIAGNOSIS — R600 Localized edema: Secondary | ICD-10-CM | POA: Diagnosis not present

## 2016-10-28 DIAGNOSIS — E7800 Pure hypercholesterolemia, unspecified: Secondary | ICD-10-CM | POA: Diagnosis not present

## 2016-10-28 DIAGNOSIS — I482 Chronic atrial fibrillation: Secondary | ICD-10-CM | POA: Diagnosis not present

## 2016-11-02 DIAGNOSIS — I119 Hypertensive heart disease without heart failure: Secondary | ICD-10-CM | POA: Diagnosis not present

## 2016-11-02 DIAGNOSIS — E7800 Pure hypercholesterolemia, unspecified: Secondary | ICD-10-CM | POA: Diagnosis not present

## 2016-11-02 DIAGNOSIS — Z0000 Encounter for general adult medical examination without abnormal findings: Secondary | ICD-10-CM | POA: Diagnosis not present

## 2016-11-02 DIAGNOSIS — E1149 Type 2 diabetes mellitus with other diabetic neurological complication: Secondary | ICD-10-CM | POA: Diagnosis not present

## 2016-12-09 ENCOUNTER — Ambulatory Visit (INDEPENDENT_AMBULATORY_CARE_PROVIDER_SITE_OTHER): Payer: Medicare Other | Admitting: Podiatry

## 2016-12-09 ENCOUNTER — Encounter: Payer: Self-pay | Admitting: Podiatry

## 2016-12-09 DIAGNOSIS — M79604 Pain in right leg: Secondary | ICD-10-CM | POA: Diagnosis not present

## 2016-12-09 DIAGNOSIS — B351 Tinea unguium: Secondary | ICD-10-CM

## 2016-12-09 DIAGNOSIS — M79605 Pain in left leg: Secondary | ICD-10-CM | POA: Diagnosis not present

## 2016-12-09 NOTE — Progress Notes (Signed)
Subjective:     Patient ID: [name], male   DOB: 09-15-1940, 76 y.o.   MRN: [id number]  HPI patient presents with significant nail disease 1-5 both feet that are thick and impossible for him to cut and painful   Review of Systems     Objective:   Physical Exam Neurovascular status intact with thick yellow brittle nailbeds 1-5 both feet    Assessment:     Mycotic nail infection with pain 1-5 both feet    Plan:     Debris painful nail beds 1-5 both feet with no iatrogenic bleeding noted

## 2017-01-07 DIAGNOSIS — C642 Malignant neoplasm of left kidney, except renal pelvis: Secondary | ICD-10-CM | POA: Diagnosis not present

## 2017-01-14 ENCOUNTER — Ambulatory Visit (HOSPITAL_COMMUNITY)
Admission: RE | Admit: 2017-01-14 | Discharge: 2017-01-14 | Disposition: A | Discharge status: Home / Self Care | Payer: Medicare Other | Source: Ambulatory Visit | Attending: Urology | Admitting: Urology

## 2017-01-14 ENCOUNTER — Other Ambulatory Visit (HOSPITAL_COMMUNITY): Payer: Self-pay | Admitting: Urology

## 2017-01-14 DIAGNOSIS — C642 Malignant neoplasm of left kidney, except renal pelvis: Secondary | ICD-10-CM | POA: Insufficient documentation

## 2017-01-14 DIAGNOSIS — J849 Interstitial pulmonary disease, unspecified: Secondary | ICD-10-CM | POA: Diagnosis not present

## 2017-01-14 DIAGNOSIS — I517 Cardiomegaly: Secondary | ICD-10-CM | POA: Diagnosis not present

## 2017-01-14 DIAGNOSIS — J90 Pleural effusion, not elsewhere classified: Secondary | ICD-10-CM | POA: Insufficient documentation

## 2017-01-31 DIAGNOSIS — I482 Chronic atrial fibrillation: Secondary | ICD-10-CM | POA: Diagnosis not present

## 2017-01-31 DIAGNOSIS — E7800 Pure hypercholesterolemia, unspecified: Secondary | ICD-10-CM | POA: Diagnosis not present

## 2017-02-10 DIAGNOSIS — I5042 Chronic combined systolic (congestive) and diastolic (congestive) heart failure: Secondary | ICD-10-CM | POA: Diagnosis not present

## 2017-02-10 DIAGNOSIS — I2510 Atherosclerotic heart disease of native coronary artery without angina pectoris: Secondary | ICD-10-CM | POA: Diagnosis not present

## 2017-02-10 DIAGNOSIS — I482 Chronic atrial fibrillation: Secondary | ICD-10-CM | POA: Diagnosis not present

## 2017-02-10 DIAGNOSIS — I2781 Cor pulmonale (chronic): Secondary | ICD-10-CM | POA: Diagnosis not present

## 2017-03-10 ENCOUNTER — Ambulatory Visit: Payer: Medicare Other | Admitting: Podiatry

## 2017-05-12 DIAGNOSIS — I482 Chronic atrial fibrillation: Secondary | ICD-10-CM | POA: Diagnosis not present

## 2017-05-12 DIAGNOSIS — I2781 Cor pulmonale (chronic): Secondary | ICD-10-CM | POA: Diagnosis not present

## 2017-05-12 DIAGNOSIS — I5042 Chronic combined systolic (congestive) and diastolic (congestive) heart failure: Secondary | ICD-10-CM | POA: Diagnosis not present

## 2017-05-12 DIAGNOSIS — I2510 Atherosclerotic heart disease of native coronary artery without angina pectoris: Secondary | ICD-10-CM | POA: Diagnosis not present

## 2017-07-18 ENCOUNTER — Encounter: Payer: Medicare Other | Admitting: Podiatry

## 2017-07-18 ENCOUNTER — Ambulatory Visit: Payer: Medicare Other

## 2017-07-26 DIAGNOSIS — E7800 Pure hypercholesterolemia, unspecified: Secondary | ICD-10-CM | POA: Diagnosis not present

## 2017-07-26 DIAGNOSIS — E1149 Type 2 diabetes mellitus with other diabetic neurological complication: Secondary | ICD-10-CM | POA: Diagnosis not present

## 2017-07-26 DIAGNOSIS — I509 Heart failure, unspecified: Secondary | ICD-10-CM | POA: Diagnosis not present

## 2017-07-26 DIAGNOSIS — I2510 Atherosclerotic heart disease of native coronary artery without angina pectoris: Secondary | ICD-10-CM | POA: Diagnosis not present

## 2017-09-26 ENCOUNTER — Telehealth (INDEPENDENT_AMBULATORY_CARE_PROVIDER_SITE_OTHER): Payer: Self-pay

## 2017-09-26 NOTE — Telephone Encounter (Signed)
Friday 09/23/2017 Patient called stating that he was upset that the scheduled home care services had not yet come out to his home, and that he wanted to see if we could find out what was going on and he wanted to see if the home care services was going to be [facility] or [facility]?  In calling both services I found that he should and would be receiving services from [facility]. I called to the Home office and spoke to the nurse manager and she then let me speak to the nurse dispatcher, and I was told at that time the name of [name]'s nurse and I was told that she was going out to see him by the end of business that day.   The patient was happy and he thanked me for getting to the bottom of his problem and confusion.

## 2017-09-26 NOTE — Telephone Encounter (Signed)
Note entered in error previously. Should be on Gordie Crumby -[phone].

## 2017-11-10 DIAGNOSIS — I482 Chronic atrial fibrillation: Secondary | ICD-10-CM | POA: Diagnosis not present

## 2017-11-10 DIAGNOSIS — I5042 Chronic combined systolic (congestive) and diastolic (congestive) heart failure: Secondary | ICD-10-CM | POA: Diagnosis not present

## 2017-11-10 DIAGNOSIS — E7800 Pure hypercholesterolemia, unspecified: Secondary | ICD-10-CM | POA: Diagnosis not present

## 2017-11-10 DIAGNOSIS — I2510 Atherosclerotic heart disease of native coronary artery without angina pectoris: Secondary | ICD-10-CM | POA: Diagnosis not present

## 2017-11-10 DIAGNOSIS — I2781 Cor pulmonale (chronic): Secondary | ICD-10-CM | POA: Diagnosis not present

## 2017-12-23 NOTE — Progress Notes (Signed)
This encounter was created in error - please disregard.

## 2018-01-17 DIAGNOSIS — E1149 Type 2 diabetes mellitus with other diabetic neurological complication: Secondary | ICD-10-CM | POA: Diagnosis not present

## 2018-01-17 DIAGNOSIS — E7800 Pure hypercholesterolemia, unspecified: Secondary | ICD-10-CM | POA: Diagnosis not present

## 2018-01-17 DIAGNOSIS — I509 Heart failure, unspecified: Secondary | ICD-10-CM | POA: Diagnosis not present

## 2018-01-17 DIAGNOSIS — I2510 Atherosclerotic heart disease of native coronary artery without angina pectoris: Secondary | ICD-10-CM | POA: Diagnosis not present

## 2018-02-03 DIAGNOSIS — R05 Cough: Secondary | ICD-10-CM | POA: Diagnosis not present

## 2018-06-26 DIAGNOSIS — I2510 Atherosclerotic heart disease of native coronary artery without angina pectoris: Secondary | ICD-10-CM | POA: Diagnosis not present

## 2018-06-26 DIAGNOSIS — I5042 Chronic combined systolic (congestive) and diastolic (congestive) heart failure: Secondary | ICD-10-CM | POA: Diagnosis not present

## 2018-06-26 DIAGNOSIS — I4821 Permanent atrial fibrillation: Secondary | ICD-10-CM | POA: Diagnosis not present

## 2018-06-26 DIAGNOSIS — I2781 Cor pulmonale (chronic): Secondary | ICD-10-CM | POA: Diagnosis not present

## 2018-06-26 DIAGNOSIS — E7800 Pure hypercholesterolemia, unspecified: Secondary | ICD-10-CM | POA: Diagnosis not present

## 2018-07-10 DIAGNOSIS — I5042 Chronic combined systolic (congestive) and diastolic (congestive) heart failure: Secondary | ICD-10-CM | POA: Diagnosis not present

## 2018-09-29 DIAGNOSIS — E7800 Pure hypercholesterolemia, unspecified: Secondary | ICD-10-CM | POA: Diagnosis not present

## 2018-09-29 DIAGNOSIS — I5042 Chronic combined systolic (congestive) and diastolic (congestive) heart failure: Secondary | ICD-10-CM | POA: Diagnosis not present

## 2018-09-29 DIAGNOSIS — I2510 Atherosclerotic heart disease of native coronary artery without angina pectoris: Secondary | ICD-10-CM | POA: Diagnosis not present

## 2018-09-29 DIAGNOSIS — I2781 Cor pulmonale (chronic): Secondary | ICD-10-CM | POA: Diagnosis not present

## 2018-10-30 ENCOUNTER — Telehealth: Payer: Self-pay

## 2018-10-30 NOTE — Telephone Encounter (Signed)
Ask them to increase Furosemide 40 mg to twide daily for 3 days and if no better, to come in for check. Avoid crackers that he likes so much. After 3 days, lasix daily for 10 days then as needed daily.

## 2018-10-30 NOTE — Telephone Encounter (Signed)
Pt wife called about his feet/legs swollen and fluids coming out of leg. Pt has taken lasix past few days; she they come in for f/u

## 2018-11-02 ENCOUNTER — Other Ambulatory Visit: Payer: Self-pay | Admitting: Cardiology

## 2018-11-30 ENCOUNTER — Other Ambulatory Visit: Payer: Self-pay | Admitting: Cardiology

## 2018-12-14 ENCOUNTER — Other Ambulatory Visit: Payer: Self-pay | Admitting: Cardiology

## 2018-12-19 ENCOUNTER — Telehealth: Payer: Self-pay

## 2018-12-19 MED ORDER — TORSEMIDE 20 MG PO TABS: 20.0000 mg | ORAL_TABLET | Freq: Two times a day (BID) | ORAL | 1 refills | Status: AC

## 2018-12-19 NOTE — Telephone Encounter (Signed)
Call in for Torsemide 20 mg BID for 1 week then q daily

## 2018-12-19 NOTE — Telephone Encounter (Signed)
Daughter is aware. Torsemide sent to [facility].//ah

## 2018-12-19 NOTE — Telephone Encounter (Signed)
Pt's daughter called stating that pt is still having leg edema but is now having swelling in the thigh and groin area. He is taking lasix and spironolactone as directed. Please advise.//ah

## 2019-01-05 ENCOUNTER — Other Ambulatory Visit: Payer: Self-pay | Admitting: Cardiology

## 2019-01-05 DIAGNOSIS — E7800 Pure hypercholesterolemia, unspecified: Secondary | ICD-10-CM

## 2019-01-05 MED ORDER — ROSUVASTATIN CALCIUM 10 MG PO TABS: 10.0000 mg | ORAL_TABLET | Freq: Every day | ORAL | 1 refills | Status: DC

## 2019-01-08 NOTE — Telephone Encounter (Signed)
Pt daughter called today stating that the swelling is doing better but he is getting water blisters and one leg is worse than the other and she doesn't think that its infected she wants to know if maybe we can set up a VV or if she can send a picture she doesn't want to come in unless she has to; Please advise

## 2019-01-08 NOTE — Telephone Encounter (Signed)
VV?

## 2019-01-08 NOTE — Telephone Encounter (Signed)
Can you please call daughter carolyn at [phone] and set up a VV for this week Wed for [employer]. Leg swelling

## 2019-01-10 ENCOUNTER — Encounter: Payer: Self-pay | Admitting: Cardiology

## 2019-01-10 ENCOUNTER — Ambulatory Visit: Payer: Medicare Other | Admitting: Cardiology

## 2019-01-10 ENCOUNTER — Other Ambulatory Visit: Payer: Self-pay

## 2019-01-10 VITALS — BP 124/82 | HR 98 | Ht 72.0 in | Wt 170.0 lb

## 2019-01-10 DIAGNOSIS — R600 Localized edema: Secondary | ICD-10-CM | POA: Diagnosis not present

## 2019-01-10 DIAGNOSIS — I2510 Atherosclerotic heart disease of native coronary artery without angina pectoris: Secondary | ICD-10-CM | POA: Diagnosis not present

## 2019-01-10 DIAGNOSIS — F0150 Vascular dementia without behavioral disturbance: Secondary | ICD-10-CM

## 2019-01-10 DIAGNOSIS — I5043 Acute on chronic combined systolic (congestive) and diastolic (congestive) heart failure: Secondary | ICD-10-CM | POA: Diagnosis not present

## 2019-01-10 DIAGNOSIS — I4821 Permanent atrial fibrillation: Secondary | ICD-10-CM

## 2019-01-10 DIAGNOSIS — I10 Essential (primary) hypertension: Secondary | ICD-10-CM

## 2019-01-10 DIAGNOSIS — R627 Adult failure to thrive: Secondary | ICD-10-CM

## 2019-01-10 DIAGNOSIS — Z66 Do not resuscitate: Secondary | ICD-10-CM

## 2019-01-10 DIAGNOSIS — I2781 Cor pulmonale (chronic): Secondary | ICD-10-CM | POA: Diagnosis not present

## 2019-01-10 NOTE — Progress Notes (Signed)
Virtual Visit via Video Note: This visit type was conducted due to national recommendations for restrictions regarding the COVID-19 Pandemic (e.g. social distancing).  This format is felt to be most appropriate for this patient at this time.  All issues noted in this document were discussed and addressed.  No physical exam was performed (except for noted visual exam findings with Telehealth visits).  The patient has consented to conduct a Telehealth visit and understands insurance will be billed.   I connected with@, on 01/10/19 at  by a video enabled telemedicine application and verified that I am speaking with the correct person using two identifiers.   I discussed the limitations of evaluation and management by telemedicine and the availability of in person appointments. The patient expressed understanding and agreed to proceed.   I have discussed with patient regarding the safety during COVID Pandemic and steps and precautions to be taken including social distancing, frequent hand wash and use of detergent soap, gels with the patient. I asked the patient to avoid touching mouth, nose, eyes, ears with the hands. I encouraged regular walking around the neighborhood and exercise and regular diet, as long as social distancing can be maintained.   Primary Physician/Referring:  Alroy Dust, L.Marlou Sa, MD  Patient ID: [name], male    DOB: Jul 10, 1941, 78 y.o.   MRN: [id number]  Chief Complaint  Patient presents with  . Leg Swelling    HPI: [name]  is a 78 y.o. male  with permanent atrial fibrillation, not on anticoagulation due to recurrent GI bleed and thrombocytopenia, ischemic cardiomyopathy with severe LV systolic dysfunction, tobacco use disorder, chronic cor pulmonale, hypertension, hyperlipidemia, diet controlled diabetes mellitus and history of left nephrectomy in 2013 for renal cell carcinoma. He was evaluated by urology and told to be cancer free in May 2018.  This is a virtual  visit initiated by his daughter who is very involved due to worsening leg edema. Overall his health has deteriorated significantly, he does minimal activities at home. I had made him DO NOT RESUSCITATE, end-of-life issues have been discussed and MOST forms filled in Jan 2020. Daughter called our office to set up a virtual visit as he is having ulceration on his legs and wanted to discuss how to proceed with therapy.  Patient has deteriorated significantly over the past few weeks, has essentially become nearly nonverbal, nonmobile. They had called [place] last week and I had started Torsemide and there has been improvement in edema. He still has dyspnea.    Right calf open wound noted   Past Medical History:  Diagnosis Date  . Acute non-ST segment elevation myocardial infarction ([place])   . Arthritis    "hands" (09/16/2014)  . Blood transfusion    "related to surgeries"  . CAD (coronary artery disease)   . Cancer ([place])    kidney  . CHF (congestive heart failure) ([place])   . COPD (chronic obstructive pulmonary disease) ([place])   . Diverticulitis   . Diverticulitis 1996  . Dysrhythmia    A. Fib  . High cholesterol   . HTN (hypertension)   . Left renal mass   . Myocardial infarction ([place]) 06/19/2011  . Renal neoplasm    LEFT  . Type II diabetes mellitus ([place])    type II  . Ulcer 1960's   "stomach"    Past Surgical History:  Procedure Laterality Date  . APPENDECTOMY    . CARDIAC CATHETERIZATION N/A 02/24/2016   Procedure: Right/Left Heart Cath and Coronary/Graft  Angiography;  Surgeon: Adrian Prows, MD;  Location: [facility];  Service: Cardiovascular;  Laterality: N/A;  . CARDIOVERSION  01/11/2012   Procedure: CARDIOVERSION;  Surgeon: Laverda Page, MD;  Location: [facility];  Service: Cardiovascular;  Laterality: N/A;  . CATARACT EXTRACTION W/ INTRAOCULAR LENS  IMPLANT, BILATERAL Bilateral   . CHOLECYSTECTOMY  04/08/2015  . CHOLECYSTECTOMY  04/08/2015   Procedure: CHOLECYSTECTOMY;  Surgeon:  Armandina Gemma, MD;  Location: [facility];  Service: General;;  . COLON RESECTION  2005   FOR DIVERTICULITIS  . COLON SURGERY    . COLONOSCOPY  02/02/2012   Procedure: COLONOSCOPY;  Surgeon: Jeryl Columbia, MD;  Location: [facility];  Service: Endoscopy;  Laterality: N/A;  magod /ja  . CORONARY ARTERY BYPASS GRAFT  06/2011   4 VESSELS  . EXPLORATORY LAPAROTOMY  04/08/2015  . HERNIA REPAIR Bilateral X 4   "twice on each side"  . LAPAROSCOPIC LYSIS OF ADHESIONS  04/08/2015  . LAPAROTOMY N/A 04/08/2015   Procedure: EXPLORATORY LAPAROTOMY, REPAIR OF ENTEROTOMY;  Surgeon: Armandina Gemma, MD;  Location: [facility];  Service: General;  Laterality: N/A;  . LYSIS OF ADHESION  04/08/2015   Procedure: LYSIS OF ADHESION;  Surgeon: Armandina Gemma, MD;  Location: [facility];  Service: General;;  . ROBOT ASSISTED LAPAROSCOPIC NEPHRECTOMY  11/29/2011   Procedure: ROBOTIC ASSISTED LAPAROSCOPIC NEPHRECTOMY;  Surgeon: Dutch Gray, MD;  Location: [facility];  Service: Urology;  Laterality: Left;  PARTIAL left nephrectomy     Social History   Socioeconomic History  . Marital status: Married    Spouse name: Not on file  . Number of children: 3  . Years of education: Not on file  . Highest education level: Not on file  Occupational History    Employer: RETIRED  Social Needs  . Financial resource strain: Not on file  . Food insecurity:    Worry: Not on file    Inability: Not on file  . Transportation needs:    Medical: Not on file    Non-medical: Not on file  Tobacco Use  . Smoking status: Current Every Day Smoker    Packs/day: 0.50    Years: 52.00    Pack years: 26.00    Types: Cigarettes  . Smokeless tobacco: Never Used  Substance and Sexual Activity  . Alcohol use: No  . Drug use: No  . Sexual activity: Not Currently  Lifestyle  . Physical activity:    Days per week: Not on file    Minutes per session: Not on file  . Stress: Not on file  Relationships  . Social connections:    Talks on phone: Not on file    Gets  together: Not on file    Attends religious service: Not on file    Active member of club or organization: Not on file    Attends meetings of clubs or organizations: Not on file    Relationship status: Not on file  . Intimate partner violence:    Fear of current or ex partner: Not on file    Emotionally abused: Not on file    Physically abused: Not on file    Forced sexual activity: Not on file  Other Topics Concern  . Not on file  Social History Narrative  . Not on file    Current Outpatient Medications on File Prior to Visit  Medication Sig Dispense Refill  . albuterol (PROVENTIL HFA;VENTOLIN HFA) 108 (90 BASE) MCG/ACT inhaler Inhale 1-2 puffs into the lungs every  4 (four) hours as needed for wheezing or shortness of breath.    . Carboxymethylcell-Hypromellose (GENTEAL OP) Place 1 drop into both eyes as needed (dry eye).     . fluticasone (FLOVENT HFA) 110 MCG/ACT inhaler Inhale 1 puff into the lungs 2 (two) times daily. (Patient taking differently: Inhale 1 puff into the lungs 2 (two) times daily as needed. ) 1 Inhaler 3  . ketoconazole (NIZORAL) 2 % cream Apply 1 application topically daily as needed for irritation (On feet). Apply to rash on feet    . montelukast (SINGULAIR) 10 MG tablet Take 10 mg by mouth at bedtime.    . nitroGLYCERIN (NITROSTAT) 0.4 MG SL tablet Place 0.4 mg under the tongue every 5 (five) minutes x 3 doses as needed for chest pain. For chest pain    . potassium chloride (KLOR-CON) 8 MEQ tablet TAKE 2 TABLETS BY MOUTH DAILY WITH FUROSEMIDE 60 tablet 1  . sertraline (ZOLOFT) 100 MG tablet Take 100 mg by mouth daily.     Marland Kitchen spironolactone (ALDACTONE) 25 MG tablet Take 25 mg by mouth daily.    Marland Kitchen torsemide (DEMADEX) 20 MG tablet Take 1 tablet (20 mg total) by mouth 2 (two) times daily. (Patient taking differently: Take 20 mg by mouth daily. ) 180 tablet 1  . vitamin C (ASCORBIC ACID) 500 MG tablet Take 500 mg by mouth daily.     No current facility-administered  medications on file prior to visit.    Review of Systems  Constitution: Positive for malaise/fatigue. Negative for chills, decreased appetite and weight gain.  Cardiovascular: Positive for dyspnea on exertion and leg swelling. Negative for paroxysmal nocturnal dyspnea and syncope.  Respiratory: Positive for cough (chronic). Negative for hemoptysis and sleep disturbances due to breathing.   Endocrine: Negative for cold intolerance.  Hematologic/Lymphatic: Does not bruise/bleed easily.  Skin:       Ulcer right leg and smaller ulcers left following leg edema  Musculoskeletal: Negative for joint swelling.  Gastrointestinal: Negative for abdominal pain, anorexia and change in bowel habit.  Neurological: Positive for dizziness and weakness. Negative for headaches and light-headedness.  Psychiatric/Behavioral: Negative for depression and substance abuse.  All other systems reviewed and are negative.     Objective  Blood pressure 124/82, pulse 98, height 6' (1.829 m), weight 170 lb (77.1 kg). Body mass index is 23.06 kg/m.   Physical exam not performed or limited due to virtual visit. Please see exam details from prior visit is as below. Patient was bedridden, did not verbalize any complaints.  He did not appear to be any distress.  His wife, daughter present.  Radiology: No results found.  Laboratory examination:   Labs 06/26/2018: TSH normal, HB 14.5/HCT 42.5, platelets 116.  Serum glucose 103 mg, BUN 17, creatinine 1.04, eGFR greater than 60 mL.  Total cholesterol 107, triglycerides 136, HDL 31, LDL 49.  Cardiac Studies:    Echocardiogram  07/10/2018: Left ventricle cavity is normal in size. Mild concentric hypertrophy of the left ventricle. Indeterminate diastolic filling pattern due to A. Fib. . Left ventricle regional wall motion findings: Basal inferolateral, Mid anterolateral, Mid inferolateral, Apical lateral and Apical inferior akinesis. Visual EF is 35-40%. Calculated EF 32%.  Left atrial cavity is severely dilated at 5.5 cm. Right atrial cavity is moderately dilated. Right ventricle cavity is mildly dilated. Normal right ventricular function. Trileaflet aortic valve with mild (Grade I) regurgitation. Mild (Grade I) mitral regurgitation. Mild tricuspid regurgitation. Mild pulmonary hypertension. Estimated pulmonary artery systolic pressure 35  mmHg IVC is dilated with poor inspiration collapse consistent with elevated right atrial pressure. Compared to the study done on 07/28/2016, no significant change.  Assessment   Acute on chronic combined systolic and diastolic CHF (congestive heart failure) ([place]) - Plan: Ambulatory referral to Hospice  Bilateral leg edema  CAD native vessel without angina: CABG on 06/28/2011: Coronary angio 02/24/2016: Patent LIMA to LAD. Occluded SVG to RCA and OM. Mid Cx 80% tandem. Diffuse RCA.  - Plan: Ambulatory referral to Hospice  Cor pulmonale (chronic) ([place])  Permanent atrial fibrillation CHA2DS2-VASc Score is 6 with yearly risk of stroke of 9.8%.   Essential hypertension, benign  DNR (do not resuscitate)  Failure to thrive in adult  Vascular dementia without behavioral disturbance ([place]) - Plan: Ambulatory referral to Hospice  EKG 06/26/2018: Atrial fibrillation with controlled and corresponds at rate of 74 bpm, right axis deviation, right bundle branch block.  Nonspecific T normality.  Normal QT interval. No significant change from EKG 11/10/2017  Recommendations:   I reviewed heart failure symptoms and also open wound noted on the leg.  The wound appears to be fairly deep although noninfected.  We could certainly consider referral to wound center and aggressive management.  He has end-stage cardiomyopathy, also developed worsening dementia and he is presently essentially bedridden and mostly nonverbal with no significant quality of life.  After discussion with the patient's daughter and family, I felt it is appropriate  to consider comfort care/palliative care.  All family members are agreeable to this.  I have discontinued statins, fenofibrate, metoprolol, aspirin and also metformin and kept only diuretics on board for comfort measures along with bronchodilator therapy.  I'll make a referral for hospice care.  Patient's family was very appreciative of the care provided to him through his medical issues dating back 8 years. This was a 30 minute face to face discussion regarding end of life and comfort measures in addition to 15 minutes of evaluation of medical problems and addressing edema, CHF and COPD. Please see attached picture of the open wound.   Adrian Prows, MD, [facility] 01/10/2019, 9:16 PM [facility] [place] Pager: [phone] Office: [phone] If no answer Cell [phone]

## 2019-02-12 DEATH — deceased

## 2019-03-30 ENCOUNTER — Ambulatory Visit: Payer: Self-pay | Admitting: Cardiology
